# Patient Record
Sex: Male | Born: 1980 | Race: Black or African American | Hispanic: No | Marital: Single | Smoking: Never smoker
Health system: Southern US, Community
[De-identification: ages and names within clinical notes are randomized; demographics above are authoritative.]

## PROBLEM LIST (undated history)

## (undated) ENCOUNTER — Encounter

## (undated) ENCOUNTER — Telehealth: Attending: Dermatology | Primary: Dermatology

## (undated) ENCOUNTER — Telehealth

## (undated) ENCOUNTER — Ambulatory Visit: Payer: MEDICARE | Attending: Family Medicine | Primary: Family Medicine

## (undated) ENCOUNTER — Ambulatory Visit

## (undated) ENCOUNTER — Inpatient Hospital Stay

## (undated) ENCOUNTER — Telehealth: Attending: Family | Primary: Family

## (undated) ENCOUNTER — Ambulatory Visit: Payer: MEDICARE | Attending: Family | Primary: Family

## (undated) ENCOUNTER — Ambulatory Visit: Payer: MEDICARE | Attending: Dermatology | Primary: Dermatology

## (undated) ENCOUNTER — Ambulatory Visit: Payer: MEDICARE

## (undated) ENCOUNTER — Telehealth: Attending: Urology | Primary: Urology

## (undated) ENCOUNTER — Ambulatory Visit: Attending: Urology | Primary: Urology

## (undated) ENCOUNTER — Ambulatory Visit: Payer: MEDICARE | Attending: Urology | Primary: Urology

## (undated) ENCOUNTER — Ambulatory Visit: Attending: Internal Medicine | Primary: Internal Medicine

## (undated) ENCOUNTER — Encounter: Attending: Adult Health | Primary: Adult Health

## (undated) ENCOUNTER — Encounter: Attending: Family | Primary: Family

## (undated) ENCOUNTER — Ambulatory Visit: Attending: Surgery | Primary: Surgery

## (undated) ENCOUNTER — Ambulatory Visit: Payer: MEDICARE | Attending: Surgery | Primary: Surgery

## (undated) DIAGNOSIS — L0291 Cutaneous abscess, unspecified: Secondary | ICD-10-CM

## (undated) DIAGNOSIS — L732 Hidradenitis suppurativa: Secondary | ICD-10-CM

## (undated) HISTORY — PX: INCISION AND DRAINAGE: SHX5863

## (undated) HISTORY — PX: HERNIA REPAIR: SHX51

## (undated) MED ORDER — OXYCODONE 15 MG TABLET: Freq: Three times a day (TID) | ORAL | 0 days | Status: SS | PRN

## (undated) MED ORDER — OXYCODONE ER 20 MG TABLET,CRUSH RESISTANT,EXTENDED RELEASE 12 HR: Freq: Two times a day (BID) | ORAL | 0.00000 days | Status: SS

## (undated) MED ORDER — AMOXICILLIN 875 MG-POTASSIUM CLAVULANATE 125 MG TABLET
Freq: Two times a day (BID) | ORAL | 0.00000 days | Status: SS
Start: ? — End: 2020-08-23

---
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
## 2014-09-04 ENCOUNTER — Emergency Department (HOSPITAL_COMMUNITY)
Admission: EM | Admit: 2014-09-04 | Discharge: 2014-09-04 | Disposition: A | Discharge status: Home / Self Care | Payer: Self-pay | Attending: Emergency Medicine | Admitting: Emergency Medicine

## 2014-09-04 ENCOUNTER — Encounter (HOSPITAL_COMMUNITY): Payer: Self-pay | Admitting: Nurse Practitioner

## 2014-09-04 ENCOUNTER — Emergency Department (HOSPITAL_COMMUNITY): Payer: Self-pay

## 2014-09-04 DIAGNOSIS — L0231 Cutaneous abscess of buttock: Secondary | ICD-10-CM | POA: Insufficient documentation

## 2014-09-04 DIAGNOSIS — N492 Inflammatory disorders of scrotum: Secondary | ICD-10-CM | POA: Insufficient documentation

## 2014-09-04 DIAGNOSIS — L0291 Cutaneous abscess, unspecified: Secondary | ICD-10-CM

## 2014-09-04 DIAGNOSIS — L02214 Cutaneous abscess of groin: Secondary | ICD-10-CM | POA: Insufficient documentation

## 2014-09-04 LAB — I-STAT CHEM 8, ED
BUN: 12 mg/dL (ref 6–23)
CALCIUM ION: 1.21 mmol/L (ref 1.12–1.23)
Chloride: 102 mmol/L (ref 96–112)
Creatinine, Ser: 1.1 mg/dL (ref 0.50–1.35)
GLUCOSE: 92 mg/dL (ref 70–99)
HCT: 41 % (ref 39.0–52.0)
Hemoglobin: 13.9 g/dL (ref 13.0–17.0)
POTASSIUM: 3.5 mmol/L (ref 3.5–5.1)
Sodium: 142 mmol/L (ref 135–145)
TCO2: 23 mmol/L (ref 0–100)

## 2014-09-04 LAB — CBC WITH DIFFERENTIAL/PLATELET
Basophils Absolute: 0 10*3/uL (ref 0.0–0.1)
Basophils Relative: 0 % (ref 0–1)
EOS ABS: 0.1 10*3/uL (ref 0.0–0.7)
Eosinophils Relative: 2 % (ref 0–5)
HCT: 36.1 % — ABNORMAL LOW (ref 39.0–52.0)
HEMOGLOBIN: 11.2 g/dL — AB (ref 13.0–17.0)
LYMPHS ABS: 1.3 10*3/uL (ref 0.7–4.0)
LYMPHS PCT: 14 % (ref 12–46)
MCH: 21.8 pg — AB (ref 26.0–34.0)
MCHC: 31 g/dL (ref 30.0–36.0)
MCV: 70.2 fL — ABNORMAL LOW (ref 78.0–100.0)
MONOS PCT: 9 % (ref 3–12)
Monocytes Absolute: 0.8 10*3/uL (ref 0.1–1.0)
NEUTROS ABS: 7.1 10*3/uL (ref 1.7–7.7)
Neutrophils Relative %: 76 % (ref 43–77)
PLATELETS: 342 10*3/uL (ref 150–400)
RBC: 5.14 MIL/uL (ref 4.22–5.81)
RDW: 14.6 % (ref 11.5–15.5)
WBC: 9.4 10*3/uL (ref 4.0–10.5)

## 2014-09-04 MED ORDER — LORAZEPAM 2 MG/ML IJ SOLN
1.0000 mg | Freq: Once | INTRAMUSCULAR | Status: AC
  Administered 2014-09-04: 1 mg via INTRAVENOUS
  Filled 2014-09-04: qty 1

## 2014-09-04 MED ORDER — KETOROLAC TROMETHAMINE 30 MG/ML IJ SOLN
30.0000 mg | Freq: Once | INTRAMUSCULAR | Status: AC
  Administered 2014-09-04: 30 mg via INTRAVENOUS
  Filled 2014-09-04: qty 1

## 2014-09-04 MED ORDER — HYDROMORPHONE HCL 1 MG/ML IJ SOLN
1.0000 mg | Freq: Once | INTRAMUSCULAR | Status: AC
  Administered 2014-09-04: 1 mg via INTRAVENOUS
  Filled 2014-09-04: qty 1

## 2014-09-04 MED ORDER — LIDOCAINE-EPINEPHRINE (PF) 2 %-1:200000 IJ SOLN
10.0000 mL | Freq: Once | INTRAMUSCULAR | Status: AC
  Administered 2014-09-04: 10 mL
  Filled 2014-09-04: qty 20

## 2014-09-04 MED ORDER — IOHEXOL 300 MG/ML  SOLN
100.0000 mL | Freq: Once | INTRAMUSCULAR | Status: AC | PRN
  Administered 2014-09-04: 100 mL via INTRAVENOUS

## 2014-09-04 MED ORDER — OXYCODONE-ACETAMINOPHEN 5-325 MG PO TABS: 1.0000 | ORAL_TABLET | ORAL | Status: DC | PRN

## 2014-09-04 MED ORDER — HYDROMORPHONE HCL 1 MG/ML IJ SOLN
0.5000 mg | Freq: Once | INTRAMUSCULAR | Status: AC
  Administered 2014-09-04: 0.5 mg via INTRAVENOUS
  Filled 2014-09-04: qty 1

## 2014-09-04 MED ORDER — ONDANSETRON HCL 4 MG/2ML IJ SOLN
4.0000 mg | Freq: Once | INTRAMUSCULAR | Status: AC
  Administered 2014-09-04: 4 mg via INTRAVENOUS
  Filled 2014-09-04: qty 2

## 2014-09-04 MED ORDER — DOXYCYCLINE HYCLATE 100 MG PO CAPS: 100.0000 mg | ORAL_CAPSULE | Freq: Two times a day (BID) | ORAL | Status: DC

## 2014-09-04 NOTE — ED Provider Notes (Signed)
CSN: [id number]     Arrival date & time 09/04/14  1408 History   First MD Initiated Contact with Patient 09/04/14 1442     Chief Complaint  Patient presents with  . Wound Infection     (Consider location/radiation/quality/duration/timing/severity/associated sxs/prior Treatment) HPI Comments: Patient here with multiple abscesses in his left groin and left buttock which is been draining for the past 2 weeks and worse over the last day. Denies any fever or chills. No vomiting. Has self treated himself with soaks in his bathtub without relief. Has had sharp pain is worse with movement and better with rest at these areas. Is not on antibiotics currently at this time.  The history is provided by the patient.    History reviewed. No pertinent past medical history. History reviewed. No pertinent past surgical history. History reviewed. No pertinent family history. History  Substance Use Topics  . Smoking status: Never Smoker   . Smokeless tobacco: Not on file  . Alcohol Use: No    Review of Systems  All other systems reviewed and are negative.     Allergies  Review of patient's allergies indicates no known allergies.  Home Medications   Prior to Admission medications   Not on File   BP 136/88 mmHg  Pulse 74  Temp(Src) 97.6 F (36.4 C) (Oral)  Resp 20  Ht  (1.778 m)  Wt 210 lb (95.255 kg)  BMI 30.13 kg/m2  SpO2 100% Physical Exam  Constitutional: He is oriented to person, place, and time. He appears well-developed and well-nourished.  Non-toxic appearance. No distress.  HENT:  Head: Normocephalic and atraumatic.  Eyes: Conjunctivae, EOM and lids are normal. Pupils are equal, round, and reactive to light.  Neck: Normal range of motion. Neck supple. No tracheal deviation present. No thyroid mass present.  Cardiovascular: Normal rate, regular rhythm and normal heart sounds.  Exam reveals no gallop.   No murmur heard. Pulmonary/Chest: Effort normal and breath sounds  normal. No stridor. No respiratory distress. He has no decreased breath sounds. He has no wheezes. He has no rhonchi. He has no rales.  Abdominal: Soft. Normal appearance and bowel sounds are normal. He exhibits no distension. There is no tenderness. There is no rebound and no CVA tenderness.  Genitourinary:        Musculoskeletal: Normal range of motion. He exhibits no edema or tenderness.  Neurological: He is alert and oriented to person, place, and time. He has normal strength. No cranial nerve deficit or sensory deficit. GCS eye subscore is 4. GCS verbal subscore is 5. GCS motor subscore is 6.  Skin: Skin is warm and dry. No abrasion and no rash noted.  Psychiatric: He has a normal mood and affect. His speech is normal and behavior is normal.  Nursing note and vitals reviewed.   ED Course  Procedures (including critical care time) Labs Review Labs Reviewed  CBC WITH DIFFERENTIAL/PLATELET - Abnormal; Notable for the following:    Hemoglobin 11.2 (*)    HCT 36.1 (*)    MCV 70.2 (*)    MCH 21.8 (*)    All other components within normal limits  I-STAT CHEM 8, ED    Imaging Review No results found.   EKG Interpretation None      MDM   Final diagnoses:  None   Wounds drained by resident please see his note. Will place on antibiotics and give prescription for pain medication due to cellulitis that is present as well 2.  Toy BakerAnthony T Mekhia Brogan, MD 09/04/14 2227

## 2014-09-04 NOTE — Discharge Instructions (Signed)

## 2014-09-04 NOTE — ED Notes (Signed)
He states he has an abscess on his buttock and groin that are painful and draining fluid this week

## 2014-09-04 NOTE — ED Provider Notes (Signed)
Medical screening exam in [facility].    Dan Koch is a 34 y.o. male with a h/o abscess who presents to the Emergency Department complaining of "boils". He states the first boil to his buttocks went away and  returned 2 weeks ago. He reports 10/10 sharp pain and states it is draining pus and blood from the site. He notes the draining has provided mild relief. The second boil to his groin appeared last night and has gotten bigger since. He states it feels the same as the first. He denies fever, chills, dysuria, penile discharge and penile pain, changes in BM and blood in his stool.   Brief Physical Exam: Patient with multiple large abscess that appear to be continuous starting in the left buttock and track through to the left groin and left scrotum.  Multiple areas draining pus.    Pt will definitely need higher level of care that we are able to provide in fast track.  Moved to main ED for CT pelvis with contrast, further evaluation and treatment.  Labs, IV pain and nausea medications ordered.     Trixie Dredgemily Avrianna Smart, PA-C 09/04/14 1547  Tilden FossaElizabeth Rees, MD 09/04/14 1740

## 2014-09-04 NOTE — ED Notes (Signed)
Physician at the bedside performing I&D.

## 2014-09-07 ENCOUNTER — Telehealth: Payer: Self-pay | Admitting: *Deleted

## 2014-09-07 NOTE — Telephone Encounter (Signed)
Pt called stating prescription for doxycycline (VIBRAMYCIN) 100 MG capsule was too expensive.  NCM searched www.goodrx.com to get coupon for $18.85 and text to pt phone.  Pt appreciative.

## 2014-09-14 ENCOUNTER — Emergency Department (HOSPITAL_COMMUNITY)
Admission: EM | Admit: 2014-09-14 | Discharge: 2014-09-14 | Disposition: A | Discharge status: Home / Self Care | Payer: No Typology Code available for payment source | Attending: Emergency Medicine | Admitting: Emergency Medicine

## 2014-09-14 ENCOUNTER — Emergency Department (HOSPITAL_COMMUNITY): Payer: No Typology Code available for payment source

## 2014-09-14 ENCOUNTER — Encounter (HOSPITAL_COMMUNITY): Payer: Self-pay

## 2014-09-14 DIAGNOSIS — S4991XA Unspecified injury of right shoulder and upper arm, initial encounter: Secondary | ICD-10-CM | POA: Insufficient documentation

## 2014-09-14 DIAGNOSIS — Y998 Other external cause status: Secondary | ICD-10-CM | POA: Diagnosis not present

## 2014-09-14 DIAGNOSIS — S0990XA Unspecified injury of head, initial encounter: Secondary | ICD-10-CM | POA: Insufficient documentation

## 2014-09-14 DIAGNOSIS — Y92410 Unspecified street and highway as the place of occurrence of the external cause: Secondary | ICD-10-CM | POA: Diagnosis not present

## 2014-09-14 DIAGNOSIS — Z793 Long term (current) use of hormonal contraceptives: Secondary | ICD-10-CM | POA: Diagnosis not present

## 2014-09-14 DIAGNOSIS — S3992XA Unspecified injury of lower back, initial encounter: Secondary | ICD-10-CM | POA: Insufficient documentation

## 2014-09-14 DIAGNOSIS — S6991XA Unspecified injury of right wrist, hand and finger(s), initial encounter: Secondary | ICD-10-CM | POA: Insufficient documentation

## 2014-09-14 DIAGNOSIS — Y9389 Activity, other specified: Secondary | ICD-10-CM | POA: Diagnosis not present

## 2014-09-14 DIAGNOSIS — S8991XA Unspecified injury of right lower leg, initial encounter: Secondary | ICD-10-CM | POA: Insufficient documentation

## 2014-09-14 MED ORDER — ONDANSETRON 4 MG PO TBDP
8.0000 mg | ORAL_TABLET | Freq: Once | ORAL | Status: AC
  Administered 2014-09-14: 8 mg via ORAL
  Filled 2014-09-14: qty 2

## 2014-09-14 MED ORDER — OXYCODONE-ACETAMINOPHEN 5-325 MG PO TABS
2.0000 | ORAL_TABLET | Freq: Once | ORAL | Status: AC
  Administered 2014-09-14: 2 via ORAL
  Filled 2014-09-14: qty 2

## 2014-09-14 MED ORDER — OXYCODONE-ACETAMINOPHEN 5-325 MG PO TABS: 2.0000 | ORAL_TABLET | Freq: Four times a day (QID) | ORAL | Status: DC | PRN

## 2014-09-14 NOTE — Discharge Instructions (Signed)
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
## 2014-09-15 ENCOUNTER — Emergency Department (HOSPITAL_COMMUNITY)
Admission: EM | Admit: 2014-09-15 | Discharge: 2014-09-15 | Disposition: A | Discharge status: Home / Self Care | Payer: No Typology Code available for payment source | Attending: Emergency Medicine | Admitting: Emergency Medicine

## 2014-09-15 ENCOUNTER — Encounter (HOSPITAL_COMMUNITY): Payer: Self-pay | Admitting: *Deleted

## 2014-09-15 DIAGNOSIS — G44209 Tension-type headache, unspecified, not intractable: Secondary | ICD-10-CM | POA: Diagnosis not present

## 2014-09-15 DIAGNOSIS — Z792 Long term (current) use of antibiotics: Secondary | ICD-10-CM | POA: Insufficient documentation

## 2014-09-15 DIAGNOSIS — M549 Dorsalgia, unspecified: Secondary | ICD-10-CM | POA: Diagnosis not present

## 2014-09-15 DIAGNOSIS — M542 Cervicalgia: Secondary | ICD-10-CM

## 2014-09-15 MED ORDER — TRAMADOL HCL 50 MG PO TABS: 50.0000 mg | ORAL_TABLET | Freq: Four times a day (QID) | ORAL | Status: DC | PRN

## 2014-09-15 MED ORDER — METHOCARBAMOL 500 MG PO TABS
1000.0000 mg | ORAL_TABLET | Freq: Once | ORAL | Status: AC
  Administered 2014-09-15: 1000 mg via ORAL
  Filled 2014-09-15: qty 2

## 2014-09-15 MED ORDER — OXYCODONE-ACETAMINOPHEN 5-325 MG PO TABS
1.0000 | ORAL_TABLET | Freq: Once | ORAL | Status: AC
  Administered 2014-09-15: 1 via ORAL
  Filled 2014-09-15: qty 1

## 2014-09-15 MED ORDER — NAPROXEN 500 MG PO TABS: 500.0000 mg | ORAL_TABLET | Freq: Two times a day (BID) | ORAL | Status: DC

## 2014-09-15 MED ORDER — NAPROXEN 250 MG PO TABS
500.0000 mg | ORAL_TABLET | Freq: Two times a day (BID) | ORAL | Status: DC
  Administered 2014-09-15: 500 mg via ORAL
  Filled 2014-09-15: qty 2

## 2014-09-15 MED ORDER — METHOCARBAMOL 750 MG PO TABS: 750.0000 mg | ORAL_TABLET | Freq: Four times a day (QID) | ORAL | Status: DC | PRN
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
## 2014-09-15 NOTE — ED Notes (Signed)
Pt a/o x 4 on d/c with steady gait. Pt refused wheelchair. 
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
## 2014-09-17 ENCOUNTER — Encounter (HOSPITAL_COMMUNITY): Payer: Self-pay | Admitting: Emergency Medicine

## 2014-09-17 ENCOUNTER — Emergency Department (HOSPITAL_COMMUNITY)
Admission: EM | Admit: 2014-09-17 | Discharge: 2014-09-17 | Disposition: A | Discharge status: Home / Self Care | Payer: No Typology Code available for payment source | Attending: Emergency Medicine | Admitting: Emergency Medicine

## 2014-09-17 DIAGNOSIS — M549 Dorsalgia, unspecified: Secondary | ICD-10-CM | POA: Insufficient documentation

## 2014-09-17 DIAGNOSIS — R51 Headache: Secondary | ICD-10-CM | POA: Insufficient documentation

## 2014-09-17 DIAGNOSIS — G8911 Acute pain due to trauma: Secondary | ICD-10-CM | POA: Insufficient documentation

## 2014-09-17 DIAGNOSIS — M25511 Pain in right shoulder: Secondary | ICD-10-CM | POA: Insufficient documentation

## 2014-09-17 DIAGNOSIS — M542 Cervicalgia: Secondary | ICD-10-CM | POA: Diagnosis present

## 2014-09-17 DIAGNOSIS — S161XXD Strain of muscle, fascia and tendon at neck level, subsequent encounter: Secondary | ICD-10-CM | POA: Diagnosis not present

## 2014-09-17 DIAGNOSIS — Z792 Long term (current) use of antibiotics: Secondary | ICD-10-CM | POA: Insufficient documentation

## 2014-09-17 MED ORDER — IBUPROFEN 800 MG PO TABS: 800.0000 mg | ORAL_TABLET | Freq: Three times a day (TID) | ORAL | Status: DC

## 2014-09-17 MED ORDER — BUPIVACAINE HCL (PF) 0.5 % IJ SOLN
5.0000 mL | Freq: Once | INTRAMUSCULAR | Status: DC
Start: 2014-09-17 — End: 2014-09-17
  Filled 2014-09-17: qty 10

## 2014-09-17 MED ORDER — LIDOCAINE HCL 2 % IJ SOLN
5.0000 mL | Freq: Once | INTRAMUSCULAR | Status: DC
  Filled 2014-09-17: qty 20

## 2014-09-17 MED ORDER — IBUPROFEN 400 MG PO TABS
800.0000 mg | ORAL_TABLET | Freq: Once | ORAL | Status: AC
  Administered 2014-09-17: 800 mg via ORAL
  Filled 2014-09-17: qty 2

## 2014-09-17 NOTE — Discharge Instructions (Signed)
Cervical Strain and Sprain (Whiplash) with Rehab Cervical strain and sprain are injuries that commonly occur with "whiplash" injuries. Whiplash occurs when the neck is forcefully whipped backward or forward, such as during a motor vehicle accident or during contact sports. The muscles, ligaments, tendons, discs, and nerves of the neck are susceptible to injury when this occurs. RISK FACTORS Risk of having a whiplash injury increases if:  Osteoarthritis of the spine.  Situations that make head or neck accidents or trauma more likely.  High-risk sports (football, rugby, wrestling, hockey, auto racing, gymnastics, diving, contact karate, or boxing).  Poor strength and flexibility of the neck.  Previous neck injury.  Poor tackling technique.  Improperly fitted or padded equipment. SYMPTOMS   Pain or stiffness in the front or back of neck or both.  Symptoms may present immediately or up to 24 hours after injury.  Dizziness, headache, nausea, and vomiting.  Muscle spasm with soreness and stiffness in the neck.  Tenderness and swelling at the injury site. PREVENTION  Learn and use proper technique (avoid tackling with the head, spearing, and head-butting; use proper falling techniques to avoid landing on the head).  Warm up and stretch properly before activity.  Maintain physical fitness:  Strength, flexibility, and endurance.  Cardiovascular fitness.  Wear properly fitted and padded protective equipment, such as padded soft collars, for participation in contact sports. PROGNOSIS  Recovery from cervical strain and sprain injuries is dependent on the extent of the injury. These injuries are usually curable in 1 week to 3 months with appropriate treatment.  RELATED COMPLICATIONS   Temporary numbness and weakness may occur if the nerve roots are damaged, and this may persist until the nerve has completely healed.  Chronic pain due to frequent recurrence of  symptoms.  Prolonged healing, especially if activity is resumed too soon (before complete recovery). TREATMENT  Treatment initially involves the use of ice and medication to help reduce pain and inflammation. It is also important to perform strengthening and stretching exercises and modify activities that worsen symptoms so the injury does not get worse. These exercises may be performed at home or with a therapist. For patients who experience severe symptoms, a soft, padded collar may be recommended to be worn around the neck.  Improving your posture may help reduce symptoms. Posture improvement includes pulling your chin and abdomen in while sitting or standing. If you are sitting, sit in a firm chair with your buttocks against the back of the chair. While sleeping, try replacing your pillow with a small towel rolled to 2 inches in diameter, or use a cervical pillow or soft cervical collar. Poor sleeping positions delay healing.  For patients with nerve root damage, which causes numbness or weakness, the use of a cervical traction apparatus may be recommended. Surgery is rarely necessary for these injuries. However, cervical strain and sprains that are present at birth (congenital) may require surgery. MEDICATION   If pain medication is necessary, nonsteroidal anti-inflammatory medications, such as aspirin and ibuprofen, or other minor pain relievers, such as acetaminophen, are often recommended.  Do not take pain medication for 7 days before surgery.  Prescription pain relievers may be given if deemed necessary by your caregiver. Use only as directed and only as much as you need. HEAT AND COLD:   Cold treatment (icing) relieves pain and reduces inflammation. Cold treatment should be applied for 10 to 15 minutes every 2 to 3 hours for inflammation and pain and immediately after any activity that aggravates   your symptoms. Use ice packs or an ice massage.  Heat treatment may be used prior to  performing the stretching and strengthening activities prescribed by your caregiver, physical therapist, or athletic trainer. Use a heat pack or a warm soak. SEEK MEDICAL CARE IF:   Symptoms get worse or do not improve in 2 weeks despite treatment.  New, unexplained symptoms develop (drugs used in treatment may produce side effects). EXERCISES RANGE OF MOTION (ROM) AND STRETCHING EXERCISES - Cervical Strain and Sprain These exercises may help you when beginning to rehabilitate your injury. In order to successfully resolve your symptoms, you must improve your posture. These exercises are designed to help reduce the forward-head and rounded-shoulder posture which contributes to this condition. Your symptoms may resolve with or without further involvement from your physician, physical therapist or athletic trainer. While completing these exercises, remember:   Restoring tissue flexibility helps normal motion to return to the joints. This allows healthier, less painful movement and activity.  An effective stretch should be held for at least 20 seconds, although you may need to begin with shorter hold times for comfort.  A stretch should never be painful. You should only feel a gentle lengthening or release in the stretched tissue. STRETCH- Axial Extensors  Lie on your back on the floor. You may bend your knees for comfort. Place a rolled-up hand towel or dish towel, about 2 inches in diameter, under the part of your head that makes contact with the floor.  Gently tuck your chin, as if trying to make a "double chin," until you feel a gentle stretch at the base of your head.  Hold __________ seconds. Repeat __________ times. Complete this exercise __________ times per day.  STRETCH - Axial Extension   Stand or sit on a firm surface. Assume a good posture: chest up, shoulders drawn back, abdominal muscles slightly tense, knees unlocked (if standing) and feet hip width apart.  Slowly retract your  chin so your head slides back and your chin slightly lowers. Continue to look straight ahead.  You should feel a gentle stretch in the back of your head. Be certain not to feel an aggressive stretch since this can cause headaches later.  Hold for __________ seconds. Repeat __________ times. Complete this exercise __________ times per day. STRETCH - Cervical Side Bend   Stand or sit on a firm surface. Assume a good posture: chest up, shoulders drawn back, abdominal muscles slightly tense, knees unlocked (if standing) and feet hip width apart.  Without letting your nose or shoulders move, slowly tip your right / left ear to your shoulder until your feel a gentle stretch in the muscles on the opposite side of your neck.  Hold __________ seconds. Repeat __________ times. Complete this exercise __________ times per day. STRETCH - Cervical Rotators   Stand or sit on a firm surface. Assume a good posture: chest up, shoulders drawn back, abdominal muscles slightly tense, knees unlocked (if standing) and feet hip width apart.  Keeping your eyes level with the ground, slowly turn your head until you feel a gentle stretch along the back and opposite side of your neck.  Hold __________ seconds. Repeat __________ times. Complete this exercise __________ times per day. RANGE OF MOTION - Neck Circles   Stand or sit on a firm surface. Assume a good posture: chest up, shoulders drawn back, abdominal muscles slightly tense, knees unlocked (if standing) and feet hip width apart.  Gently roll your head down and around from the   back of one shoulder to the back of the other. The motion should never be forced or painful.  Repeat the motion 10-20 times, or until you feel the neck muscles relax and loosen. Repeat __________ times. Complete the exercise __________ times per day. STRENGTHENING EXERCISES - Cervical Strain and Sprain These exercises may help you when beginning to rehabilitate your injury. They may  resolve your symptoms with or without further involvement from your physician, physical therapist, or athletic trainer. While completing these exercises, remember:   Muscles can gain both the endurance and the strength needed for everyday activities through controlled exercises.  Complete these exercises as instructed by your physician, physical therapist, or athletic trainer. Progress the resistance and repetitions only as guided.  You may experience muscle soreness or fatigue, but the pain or discomfort you are trying to eliminate should never worsen during these exercises. If this pain does worsen, stop and make certain you are following the directions exactly. If the pain is still present after adjustments, discontinue the exercise until you can discuss the trouble with your clinician. STRENGTH - Cervical Flexors, Isometric  Face a wall, standing about 6 inches away. Place a small pillow, a ball about 6-8 inches in diameter, or a folded towel between your forehead and the wall.  Slightly tuck your chin and gently push your forehead into the soft object. Push only with mild to moderate intensity, building up tension gradually. Keep your jaw and forehead relaxed.  Hold 10 to 20 seconds. Keep your breathing relaxed.  Release the tension slowly. Relax your neck muscles completely before you start the next repetition. Repeat __________ times. Complete this exercise __________ times per day. STRENGTH- Cervical Lateral Flexors, Isometric   Stand about 6 inches away from a wall. Place a small pillow, a ball about 6-8 inches in diameter, or a folded towel between the side of your head and the wall.  Slightly tuck your chin and gently tilt your head into the soft object. Push only with mild to moderate intensity, building up tension gradually. Keep your jaw and forehead relaxed.  Hold 10 to 20 seconds. Keep your breathing relaxed.  Release the tension slowly. Relax your neck muscles completely  before you start the next repetition. Repeat __________ times. Complete this exercise __________ times per day. STRENGTH - Cervical Extensors, Isometric   Stand about 6 inches away from a wall. Place a small pillow, a ball about 6-8 inches in diameter, or a folded towel between the back of your head and the wall.  Slightly tuck your chin and gently tilt your head back into the soft object. Push only with mild to moderate intensity, building up tension gradually. Keep your jaw and forehead relaxed.  Hold 10 to 20 seconds. Keep your breathing relaxed.  Release the tension slowly. Relax your neck muscles completely before you start the next repetition. Repeat __________ times. Complete this exercise __________ times per day. POSTURE AND BODY MECHANICS CONSIDERATIONS - Cervical Strain and Sprain Keeping correct posture when sitting, standing or completing your activities will reduce the stress put on different body tissues, allowing injured tissues a chance to heal and limiting painful experiences. The following are general guidelines for improved posture. Your physician or physical therapist will provide you with any instructions specific to your needs. While reading these guidelines, remember:  The exercises prescribed by your provider will help you have the flexibility and strength to maintain correct postures.  The correct posture provides the optimal environment for your joints to   work. All of your joints have less wear and tear when properly supported by a spine with good posture. This means you will experience a healthier, less painful body.  Correct posture must be practiced with all of your activities, especially prolonged sitting and standing. Correct posture is as important when doing repetitive low-stress activities (typing) as it is when doing a single heavy-load activity (lifting). PROLONGED STANDING WHILE SLIGHTLY LEANING FORWARD When completing a task that requires you to lean  forward while standing in one place for a long time, place either foot up on a stationary 2- to 4-inch high object to help maintain the best posture. When both feet are on the ground, the low back tends to lose its slight inward curve. If this curve flattens (or becomes too large), then the back and your other joints will experience too much stress, fatigue more quickly, and can cause pain.  RESTING POSITIONS Consider which positions are most painful for you when choosing a resting position. If you have pain with flexion-based activities (sitting, bending, stooping, squatting), choose a position that allows you to rest in a less flexed posture. You would want to avoid curling into a fetal position on your side. If your pain worsens with extension-based activities (prolonged standing, working overhead), avoid resting in an extended position such as sleeping on your stomach. Most people will find more comfort when they rest with their spine in a more neutral position, neither too rounded nor too arched. Lying on a non-sagging bed on your side with a pillow between your knees, or on your back with a pillow under your knees will often provide some relief. Keep in mind, being in any one position for a prolonged period of time, no matter how correct your posture, can still lead to stiffness. WALKING Walk with an upright posture. Your ears, shoulders, and hips should all line up. OFFICE WORK When working at a desk, create an environment that supports good, upright posture. Without extra support, muscles fatigue and lead to excessive strain on joints and other tissues. CHAIR:  A chair should be able to slide under your desk when your back makes contact with the back of the chair. This allows you to work closely.  The chair's height should allow your eyes to be level with the upper part of your monitor and your hands to be slightly lower than your elbows.  Body position:  Your feet should make contact with the  floor. If this is not possible, use a foot rest.  Keep your ears over your shoulders. This will reduce stress on your neck and low back. Document Released: 07/09/2005 Document Revised: 11/23/2013 Document Reviewed: 10/21/2008 [employer] Patient Information 2015 [employer]. This information is not intended to replace advice given to you by your health care provider. Make sure you discuss any questions you have with your health care provider.  
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
## 2014-10-01 ENCOUNTER — Encounter (HOSPITAL_COMMUNITY): Payer: Self-pay | Admitting: Cardiology

## 2014-10-01 ENCOUNTER — Emergency Department (HOSPITAL_COMMUNITY): Payer: No Typology Code available for payment source

## 2014-10-01 ENCOUNTER — Emergency Department (HOSPITAL_COMMUNITY)
Admission: EM | Admit: 2014-10-01 | Discharge: 2014-10-01 | Disposition: A | Discharge status: Home / Self Care | Payer: No Typology Code available for payment source | Attending: Emergency Medicine | Admitting: Emergency Medicine

## 2014-10-01 DIAGNOSIS — Z79899 Other long term (current) drug therapy: Secondary | ICD-10-CM | POA: Diagnosis not present

## 2014-10-01 DIAGNOSIS — Z791 Long term (current) use of non-steroidal anti-inflammatories (NSAID): Secondary | ICD-10-CM | POA: Diagnosis not present

## 2014-10-01 DIAGNOSIS — M549 Dorsalgia, unspecified: Secondary | ICD-10-CM

## 2014-10-01 DIAGNOSIS — S3992XA Unspecified injury of lower back, initial encounter: Secondary | ICD-10-CM | POA: Diagnosis present

## 2014-10-01 DIAGNOSIS — S39012A Strain of muscle, fascia and tendon of lower back, initial encounter: Secondary | ICD-10-CM | POA: Insufficient documentation

## 2014-10-01 DIAGNOSIS — Y9389 Activity, other specified: Secondary | ICD-10-CM | POA: Insufficient documentation

## 2014-10-01 DIAGNOSIS — Y998 Other external cause status: Secondary | ICD-10-CM | POA: Diagnosis not present

## 2014-10-01 DIAGNOSIS — Y92410 Unspecified street and highway as the place of occurrence of the external cause: Secondary | ICD-10-CM | POA: Insufficient documentation

## 2014-10-01 DIAGNOSIS — Z792 Long term (current) use of antibiotics: Secondary | ICD-10-CM | POA: Insufficient documentation

## 2014-10-01 MED ORDER — ACETAMINOPHEN 325 MG PO TABS: 325.0000 mg | ORAL_TABLET | Freq: Four times a day (QID) | ORAL | Status: DC | PRN
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
## 2014-10-01 NOTE — Discharge Instructions (Signed)
Please call your doctor for a followup appointment within 24-48 hours. When you talk to your doctor please let them know that you were seen in the emergency department and have them acquire all of your records so that they can discuss the findings with you and formulate a treatment plan to fully care for your new and ongoing problems. Please follow-up with health and wellness Center Please follow-up with orthopedics Please rest and stay hydrated Please apply warm compressions and massage with icy hot ointment Please avoid any physical strenuous activity, please no heavy lifting Please continue to monitor symptoms closely and if symptoms are to worsen or change (fever greater than 101, chills, sweating, nausea, vomiting, chest pain, shortness of breathe, difficulty breathing, weakness, numbness, tingling, worsening or changes to pain pattern, fall, injury, loss of sensation, inability to control urine or bowel movements-urinating or having bowel movements on oneself) please report back to the Emergency Department immediately.   Lumbosacral Strain Lumbosacral strain is a strain of any of the parts that make up your lumbosacral vertebrae. Your lumbosacral vertebrae are the bones that make up the lower third of your backbone. Your lumbosacral vertebrae are held together by muscles and tough, fibrous tissue (ligaments).  CAUSES  A sudden blow to your back can cause lumbosacral strain. Also, anything that causes an excessive stretch of the muscles in the low back can cause this strain. This is typically seen when people exert themselves strenuously, fall, lift heavy objects, bend, or crouch repeatedly. RISK FACTORS  Physically demanding work.  Participation in pushing or pulling sports or sports that require a sudden twist of the back (tennis, golf, baseball).  Weight lifting.  Excessive lower back curvature.  Forward-tilted pelvis.  Weak back or abdominal muscles or both.  Tight  hamstrings. SIGNS AND SYMPTOMS  Lumbosacral strain may cause pain in the area of your injury or pain that moves (radiates) down your leg.  DIAGNOSIS Your health care provider can often diagnose lumbosacral strain through a physical exam. In some cases, you may need tests such as X-ray exams.  TREATMENT  Treatment for your lower back injury depends on many factors that your clinician will have to evaluate. However, most treatment will include the use of anti-inflammatory medicines. HOME CARE INSTRUCTIONS   Avoid hard physical activities (tennis, racquetball, waterskiing) if you are not in proper physical condition for it. This may aggravate or create problems.  If you have a back problem, avoid sports requiring sudden body movements. Swimming and walking are generally safer activities.  Maintain good posture.  Maintain a healthy weight.  For acute conditions, you may put ice on the injured area.  Put ice in a plastic bag.  Place a towel between your skin and the bag.  Leave the ice on for 20 minutes, 2-3 times a day.  When the low back starts healing, stretching and strengthening exercises may be recommended. SEEK MEDICAL CARE IF:  Your back pain is getting worse.  You experience severe back pain not relieved with medicines. SEEK IMMEDIATE MEDICAL CARE IF:   You have numbness, tingling, weakness, or problems with the use of your arms or legs.  There is a change in bowel or bladder control.  You have increasing pain in any area of the body, including your belly (abdomen).  You notice shortness of breath, dizziness, or feel faint.  You feel sick to your stomach (nauseous), are throwing up (vomiting), or become sweaty.  You notice discoloration of your toes or legs, or your  feet get very cold. MAKE SURE YOU:   Understand these instructions.  Will watch your condition.  Will get help right away if you are not doing well or get worse. Document Released: 04/18/2005  Document Revised: 07/14/2013 Document Reviewed: 02/25/2013 [facility] Patient Information 2015 [place], [place]. This information is not intended to replace advice given to you by your health care provider. Make sure you discuss any questions you have with your health care provider.   Emergency Department Resource Guide 1) Find a Doctor and Pay Out of Pocket Although you won't have to find out who is covered by your insurance plan, it is a good idea to ask around and get recommendations. You will then need to call the office and see if the doctor you have chosen will accept you as a new patient and what types of options they offer for patients who are self-pay. Some doctors offer discounts or will set up payment plans for their patients who do not have insurance, but you will need to ask so you aren't surprised when you get to your appointment.  2) Contact Your Local Health Department Not all health departments have doctors that can see patients for sick visits, but many do, so it is worth a call to see if yours does. If you don't know where your local health department is, you can check in your phone book. The CDC also has a tool to help you locate your state's health department, and many state websites also have listings of all of their local health departments.  3) Find a Walk-in Clinic If your illness is not likely to be very severe or complicated, you may want to try a walk in clinic. These are popping up all over the country in pharmacies, drugstores, and shopping centers. They're usually staffed by nurse practitioners or physician assistants that have been trained to treat common illnesses and complaints. They're usually fairly quick and inexpensive. However, if you have serious medical issues or chronic medical problems, these are probably not your best option.  No Primary Care Doctor: - Call Health Connect at  [phone] - they can help you locate a primary care doctor that  accepts your  insurance, provides certain services, etc. - Physician Referral Service- [phone]  Chronic Pain Problems: Organization         Address  Phone   Notes  [facility]  [phone] Patients need to be referred by their primary care doctor.   Medication Assistance: Organization         Address  Phone   Notes  [facility] [address] [place] [place], [place] [address] [phone] --Must be a resident of [facility] -- Must have NO insurance coverage whatsoever (no Medicaid/ Medicare, etc.) -- The pt. MUST have a primary care doctor that directs their care regularly and follows them in the community   [facility]ist  [phone]   [employer]  [phone]    Agencies that provide inexpensive medical care: Organization         Address  Phone   Notes  [facility]  [phone]   [facility]    [phone]   [facility] [address] [place], [place] [address] [phone]   [facility] [address] [place], [place] [phone]   Planned Parenthood    [phone]   [facility]    [phone])  [phone]   [facility]  [address], [place] Phone:  [phone](336) [phone], Fax:  [phone] Hours of Operation:  9 am - 6 pm, M-F.  Also accepts Medicaid/Medicare and self-pay.  [facility]  [address], [place] Phone: [phone](336) [phone], Fax: [phone]. Hours of Operation:  8:30 am - 5:30 pm, M-F.  Also accepts Medicaid and self-pay.  [facility] [place] [address], [place] Phone: [phone](336) [phone]   [facility] [address], [place], [place] [phone], Ext. [phone] Mondays & Thursdays: 7-9 AM.  First 15 patients are seen on a first come, first serve basis.    Medicaid-accepting [facility] Providers:  Organization          Address  Phone   Notes  [facility] [address], [place] [phone] Also accepts self-pay patients.  [facility] [address], [place]  [phone](336) [phone]   [facility] [address], [place] [phone]) [phone]   [facility] [address] [place] [address], [place] [phone]) [phone]   Renaye RakersVeita Bland [address], [place]   [phone](336) [phone] Only accepts [place] Access [place] patients after they have their name applied to their card.   Self-Pay (no insurance) in [facility]:  Organization         Address  Phone   Notes  Sickle Cell Patients, [facility] [address], [place] [phone](336) [phone]   [facility] [address], [place] [phone]) [phone]   [facility] [facility]  [address], [place] [phone]) [phone]   [facility]/Dr. Osei-Bonsu  [address] [place] [address], [place] or [phone], [place] [phone] Phone number for both [place] and [place] locations is the same.  [facility] [address], [place] [phone]) [phone]   [facility] [place] [address] [place] [address], [place] or [address] [phone](336) [phone]   [facility] [address], [place] [phone]) [phone], phone; [phone](336) [phone], fax Sees patients 1st and 3rd Saturday of every month.  Must not qualify for public or private insurance (i.e. Medicaid, Medicare, [place] Health Choice, Veterans' Benefits)  Household income should be no more than 200% of the poverty level The clinic cannot treat you if you are pregnant or think you are pregnant  Sexually transmitted diseases are not treated at the clinic.    Dental Care: Organization         Address  Phone  Notes  [facility] [address],  [place] [phone]) [phone] Accepts children up to age 33 who are enrolled in [place] or [place] Health Choice; pregnant women with a Medicaid card; and children who have applied for Medicaid or [place] Health Choice, but were declined, whose parents can pay a reduced fee at time of service.  [facility] of [facility] [place]  [address], [place] [phone]) [phone] Accepts children up to age 33 who are enrolled in [place] or [place] Health Choice; pregnant women with a Medicaid card; and children who have applied for Medicaid or  Health Choice, but were declined, whose parents can pay a reduced fee at time of service.  [facility]  [address] [place], [place] [phone] Patients are seen by appointment only. Walk-ins are not accepted. [facility] will see patients 44 years of age and older. Monday - Tuesday (8am-5pm) Most Wednesdays (8:30-5pm) $30 per visit, cash only  [facility] Adult Dental Access PROGRAM  [address], [facility] [phone] Patients are seen by appointment only. Walk-ins are not accepted. [facility] will see patients 71 years of age and older. One Wednesday Evening (Monthly: Volunteer Based).  $30 per visit, cash only  [employer]  [phone] for adults; Children under age 19, call Graduate Pediatric Dentistry at [phone]. Children aged 54-14, please call [phone] to request a pediatric application.  Dental services are provided in all areas of dental care including fillings, crowns and bridges, complete and partial dentures, implants, gum treatment, root canals, and extractions. Preventive care is also provided. Treatment is provided to both adults and children. Patients are selected via a lottery and there is often a waiting list.   [facility] [address] [place]  [phone] [email or web address]   [facility] [address] [place], [place]  [phone], Ext. [phone] Second and Fourth Thursday of each month, opens at 6:30 AM; Clinic ends at 9 AM.  Patients are seen on a first-come first-served basis, and a limited number are seen during each clinic.   [facility]  [address] [place], [place] [phone]   Eligibility Requirements You must have lived in [place], [place], or [place] counties for at least the last three months.   You cannot be eligible for state or federal sponsored [place], including [employer], [place], or [employer].   You generally cannot be eligible for healthcare insurance through your employer.    How to apply: Eligibility screenings are held every Tuesday and Wednesday afternoon from 1:00 pm until 4:00 pm. You do not need an appointment for the interview!  [facility] [address] [place], [place] [phone]   [facility] Health Department  [phone]   [facility]  [phone]   [facility] Health Department  [phone]    Behavioral Health Resources in the Community: Intensive Outpatient Programs Organization         Address  Phone  Notes  [facility] [address], [place], [place] [phone]   [facility] [address], [place], [place] [phone]   [facility] [address] [place], [place]  [phone]   [facility] Mental Health [address]  [place], [place] [phone] or [phone]   Substance Abuse Resources Organization         Address  Phone  Notes  [facility] Drug Services  [phone]   [facility]  [phone]   [facility] [place]  [phone]   Floydene Flock  [phone]   Residential & Outpatient Substance Abuse Program  [phone]   Psychological Services Organization         Address  Phone  Notes  [facility]  [phone]   [facility]  [phone]    [facility] Mental Health [address], [place] [phone] or [phone]    Mobile Crisis Teams Organization         Address  Phone  Notes  [facility], Mobile Crisis Care Unit  [phone]   [facility]  [address] [place], [place]  [phone]   [facility] [address] [place] [phone]    Self-Help/Support Groups Organization         Address  Phone             Notes  [facility] [place] - variety of support groups  [phone]- I7437963 Call for more information  Narcotics Anonymous (NA), [facility] [address], [employer] [place]  2 meetings at this location   Statistician         Address  Phone  Notes  [facility] [address],    [place]  [phone]   [facility]  [address] [place] [address], [place], [place] [phone]   [facility] [address] [place], [place] [phone] Admissions: 8am-3pm M-F  [facility] [address],    [place], [place] [phone]   [facility] [address] [place], [place], [place] [phone]   [facility] [address],  [place], [place] [phone]   [facility] - Intensive Outpatient [address], [place], [place] [phone]   [facility] ([facility]iction [facility] [address] [place]  [place], [place] [phone] or [phone]   [facility] ([facility]) [address], [place], [place] [phone] Accepts Medicaid  [facility] [place] [address]  [place] [phone] Substance Abuse/Addiction Treatment   [facility] Organization         Address  Phone  Notes  [facility]  [phone]   Angie Fava, PhD [address] [place], [place]   [phone] or [phone]   [facility]   [address] [place], [place] [phone]   [facility] [address] [place], [place] [phone] Insurance/Medicaid/sponsorship through [facility] and Families [address]                                    [place], [place] [phone] Therapy/tele-psych/case  [facility] [address], [place] [phone]    Dr. Lolly Mustache  [phone]   [facility] [place]  [employer] [facility] Dept. 1) [address] [place] 2) [address] [place] 3)  [address], [place] [phone]  [phone]   [facility] Child Abuse Hotline [phone] or [phone] (After Hours)

## 2014-10-06 ENCOUNTER — Ambulatory Visit (INDEPENDENT_AMBULATORY_CARE_PROVIDER_SITE_OTHER): Payer: Self-pay | Admitting: Emergency Medicine

## 2014-10-06 ENCOUNTER — Ambulatory Visit (INDEPENDENT_AMBULATORY_CARE_PROVIDER_SITE_OTHER): Payer: Self-pay

## 2014-10-06 VITALS — BP 116/68 | HR 85 | Temp 98.2°F | Resp 18 | Ht 71.0 in | Wt 231.0 lb

## 2014-10-06 DIAGNOSIS — M542 Cervicalgia: Secondary | ICD-10-CM

## 2014-10-06 DIAGNOSIS — L0231 Cutaneous abscess of buttock: Secondary | ICD-10-CM

## 2014-10-06 DIAGNOSIS — M549 Dorsalgia, unspecified: Secondary | ICD-10-CM

## 2014-10-06 DIAGNOSIS — M62830 Muscle spasm of back: Secondary | ICD-10-CM

## 2014-10-06 LAB — GLUCOSE, POCT (MANUAL RESULT ENTRY): POC GLUCOSE: 68 mg/dL — AB (ref 70–99)

## 2014-10-06 LAB — POCT GLYCOSYLATED HEMOGLOBIN (HGB A1C): HEMOGLOBIN A1C: 5.6

## 2014-10-06 MED ORDER — MELOXICAM 15 MG PO TABS: 15.0000 mg | ORAL_TABLET | Freq: Every day | ORAL | Status: DC

## 2014-10-06 MED ORDER — CLINDAMYCIN HCL 300 MG PO CAPS: 300.0000 mg | ORAL_CAPSULE | Freq: Three times a day (TID) | ORAL | Status: AC

## 2014-10-06 MED ORDER — CYCLOBENZAPRINE HCL 10 MG PO TABS: 10.0000 mg | ORAL_TABLET | Freq: Three times a day (TID) | ORAL | Status: DC | PRN

## 2014-10-06 MED ORDER — SULFAMETHOXAZOLE-TRIMETHOPRIM 800-160 MG PO TABS: 1.0000 | ORAL_TABLET | Freq: Two times a day (BID) | ORAL | Status: AC

## 2014-10-06 MED ORDER — OXYCODONE-ACETAMINOPHEN 5-325 MG PO TABS: 1.0000 | ORAL_TABLET | Freq: Three times a day (TID) | ORAL | Status: DC | PRN
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
## 2014-10-29 ENCOUNTER — Encounter (HOSPITAL_COMMUNITY): Payer: Self-pay | Admitting: Emergency Medicine

## 2014-10-29 ENCOUNTER — Emergency Department (HOSPITAL_COMMUNITY)
Admission: EM | Admit: 2014-10-29 | Discharge: 2014-10-29 | Disposition: A | Discharge status: Home / Self Care | Payer: Self-pay | Attending: Emergency Medicine | Admitting: Emergency Medicine

## 2014-10-29 DIAGNOSIS — L0231 Cutaneous abscess of buttock: Secondary | ICD-10-CM | POA: Insufficient documentation

## 2014-10-29 DIAGNOSIS — N492 Inflammatory disorders of scrotum: Secondary | ICD-10-CM | POA: Insufficient documentation

## 2014-10-29 DIAGNOSIS — Z79899 Other long term (current) drug therapy: Secondary | ICD-10-CM | POA: Insufficient documentation

## 2014-10-29 HISTORY — DX: Cutaneous abscess, unspecified: L02.91

## 2014-10-29 MED ORDER — CEPHALEXIN 500 MG PO CAPS
500.0000 mg | ORAL_CAPSULE | Freq: Four times a day (QID) | ORAL | Status: DC
Start: 2014-10-29 — End: 2015-01-18

## 2014-10-29 MED ORDER — MELOXICAM 7.5 MG PO TABS: 15.0000 mg | ORAL_TABLET | Freq: Every day | ORAL | Status: DC

## 2014-10-29 MED ORDER — LIDOCAINE HCL 2 % IJ SOLN
0.0000 mL | Freq: Once | INTRAMUSCULAR | Status: AC | PRN
  Administered 2014-10-29: 400 mg via INTRADERMAL
  Filled 2014-10-29: qty 20

## 2014-10-29 MED ORDER — SULFAMETHOXAZOLE-TRIMETHOPRIM 800-160 MG PO TABS: 1.0000 | ORAL_TABLET | Freq: Two times a day (BID) | ORAL | Status: AC

## 2014-10-29 NOTE — ED Provider Notes (Signed)
CSN: [id number]     Arrival date & time 10/29/14  1422 History   First MD Initiated Contact with Patient 10/29/14 1427     Chief Complaint  Patient presents with  . Abscess     (Consider location/radiation/quality/duration/timing/severity/associated sxs/prior Treatment) HPI  Dan Koch is a 34 y.o. male with PMH of recurrent abscesses presenting with left buttocks abscess the past couple of days. Pain worse with palpation. He has not taken anything for his symptoms. He denies alleviating factors. Pt with purulent bloody discharge. No fevers or chills. No nausea or vomiting. He denies history of diabetes. He states he has had recurrent abscesses. No history of diabetes. A shunt with history of recurrent abscesses with CT performed 09-04-2004 showed no evidence of tracts with multiple superficial infectious pockets. Patient has had multiple IND's has not followed up with general surgery.   Past Medical History  Diagnosis Date  . Abscess    Past Surgical History  Procedure Laterality Date  . Hernia repair     History reviewed. No pertinent family history. History  Substance Use Topics  . Smoking status: Never Smoker   . Smokeless tobacco: Never Used  . Alcohol Use: Yes    Review of Systems  Constitutional: Negative for fever and chills.  Gastrointestinal: Negative for nausea and vomiting.  Skin: Negative for color change and pallor.      Allergies  Strawberry and Tomato  Home Medications   Prior to Admission medications   Medication Sig Start Date End Date Taking? Authorizing Provider  acetaminophen (TYLENOL) 325 MG tablet Take 1 tablet (325 mg total) by mouth every 6 (six) hours as needed for moderate pain. Patient not taking: Reported on 10/29/2014 10/01/14   Marissa Sciacca, PA-C  cephALEXin (KEFLEX) 500 MG capsule Take 1 capsule (500 mg total) by mouth 4 (four) times daily. 10/29/14   Oswaldo ConroyVictoria Philis Doke, PA-C  cyclobenzaprine (FLEXERIL) 10 MG tablet Take 1 tablet (10  mg total) by mouth 3 (three) times daily as needed for muscle spasms. Patient not taking: Reported on 10/29/2014 10/06/14   Wallis BambergMario Mani, PA-C  ibuprofen (ADVIL,MOTRIN) 800 MG tablet Take 1 tablet (800 mg total) by mouth 3 (three) times daily. Patient not taking: Reported on 10/29/2014 09/17/14   Fayrene HelperBowie Tran, PA-C  meloxicam (MOBIC) 7.5 MG tablet Take 2 tablets (15 mg total) by mouth daily. 10/29/14   Oswaldo ConroyVictoria Shan Padgett, PA-C  oxyCODONE-acetaminophen (ROXICET) 5-325 MG per tablet Take 1 tablet by mouth every 8 (eight) hours as needed for severe pain. Patient not taking: Reported on 10/29/2014 10/06/14   Wallis BambergMario Mani, PA-C  sulfamethoxazole-trimethoprim (BACTRIM DS,SEPTRA DS) 800-160 MG per tablet Take 1 tablet by mouth 2 (two) times daily. 10/29/14 11/05/14  Oswaldo ConroyVictoria Coltrane Tugwell, PA-C   BP 152/82 mmHg  Pulse 105  Temp(Src) 97.9 F (36.6 C) (Oral)  Resp 16  SpO2 100% Physical Exam  Constitutional: He appears well-developed and well-nourished. No distress.  HENT:  Head: Normocephalic and atraumatic.  Eyes: Conjunctivae and EOM are normal. Right eye exhibits no discharge. Left eye exhibits no discharge.  Cardiovascular: Normal rate and regular rhythm.   Pulmonary/Chest: Effort normal and breath sounds normal. No respiratory distress. He has no wheezes.  Abdominal: Soft. Bowel sounds are normal. He exhibits no distension. There is no tenderness.  Genitourinary:  Normal appearing circumcised penis without erythema, lesions, swelling. No penile discharge. Scrotum with multiple abscesses, some healed, some actively draining. Peritoneum without crepitus, blisters, color change. No evidence of Fournier gangrene. 1-2cm fluctuant mass to the  left outer buttocks without active drainage that is tender to palpation. Mild erythema and some induration. 7 x 2 cm area of induration that is superficial to right scrotum with active drainage.  Neurological: He is alert. He exhibits normal muscle tone. Coordination normal.  Skin: Skin  is warm and dry. He is not diaphoretic.  Nursing note and vitals reviewed.   ED Course  Procedures (including critical care time) Labs Review Labs Reviewed  CULTURE, ROUTINE-ABSCESS    Imaging Review No results found.   EKG Interpretation None      INCISION AND DRAINAGE Performed by: Oswaldo Conroy Consent: Verbal consent obtained. Risks and benefits: risks, benefits and alternatives were discussed Type: abscess  Body area: left outer buttocks  Anesthesia: local infiltration  Incision was made with a scalpel.  Local anesthetic: lidocaine 2% without epinephrine  Anesthetic total: 3 ml  Complexity: complex Blunt dissection to break up loculations  Drainage: purulent  Drainage amount: moderate  Packing material: 1/4 in iodoform gauze  Patient tolerance: Patient tolerated the procedure well with no immediate complications.     MDM   Final diagnoses:  Scrotal abscess  Abscess of buttock, left   Patient with skin abscess amenable to incision and drainage.  Abscess packed with iodoform gauze. Encouraged home warm soaks and flushing.  Mild signs of cellulitis is surrounding skin.  Patient likely with hidradenitis. No history of diabetes. Patient to follow-up with surgery and is to call to make an appointment as soon as possible. We'll discharge with Bactrim as well as Keflex. Abscess culture ordered and pending. Patient also given referral to the wellness center to establish care.   Discussed return precautions with patient. Discussed all results and patient verbalizes understanding and agrees with plan.  This is a shared patient. This patient was discussed with the physician who saw and evaluated the patient and agrees with the plan.   Oswaldo Conroy, PA-C 10/29/14 1826  Arby Barrette, MD 10/29/14 2114

## 2014-10-29 NOTE — Discharge Instructions (Signed)
Return to the emergency room with worsening of symptoms, new symptoms or with symptoms that are concerning, especially fevers, redness, swelling, red streaks from the area, nausea, vomiting. Please take all of your antibiotics until finished!   You may develop abdominal discomfort or diarrhea from the antibiotic.  You may help offset this with probiotics which you can buy or get in yogurt. Do not eat  or take the probiotics until 2 hours after your antibiotic.  Take meloxicam for discomfort. Continue with warm soaks. Make an appointment as soon as possible with general surgery for further workup and intervention.. Read below information and follow recommendations.  Abscess Care After An abscess (also called a boil or furuncle) is an infected area that contains a collection of pus. Signs and symptoms of an abscess include pain, tenderness, redness, or hardness, or you may feel a moveable soft area under your skin. An abscess can occur anywhere in the body. The infection may spread to surrounding tissues causing cellulitis. A cut (incision) by the surgeon was made over your abscess and the pus was drained out. Gauze may have been packed into the space to provide a drain that will allow the cavity to heal from the inside outwards. The boil may be painful for 5 to 7 days. Most people with a boil do not have high fevers. Your abscess, if seen early, may not have localized, and may not have been lanced. If not, another appointment may be required for this if it does not get better on its own or with medications. HOME CARE INSTRUCTIONS   Only take over-the-counter or prescription medicines for pain, discomfort, or fever as directed by your caregiver.  When you bathe, soak and then remove gauze or iodoform packs at least daily or as directed by your caregiver. You may then wash the wound gently with mild soapy water. Repack with gauze or do as your caregiver directs. SEEK IMMEDIATE MEDICAL CARE IF:   You  develop increased pain, swelling, redness, drainage, or bleeding in the wound site.  You develop signs of generalized infection including muscle aches, chills, fever, or a general ill feeling.  An oral temperature above 102 F (38.9 C) develops, not controlled by medication. See your caregiver for a recheck if you develop any of the symptoms described above. If medications (antibiotics) were prescribed, take them as directed. Document Released: 01/25/2005 Document Revised: 10/01/2011 Document Reviewed: 09/22/2007 [facility] Patient Information 2015 [employer], [place]. This information is not intended to replace advice given to you by your health care provider. Make sure you discuss any questions you have with your health care provider.

## 2014-11-02 LAB — CULTURE, ROUTINE-ABSCESS

## 2014-11-03 ENCOUNTER — Telehealth (HOSPITAL_BASED_OUTPATIENT_CLINIC_OR_DEPARTMENT_OTHER): Payer: Self-pay | Admitting: Emergency Medicine

## 2014-11-09 ENCOUNTER — Telehealth (HOSPITAL_COMMUNITY): Payer: Self-pay

## 2014-11-10 ENCOUNTER — Emergency Department (HOSPITAL_COMMUNITY): Payer: Self-pay

## 2014-11-10 ENCOUNTER — Encounter (HOSPITAL_COMMUNITY): Payer: Self-pay | Admitting: Emergency Medicine

## 2014-11-10 ENCOUNTER — Emergency Department (HOSPITAL_COMMUNITY)
Admission: EM | Admit: 2014-11-10 | Discharge: 2014-11-10 | Disposition: A | Discharge status: Home / Self Care | Payer: Self-pay | Attending: Emergency Medicine | Admitting: Emergency Medicine

## 2014-11-10 DIAGNOSIS — L0231 Cutaneous abscess of buttock: Secondary | ICD-10-CM | POA: Insufficient documentation

## 2014-11-10 DIAGNOSIS — Z791 Long term (current) use of non-steroidal anti-inflammatories (NSAID): Secondary | ICD-10-CM | POA: Insufficient documentation

## 2014-11-10 DIAGNOSIS — L02214 Cutaneous abscess of groin: Secondary | ICD-10-CM | POA: Insufficient documentation

## 2014-11-10 DIAGNOSIS — L732 Hidradenitis suppurativa: Secondary | ICD-10-CM

## 2014-11-10 DIAGNOSIS — Z792 Long term (current) use of antibiotics: Secondary | ICD-10-CM | POA: Insufficient documentation

## 2014-11-10 DIAGNOSIS — N498 Inflammatory disorders of other specified male genital organs: Secondary | ICD-10-CM | POA: Insufficient documentation

## 2014-11-10 DIAGNOSIS — L748 Other eccrine sweat disorders: Secondary | ICD-10-CM

## 2014-11-10 LAB — CBC
HCT: 33.6 % — ABNORMAL LOW (ref 39.0–52.0)
Hemoglobin: 10.7 g/dL — ABNORMAL LOW (ref 13.0–17.0)
MCH: 21.4 pg — ABNORMAL LOW (ref 26.0–34.0)
MCHC: 31.8 g/dL (ref 30.0–36.0)
MCV: 67.3 fL — ABNORMAL LOW (ref 78.0–100.0)
Platelets: 347 K/uL (ref 150–400)
RBC: 4.99 MIL/uL (ref 4.22–5.81)
RDW: 15.1 % (ref 11.5–15.5)
WBC: 11.8 K/uL — ABNORMAL HIGH (ref 4.0–10.5)

## 2014-11-10 LAB — BASIC METABOLIC PANEL WITH GFR
Anion gap: 7 (ref 5–15)
BUN: 9 mg/dL (ref 6–23)
CO2: 31 mmol/L (ref 19–32)
Calcium: 8.9 mg/dL (ref 8.4–10.5)
Chloride: 100 mmol/L (ref 96–112)
Creatinine, Ser: 1.22 mg/dL (ref 0.50–1.35)
GFR calc Af Amer: 89 mL/min — ABNORMAL LOW
GFR calc non Af Amer: 77 mL/min — ABNORMAL LOW
Glucose, Bld: 88 mg/dL (ref 70–99)
Potassium: 3.8 mmol/L (ref 3.5–5.1)
Sodium: 138 mmol/L (ref 135–145)

## 2014-11-10 MED ORDER — OXYCODONE-ACETAMINOPHEN 5-325 MG PO TABS
1.0000 | ORAL_TABLET | Freq: Once | ORAL | Status: AC
  Administered 2014-11-10: 1 via ORAL
  Filled 2014-11-10: qty 1

## 2014-11-10 MED ORDER — IOHEXOL 300 MG/ML  SOLN
100.0000 mL | Freq: Once | INTRAMUSCULAR | Status: AC | PRN
  Administered 2014-11-10: 100 mL via INTRAVENOUS

## 2014-11-10 MED ORDER — MORPHINE SULFATE 4 MG/ML IJ SOLN
2.0000 mg | Freq: Once | INTRAMUSCULAR | Status: AC
  Administered 2014-11-10: 2 mg via INTRAVENOUS
  Filled 2014-11-10: qty 1

## 2014-11-10 MED ORDER — IOHEXOL 300 MG/ML  SOLN
80.0000 mL | Freq: Once | INTRAMUSCULAR | Status: AC | PRN
  Administered 2014-11-10: 80 mL via INTRAVENOUS

## 2014-11-10 MED ORDER — CLINDAMYCIN HCL 150 MG PO CAPS: 450.0000 mg | ORAL_CAPSULE | Freq: Three times a day (TID) | ORAL | Status: DC

## 2014-11-10 MED ORDER — HYDROCODONE-ACETAMINOPHEN 5-325 MG PO TABS: 1.0000 | ORAL_TABLET | Freq: Four times a day (QID) | ORAL | Status: DC | PRN
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
## 2014-11-10 NOTE — ED Provider Notes (Signed)
CSN: [id number]     Arrival date & time 11/10/14  1203 History   First MD Initiated Contact with Patient 11/10/14 1306     Chief Complaint  Patient presents with  . Abscess     (Consider location/radiation/quality/duration/timing/severity/associated sxs/prior Treatment) The history is provided by the patient and medical records. No language interpreter was used.     Nicola Quesnell is a 34 y.o. male  with a hx of recurrent boils presents to the Emergency Department complaining of gradual, persistent, progressively worsening abscesses to the mons pubis, groin, scrotum and buttock in addition to several on the face onset several weeks ago without improvement. Patient reports he's been having episodes of abscesses since he was 15 with the last episode beginning several weeks ago. Record review shows the patient was seen in February 2016 with a CT scan of the pelvis that showed no foreign nasal gangrene. Patient was seen again on 10/29/2014 with a I&D of the left buttock abscess.  He was placed on Bactrim and Keflex without improvement. Wound culture sent which grew multiple organisms.  Patient diagnosed with hidradenitis at the time and general surgery consult was recommended however patient has not followed up.  Patient reports associated pain and copious amounts of drainage from most of the lesions. Nothing seems to make it better or worse. He is taking meloxicam for pain without relief. He denies systemic fevers, chills, nausea, vomiting, CP, SOB, abd pain, weakness, dizziness, dysuria.    Past Medical History  Diagnosis Date  . Abscess    Past Surgical History  Procedure Laterality Date  . Hernia repair     No family history on file. History  Substance Use Topics  . Smoking status: Never Smoker   . Smokeless tobacco: Never Used  . Alcohol Use: No    Review of Systems  Constitutional: Negative for fever, diaphoresis, appetite change, fatigue and unexpected weight change.  HENT:  Negative for mouth sores.   Eyes: Negative for visual disturbance.  Respiratory: Negative for cough, chest tightness, shortness of breath and wheezing.   Cardiovascular: Negative for chest pain.  Gastrointestinal: Negative for nausea, vomiting, abdominal pain, diarrhea and constipation.  Endocrine: Negative for polydipsia, polyphagia and polyuria.  Genitourinary: Negative for dysuria, urgency, frequency and hematuria.  Musculoskeletal: Negative for back pain and neck stiffness.  Skin: Positive for wound. Negative for rash.  Allergic/Immunologic: Negative for immunocompromised state.  Neurological: Negative for syncope, light-headedness and headaches.  Hematological: Does not bruise/bleed easily.  Psychiatric/Behavioral: Negative for sleep disturbance. The patient is not nervous/anxious.       Allergies  Strawberry and Tomato  Home Medications   Prior to Admission medications   Medication Sig Start Date End Date Taking? Authorizing Provider  acetaminophen (TYLENOL) 325 MG tablet Take 1 tablet (325 mg total) by mouth every 6 (six) hours as needed for moderate pain. Patient not taking: Reported on 10/29/2014 10/01/14   Marissa Sciacca, PA-C  cephALEXin (KEFLEX) 500 MG capsule Take 1 capsule (500 mg total) by mouth 4 (four) times daily. 10/29/14   Oswaldo Conroy, PA-C  clindamycin (CLEOCIN) 150 MG capsule Take 3 capsules (450 mg total) by mouth 3 (three) times daily. 11/10/14   Finian Helvey, PA-C  cyclobenzaprine (FLEXERIL) 10 MG tablet Take 1 tablet (10 mg total) by mouth 3 (three) times daily as needed for muscle spasms. Patient not taking: Reported on 10/29/2014 10/06/14   Wallis Bamberg, PA-C  HYDROcodone-acetaminophen (NORCO/VICODIN) 5-325 MG per tablet Take 1 tablet by mouth every 6 (  six) hours as needed for moderate pain or severe pain. 11/10/14   Anetra Czerwinski, PA-C  ibuprofen (ADVIL,MOTRIN) 800 MG tablet Take 1 tablet (800 mg total) by mouth 3 (three) times daily. Patient not  taking: Reported on 10/29/2014 09/17/14   Fayrene Helper, PA-C  meloxicam (MOBIC) 7.5 MG tablet Take 2 tablets (15 mg total) by mouth daily. 10/29/14   Oswaldo Conroy, PA-C  oxyCODONE-acetaminophen (ROXICET) 5-325 MG per tablet Take 1 tablet by mouth every 8 (eight) hours as needed for severe pain. Patient not taking: Reported on 10/29/2014 10/06/14   Wallis Bamberg, PA-C   BP 119/69 mmHg  Pulse 85  Temp(Src) 98.9 F (37.2 C) (Oral)  Resp 22  Ht 6' (1.829 m)  Wt 238 lb 9.6 oz (108.228 kg)  BMI 32.35 kg/m2  SpO2 99% Physical Exam  Constitutional: He is oriented to person, place, and time. He appears well-developed and well-nourished. No distress.  Awake, alert, nontoxic appearance  HENT:  Head: Normocephalic and atraumatic.  Mouth/Throat: Oropharynx is clear and moist. No oropharyngeal exudate.  2 small sites of induration on the face without areas of fluctuance   Eyes: Conjunctivae are normal. No scleral icterus.  Neck: Normal range of motion. Neck supple.  Cardiovascular: Normal rate, regular rhythm, normal heart sounds and intact distal pulses.   No murmur heard. Pulmonary/Chest: Effort normal and breath sounds normal. No respiratory distress. He has no wheezes.  Equal chest expansion  Abdominal: Soft. Bowel sounds are normal. He exhibits no distension and no mass. There is no tenderness. There is no rebound and no guarding.  Genitourinary: Testes normal and penis normal. Circumcised.  Mild, bilateral inguinal adenopathy Erythema and induration to multiple areas of the scrotum No tenderness to palpation the testicle itself  Musculoskeletal: Normal range of motion. He exhibits no edema.  Lymphadenopathy:    He has no cervical adenopathy.       Right: Inguinal adenopathy present.       Left: Inguinal adenopathy present.  Neurological: He is alert and oriented to person, place, and time.  Speech is clear and goal oriented Moves extremities without ataxia  Skin: Skin is warm and dry. He is not  diaphoretic. There is erythema.  Multiple pockets of abscess with induration and copious amounts of purulent drainage from the mons pubis, left groin, bilateral scrotum, left buttock and perineum with significant tenderness to palpation of these areas Right axilla with large area of induration but no palpable fluctuance, draining copious amounts of purulent fluid  Psychiatric: He has a normal mood and affect.  Nursing note and vitals reviewed.   ED Course  Procedures (including critical care time) Labs Review Labs Reviewed  CBC - Abnormal; Notable for the following:    WBC 11.8 (*)    Hemoglobin 10.7 (*)    HCT 33.6 (*)    MCV 67.3 (*)    MCH 21.4 (*)    All other components within normal limits  BASIC METABOLIC PANEL - Abnormal; Notable for the following:    GFR calc non Af Amer 77 (*)    GFR calc Af Amer 89 (*)    All other components within normal limits    Imaging Review Ct Pelvis W Contrast  11/10/2014   CLINICAL DATA:  34 year old male with multiple [place] and drainage. Evaluate for foreign use gangrene  EXAM: CT PELVIS WITH CONTRAST  TECHNIQUE: Multidetector CT imaging of the pelvis was performed using the standard protocol following the bolus administration of intravenous contrast.  CONTRAST:  80mL OMNIPAQUE IOHEXOL 300 MG/ML SOLN, 100mL OMNIPAQUE IOHEXOL 300 MG/ML SOLN  COMPARISON:  Prior CT scan of the pelvis 09/04/2014  FINDINGS: Unremarkable visualized intrapelvic contents.  Extensive thickening of the skin in the medial aspect of the left buttock posteriorly with extension along the gluteal cleft to the perineum. Additionally, there is some inflammatory thickening of the skin in the medial aspect of the right buttock within the gluteal cleft. No evidence of associated abscess, fluid collection or gas. No extension beyond the superficial subcutaneous fat.  No acute fracture or aggressive appearing lytic or blastic osseous lesion.  No focal vascular abnormality.   IMPRESSION: Regions of inflammation in the left greater than right superficial buttock tissues appear confined to the dermis and underlying superficial subcutaneous fat. No evidence of abscess or emphysema to suggest a necrotizing soft tissue infection.   Electronically Signed   By: Malachy MoanHeath  McCullough M.D.   On: 11/10/2014 18:25     EKG Interpretation None       EMERGENCY DEPARTMENT US SOFT TISSUE INTERPRETATION "Study: Limited Ultrasound of the noted body part in comments below"  INDICATIONS: Pain and Soft tissue infection Multiple views of the body part are obtained with a multi-frequency linear probe  PERFORMED BY:  Myself  IMAGES ARCHIVED?: Yes  SIDE:Midline  BODY PART:Other soft tisse (comment in note) mons pubis  FINDINGS: Cellulitis present  LIMITATIONS:  Body Habitus  INTERPRETATION:  Cellulitis present  COMMENT:  Fluid collection draining  EMERGENCY DEPARTMENT US SOFT TISSUE INTERPRETATION "Study: Limited Ultrasound of the noted body part in comments below"  INDICATIONS: Pain and Soft tissue infection Multiple views of the body part are obtained with a multi-frequency linear probe  PERFORMED BY:  Myself  IMAGES ARCHIVED?: Yes  SIDE:Left  BODY PART:Other soft tisse (comment in note) Left buttock  FINDINGS: Abcess present and Cellulitis present  LIMITATIONS:  Body Habitus  INTERPRETATION:  Abcess present and Cellulitis present  COMMENT:  Multiple areas of loculated abscess and cellulitis; also draining  EMERGENCY DEPARTMENT US SOFT TISSUE INTERPRETATION "Study: Limited Ultrasound of the noted body part in comments below"  INDICATIONS: Pain and Soft tissue infection Multiple views of the body part are obtained with a multi-frequency linear probe  PERFORMED BY:  Myself  IMAGES ARCHIVED?: Yes  SIDE:Left  BODY PART:Other soft tisse (comment in note)  Left scrotum and superior left groin  FINDINGS: Abcess present and Cellulitis  present  LIMITATIONS:  Body Habitus  INTERPRETATION:  Abcess present and Cellulitis present  COMMENT:  Large abscess noted to the left scrotum and left groin with copious amounts of drainage    MDM   Final diagnoses:  Hidradenitis suppurativa  Multiple sweat gland abscesses   Ranae PalmsSamuel Baker Curvin presents with recurrent abscesses. No history of recurrent symptoms of systemic abscess. On exam patient with significant tenderness to the perineum and multiple scrotal abscesses. Will obtain basic lab work and CT pelvis to rule out Fournier's gangrene.  All visible abscesses draining well; no clear place for I&D   6:25PM CT scan with regions of inflammation but no large abscess, no sinus tracts of pus and no evidence of Fournier's gangrene.  Patient without symptoms of systemic infection. He remained afebrile and non-tachycardic. Mild leukocytosis of 11.8.  His pain remains under control.  6:56 PM Discussed with Dr. Luisa Hartornett of Surgery who will see in follow-up.  The patient was discussed with and seen by Dr. Ethelda ChickJacubowitz who agrees with the treatment plan.  BP 119/69 mmHg  Pulse 85  Temp(Src) 98.9 F (37.2 C) (Oral)  Resp 22  Ht 6' (1.829 m)  Wt 238 lb 9.6 oz (108.228 kg)  BMI 32.35 kg/m2  SpO2 99%    Dierdre Forth, PA-C 11/10/14 1923  Doug Sou, MD 11/11/14 [phone]

## 2014-11-10 NOTE — ED Provider Notes (Signed)
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 He reports abscesses at suprapubic area and the past 3 months which are painful, spontaneously draining. Also a new abscess at his right axilla over approximate the past week. No fever no nausea or vomiting. No other associated symptoms. On exam patient is alert nontoxic Glasgow Coma Score 15. HEENT exam no facial asymmetry. Right axilla. There is a fluctuant area, which is tender. Not red or warm. At suprapubic area there are abscesses which are which are spontaneously draining.   Doug SouSam Bellah Alia, MD 11/10/14 [phone]

## 2014-11-10 NOTE — Progress Notes (Signed)
Patient IV extravasated with about 80 cc in right arm.  Patient stated "he did not have any pain in his arm"  IV was removed and the patient was given an ice pack and told to elevate his arm over his head.

## 2014-11-10 NOTE — ED Notes (Signed)
The pt went back to xray 

## 2014-11-10 NOTE — Discharge Instructions (Signed)
1. Medications: vicodin for pain, clindamycin, usual home medications 2. Treatment: rest, drink plenty of fluids, use warm compresses, flush abscess with warm water several times per day 3. Follow Up: Please followup with [facility] [place] [facility] as discussed in 2-3 days for discussion of your diagnoses and further evaluation after today's visit; if you do not have a primary care doctor use the resource guide provided to find one; Please return to the ER for fevers, chills, nausea, vomiting or other signs of infection   Hidradenitis Suppurativa, Sweat Gland Abscess Hidradenitis suppurativa is a long lasting (chronic), uncommon disease of the sweat glands. With this, boil-like lumps and scarring develop in the groin, some times under the arms (axillae), and under the breasts. It may also uncommonly occur behind the ears, in the crease of the buttocks, and around the genitals.  CAUSES  The cause is from a blocking of the sweat glands. They then become infected. It may cause drainage and odor. It is not contagious. So it cannot be given to someone else. It most often shows up in puberty (about 4410 to 33 years of age). But it may happen much later. It is similar to acne which is a disease of the sweat glands. This condition is slightly more common in African-Americans and women. SYMPTOMS   Hidradenitis usually starts as one or more red, tender, swellings in the groin or under the arms (axilla).  Over a period of hours to days the lesions get larger. They often open to the skin surface, draining clear to yellow-colored fluid.  The infected area heals with scarring. DIAGNOSIS  Your caregiver makes this diagnosis by looking at you. Sometimes cultures (growing germs on plates in the lab) may be taken. This is to see what germ (bacterium) is causing the infection.  TREATMENT   Topical germ killing medicine applied to the skin (antibiotics) are the treatment of choice. Antibiotics taken by mouth (systemic)  are sometimes needed when the condition is getting worse or is severe.  Avoid tight-fitting clothing which traps moisture in.  Dirt does not cause hidradenitis and it is not caused by poor hygiene.  Involved areas should be cleaned daily using an antibacterial soap. Some patients find that the liquid form of Lever 2000, applied to the involved areas as a lotion after bathing, can help reduce the odor related to this condition.  Sometimes surgery is needed to drain infected areas or remove scarred tissue. Removal of large amounts of tissue is used only in severe cases.  Birth control pills may be helpful.  Oral retinoids (vitamin A derivatives) for 6 to 12 months which are effective for acne may also help this condition.  Weight loss will improve but not cure hidradenitis. It is made worse by being overweight. But the condition is not caused by being overweight.  This condition is more common in people who have had acne.  It may become worse under stress. There is no medical cure for hidradenitis. It can be controlled, but not cured. The condition usually continues for years with periods of getting worse and getting better (remission). Document Released: 02/21/2004 Document Revised: 10/01/2011 Document Reviewed: 10/09/2013 [facility] Patient Information 2015 [place], [place]. This information is not intended to replace advice given to you by your health care provider. Make sure you discuss any questions you have with your health care provider.    Emergency Department Resource Guide 1) Find a Doctor and Pay Out of Pocket Although you won't have to find out who is  covered by your insurance plan, it is a good idea to ask around and get recommendations. You will then need to call the office and see if the doctor you have chosen will accept you as a new patient and what types of options they offer for patients who are self-pay. Some doctors offer discounts or will set up payment plans for their  patients who do not have insurance, but you will need to ask so you aren't surprised when you get to your appointment.  2) Contact Your Local Health Department Not all health departments have doctors that can see patients for sick visits, but many do, so it is worth a call to see if yours does. If you don't know where your local health department is, you can check in your phone book. The CDC also has a tool to help you locate your state's health department, and many state websites also have listings of all of their local health departments.  3) Find a Walk-in Clinic If your illness is not likely to be very severe or complicated, you may want to try a walk in clinic. These are popping up all over the country in pharmacies, drugstores, and shopping centers. They're usually staffed by nurse practitioners or physician assistants that have been trained to treat common illnesses and complaints. They're usually fairly quick and inexpensive. However, if you have serious medical issues or chronic medical problems, these are probably not your best option.  No Primary Care Doctor: - Call Health Connect at  [phone] - they can help you locate a primary care doctor that  accepts your insurance, provides certain services, etc. - Physician Referral Service- [phone]  Chronic Pain Problems: Organization         Address  Phone   Notes  [facility]  [phone] Patients need to be referred by their primary care doctor.   Medication Assistance: Organization         Address  Phone   Notes  [facility] Med[facility] [address] [place], [place] [address] [phone] --Must be a resident of [facility] -- Must have NO insurance coverage whatsoever (no Medicaid/ Medicare, etc.) -- The pt. MUST have a primary care doctor that directs their care regularly and follows them in the community   [facility]  [phone]   [employer]  [phone]     Agencies that provide inexpensive medical care: Organization         Address  Phone   Notes  [facility]  [phone]   [facility]    [phone]   [facility] [address] [place], [place] [address] [phone]   [facility] [address] [place] [phone]   [facility]anned Parenthood    [phone]   [facility]    [phone]   Community [facility] and [facility]  [address], [place] Phone:  [phone], Fax:  [phone] Hours of Operation:  9 am - 6 pm, M-F.  Also accepts Medicaid/Medicare and self-pay.  [facility]  [address],  Phone: [phone], Fax: [phone]. Hours of Operation:  8:30 am - 5:30 pm, M-F.  Also accepts Medicaid and self-pay.  [facility] [place] [address] [place] Phone: [phone]   [facility] [address] [place], [place] [phone], Ext.  123 Mondays & Thursdays: 7-9 AM.  First 15 patients are seen on a first come, first serve basis.    Medicaid-accepting [facility] Providers:  Organization         Address  Phone   Notes  [facility] [address], [place] [facility] [phone] Also accepts self-pay patients.  [facility] [address] Laurell Josephs [place], [place]  [phone]   [facility] [address], [place] [phone]   [facility] [address] [place] [phone]   Renaye Rakers [address], [place]   [phone] Only accepts [place] Access [place] patients after they have their name applied to their card.   Self-Pay (no insurance) in [facility]:  Organization         Address  Phone   Notes  Sickle Cell Patients, [facility] [address] [place], [place] [phone]    [facility] [address] [place], [place] [phone]   [facility] [place]  [address], [place] [phone]   [facility]/Dr. Osei-Bonsu  [address], [place] or [address], [place] [phone] Phone number for both [place] and [place] locations is the same.  Urgent Medical and [facility] [address] [place] [phone]   [facility] [address], [place] or [address] [phone]   [facility] [address] [place] [phone], phone; [phone], fax Sees patients 1st and 3rd Saturday of every month.  Must not qualify for public or private insurance (i.e. Medicaid, Medicare, [place] Health Choice, Veterans' Benefits)  Household income should be no more than 200% of the poverty level The clinic cannot treat you if you are pregnant or think you are pregnant  Sexually transmitted diseases are not treated at the clinic.    Dental Care: Organization         Address  Phone  Notes  [facility] Department of [facility] [address] [place], [place] [phone] Accepts children up to age 85 who are enrolled in [place] or [place] Health Choice; pregnant women with a Medicaid card; and children who have applied for Medicaid or [place] Health Choice, but were declined, whose parents can pay a reduced fee at time of service.  [facility] Department of [facility]  [address], [place] [phone] Accepts children up to age 63 who are enrolled in [place] or [place] Health Choice; pregnant women with a Medicaid card; and children who have applied for Medicaid or [place] Health Choice, but were declined, whose parents can pay a reduced fee at time of service.  [facility]  [address] [place], [place] [phone] Patients are seen by  appointment only. Walk-ins are not accepted. [facility] will see patients 62 years of age and older. Monday - Tuesday (8am-5pm) Most Wednesdays (8:30-5pm) $30 per visit, cash only  [facility] Adult [facility]  [address], [place] [phone] Patients are seen by appointment only. Walk-ins are not accepted. [facility] will see patients 69 years of age and older. One Wednesday Evening (Monthly: Volunteer Based).  $30 per visit, cash only  [employer]  [phone] for  adults; Children under age 894, call Graduate Pediatric Dentistry at [phone]. Children aged 784-14, please call [phone] to request a pediatric application.  Dental services are provided in all areas of dental care including fillings, crowns and bridges, complete and partial dentures, implants, gum treatment, root canals, and extractions. Preventive care is also provided. Treatment is provided to both adults and children. Patients are selected via a lottery and there is often a waiting list.   [facility] [address], [place]  [phone] [email or web address]   [facility] [address], [place], [place] [phone], Ext. [phone] Second and Fourth Thursday of each month, opens at 6:30 AM; Clinic ends at 9 AM.  Patients are seen on a first-come first-served basis, and a limited number are seen during each clinic.   [facility]  [address], [place], [place] [phone]   Eligibility Requirements You must have lived in [place], [place], or [place] counties for at least the last three months.   You cannot be eligible for state or federal sponsored [place] Cityhealthcare insurance, including CIG[employer]eterans Administration, [place]icaid, or [employer]icare.   You generally cannot be eligible for healthcare insurance through your employer.    How to apply: Eligibility screenings are held every Tuesday and Wednesday afternoon from 1:00 pm until 4:00 pm. You  do not need an appointment for the interview!  [facility] [address], [place], [place] [phone]   [facility]  [phone]   [facility]  [phone]   [facility]  [phone]    Behavioral Health Resources in the Community: Intensive Outpatient Programs Organization         Address  Phone  Notes  [facility] [address], [place], [place] [phone]   [facility] [address], [place], [place] [phone]   [facility] [address], [place], [place]  [phone]   [facility] [address],  [place], [place] [phone] or [phone]   Substance Abuse Resources Organization         Address  Phone  Notes  [facility]  [phone]   [facility]  [phone]   [facility]  [phone]   [facility]  [phone]   Residential & Outpatient Substance Abuse Program  [phone]   Psychological Services Organization         Address  Phone  Notes  [facility]  [phone]- [phone]   [facility]  [phone]- [phone]   [facility] [address], [place] [phone] or [phone]    Mobile Crisis Teams Organization         Address  Phone  Notes  [facility], Mobile Crisis Care Unit  [phone]   [facility]  [address] [place], [place] [phone]   Doristine LocksSharon DeEsch [address] [place] [phone]    Self-Help/Support Groups Organization         Address  Phone             Notes  [facility] [place] - variety of support groups  [phone] Call for more information  Narcotics Anonymous (NA), [facility] [address], [place]  2 meetings at this location   Educational psychologistesidential Treatment Programs Organization          Address  Phone  Notes  ASAP  [facility]idential [facility] [address],    °[place]  [phone]   °[facility] ° [address], [place], [place] [phone]   °[facility] [address], [place] [phone] Admissions: 8am-3pm M-F  °[facility] [address]    °[place], [place] [phone]   °[facility] [address], [place], [place] [phone]   °[facility] [address]  °[place], [place] [phone]   °[facility] - Intensive Outpatient [address], [place], [place] [phone]   °[facility] ([facility] [address]  °[place], [place] [phone] or [phone]   °[facility] ([facility]) [address] [place], [place] [phone] Accepts Medicaid  °[facility] [address]  ° [place] 1-800-659-3381 Substance Abuse/Addiction Treatment  ° °[place] [facility] Health Resources °Organization         Address  Phone  Notes  °[facility]  ([phone]) [phone]   °Julie Brannon, PhD [address] [place], [place]   ([phone]) [phone] or (336) [phone]   °[facility]   [address] °[place], [place] ([phone]) [phone]   °[facility] [address], [place], [place] ([phone]) [phone] Insurance/Medicaid/sponsorship through [facility]  °[facility] [address]                                    [place], [place] [phone]) [phone] Therapy/tele-psych/case  °[facility] [address]  ° [place], [place] ([phone]) [phone]    °Dr. Arfeen  ([phone]) [phone]   °[facility] [place]  [employer] [place] [facility] 1) [address], [place] °2) [address], [place] °3)  [address], [place] ([phone]) [phone] °(336) [phone] ° °(336) [phone]   °[place] Child Abuse Hotline (336) 342-1394 or (336) [phone] (After Hours)    ° ° ° °

## 2014-11-10 NOTE — ED Notes (Signed)
Pt c/o multiple "boils" -- has been seen for same-- on buttocks and face-- states one is draining.

## 2014-11-10 NOTE — ED Notes (Signed)
Pt asking how long before he can leave

## 2014-11-10 NOTE — ED Notes (Signed)
Pt ambulated to room without difficulty. PA in to see.

## 2014-11-10 NOTE — ED Notes (Signed)
Pt returned from ct

## 2014-11-10 NOTE — ED Notes (Signed)
To ct

## 2014-11-26 ENCOUNTER — Emergency Department (HOSPITAL_COMMUNITY)
Admission: EM | Admit: 2014-11-26 | Discharge: 2014-11-26 | Disposition: A | Discharge status: Home / Self Care | Payer: No Typology Code available for payment source | Attending: Emergency Medicine | Admitting: Emergency Medicine

## 2014-11-26 ENCOUNTER — Encounter (HOSPITAL_COMMUNITY): Payer: Self-pay | Admitting: Emergency Medicine

## 2014-11-26 DIAGNOSIS — Z792 Long term (current) use of antibiotics: Secondary | ICD-10-CM | POA: Insufficient documentation

## 2014-11-26 DIAGNOSIS — G8929 Other chronic pain: Secondary | ICD-10-CM | POA: Diagnosis not present

## 2014-11-26 DIAGNOSIS — M25512 Pain in left shoulder: Secondary | ICD-10-CM | POA: Insufficient documentation

## 2014-11-26 DIAGNOSIS — Z79899 Other long term (current) drug therapy: Secondary | ICD-10-CM | POA: Diagnosis not present

## 2014-11-26 DIAGNOSIS — Z872 Personal history of diseases of the skin and subcutaneous tissue: Secondary | ICD-10-CM | POA: Diagnosis not present

## 2014-11-26 DIAGNOSIS — Z791 Long term (current) use of non-steroidal anti-inflammatories (NSAID): Secondary | ICD-10-CM | POA: Insufficient documentation

## 2014-11-26 DIAGNOSIS — R200 Anesthesia of skin: Secondary | ICD-10-CM | POA: Diagnosis not present

## 2014-11-26 MED ORDER — NAPROXEN 500 MG PO TABS: 500.0000 mg | ORAL_TABLET | Freq: Two times a day (BID) | ORAL | Status: DC

## 2014-11-26 MED ORDER — OXYCODONE-ACETAMINOPHEN 5-325 MG PO TABS
1.0000 | ORAL_TABLET | Freq: Once | ORAL | Status: AC
  Administered 2014-11-26: 1 via ORAL
  Filled 2014-11-26: qty 1

## 2014-11-26 MED ORDER — OXYCODONE-ACETAMINOPHEN 5-325 MG PO TABS: 1.0000 | ORAL_TABLET | Freq: Three times a day (TID) | ORAL | Status: DC | PRN
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
## 2014-11-26 NOTE — Discharge Instructions (Signed)

## 2014-11-26 NOTE — ED Notes (Signed)
The patient said he has had two cortisone shots to his left arm.  Today, he said he received the second shot and it has been hurting him ever since.  He says he feels like his arm is going to fall off.  He rates his pain 10/10.  He took a Benadryl and three ibuprofens and he is still hurting.

## 2015-01-18 ENCOUNTER — Encounter (HOSPITAL_COMMUNITY): Payer: Self-pay | Admitting: Emergency Medicine

## 2015-01-18 ENCOUNTER — Emergency Department (HOSPITAL_COMMUNITY)
Admission: EM | Admit: 2015-01-18 | Discharge: 2015-01-18 | Disposition: A | Discharge status: Home / Self Care | Payer: Self-pay | Attending: Emergency Medicine | Admitting: Emergency Medicine

## 2015-01-18 DIAGNOSIS — R200 Anesthesia of skin: Secondary | ICD-10-CM | POA: Insufficient documentation

## 2015-01-18 DIAGNOSIS — Z872 Personal history of diseases of the skin and subcutaneous tissue: Secondary | ICD-10-CM | POA: Insufficient documentation

## 2015-01-18 DIAGNOSIS — Z791 Long term (current) use of non-steroidal anti-inflammatories (NSAID): Secondary | ICD-10-CM | POA: Insufficient documentation

## 2015-01-18 DIAGNOSIS — M542 Cervicalgia: Secondary | ICD-10-CM | POA: Insufficient documentation

## 2015-01-18 DIAGNOSIS — M546 Pain in thoracic spine: Secondary | ICD-10-CM | POA: Insufficient documentation

## 2015-01-18 MED ORDER — OXYCODONE-ACETAMINOPHEN 5-325 MG PO TABS: 1.0000 | ORAL_TABLET | Freq: Four times a day (QID) | ORAL | Status: DC | PRN

## 2015-01-18 MED ORDER — METHOCARBAMOL 500 MG PO TABS: 1000.0000 mg | ORAL_TABLET | Freq: Four times a day (QID) | ORAL | Status: DC

## 2015-01-18 MED ORDER — METHOCARBAMOL 500 MG PO TABS
500.0000 mg | ORAL_TABLET | Freq: Once | ORAL | Status: AC
  Administered 2015-01-18: 500 mg via ORAL
  Filled 2015-01-18: qty 1

## 2015-01-18 MED ORDER — IBUPROFEN 600 MG PO TABS: 600.0000 mg | ORAL_TABLET | Freq: Four times a day (QID) | ORAL | Status: DC | PRN

## 2015-01-18 MED ORDER — OXYCODONE-ACETAMINOPHEN 5-325 MG PO TABS
2.0000 | ORAL_TABLET | Freq: Once | ORAL | Status: AC
  Administered 2015-01-18: 2 via ORAL
  Filled 2015-01-18: qty 2
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
## 2015-01-18 NOTE — ED Notes (Signed)
Pt. reports mid and upper back pain onset yesterday , denies injury or fall , respirations unlabored .

## 2015-01-18 NOTE — Discharge Instructions (Signed)
Please read and follow all provided instructions.  Your diagnoses today include:  1. Left-sided thoracic back pain    Tests performed today include:  Vital signs - see below for your results today  Medications prescribed:   Percocet (oxycodone/acetaminophen) - narcotic pain medication  DO NOT drive or perform any activities that require you to be awake and alert because this medicine can make you drowsy. BE VERY CAREFUL not to take multiple medicines containing Tylenol (also called acetaminophen). Doing so can lead to an overdose which can damage your liver and cause liver failure and possibly death.   Robaxin (methocarbamol) - muscle relaxer medication  DO NOT drive or perform any activities that require you to be awake and alert because this medicine can make you drowsy.    Ibuprofen (Motrin, Advil) - anti-inflammatory pain medication  Do not exceed  ibuprofen every 6 hours, take with food  You have been prescribed an anti-inflammatory medication or NSAID. Take with food. Take smallest effective dose for the shortest duration needed for your pain. Stop taking if you experience stomach pain or vomiting.   Take any prescribed medications only as directed.  Home care instructions:   Follow any educational materials contained in this packet  Please rest, use ice or heat on your back for the next several days  Do not lift, push, pull anything more than 10 pounds for the next week  Follow-up instructions: Please follow-up with your primary care provider in the next 1 week for further evaluation of your symptoms.   Return instructions:  SEEK IMMEDIATE MEDICAL ATTENTION IF YOU HAVE:  New numbness, tingling, weakness, or problem with the use of your arms or legs  Severe back pain not relieved with medications  Loss control of your bowels or bladder  Increasing pain in any areas of the body (such as chest or abdominal pain)  Shortness of breath, dizziness, or fainting.    Worsening nausea (feeling sick to your stomach), vomiting, fever, or sweats  Any other emergent concerns regarding your health   Additional Information:  Your vital signs today were: BP 117/70 mmHg   Pulse 71   Temp(Src) 98.1 F (36.7 C) (Oral)   Resp 18   Wt 241 lb 4 oz (109.43 kg)   SpO2 94% If your blood pressure (BP) was elevated above 135/85 this visit, please have this repeated by your doctor within one month. --------------

## 2015-01-21 ENCOUNTER — Emergency Department (HOSPITAL_COMMUNITY)
Admission: EM | Admit: 2015-01-21 | Discharge: 2015-01-22 | Disposition: A | Discharge status: Home / Self Care | Payer: Self-pay | Attending: Emergency Medicine | Admitting: Emergency Medicine

## 2015-01-21 DIAGNOSIS — L732 Hidradenitis suppurativa: Secondary | ICD-10-CM

## 2015-01-21 DIAGNOSIS — L02411 Cutaneous abscess of right axilla: Secondary | ICD-10-CM

## 2015-01-21 MED ORDER — LIDOCAINE HCL (PF) 2 % IJ SOLN
0.0000 mL | Freq: Once | INTRAMUSCULAR | Status: AC | PRN
  Filled 2015-01-21: qty 20

## 2015-01-21 MED ORDER — OXYCODONE-ACETAMINOPHEN 5-325 MG PO TABS
2.0000 | ORAL_TABLET | Freq: Once | ORAL | Status: AC
  Administered 2015-01-22: 2 via ORAL
  Filled 2015-01-21: qty 2

## 2015-01-21 NOTE — ED Provider Notes (Signed)
CSN: [id number]     Arrival date & time 01/21/15  2326 History   This chart was scribed for Dan Conroy, PA-C working with No att. providers found by Elveria Rising, ED Scribe. This patient was seen in room TR02C/TR02C and the patient's care was started at 11:52 PM.   Chief Complaint  Patient presents with  . Abscess    The history is provided by the patient. No language interpreter was used.   HPI Comments: Dan Koch is a 34 y.o. male who presents to the Emergency Department complaining of recurrent abscess to right axilla for one month. Patient reports drainage from the site, which prompted his visit. Patient reports exacerbated pain with applied pressure and movement of his arm. Patient shares history of abscess to location; chart history reveals diagnosis of hidradenitis suppurativa. Patient denies fever, chills, nausea, or vomiting.   Past Medical History  Diagnosis Date  . Abscess    Past Surgical History  Procedure Laterality Date  . Hernia repair     History reviewed. No pertinent family history. History  Substance Use Topics  . Smoking status: Never Smoker   . Smokeless tobacco: Never Used  . Alcohol Use: No    Review of Systems  Constitutional: Negative for fever and chills.  Gastrointestinal: Negative for nausea and vomiting.  Skin: Positive for color change and wound.       Abscess     Allergies  Strawberry and Tomato  Home Medications   Prior to Admission medications   Medication Sig Start Date End Date Taking? Authorizing Provider  clindamycin (CLEOCIN) 300 MG capsule Take 1 capsule (300 mg total) by mouth 4 (four) times daily. X 10 days 01/22/15   Dan Conroy, PA-C  clindamycin [facility]) 1 % lotion Apply topically 2 (two) times daily. 01/22/15   Dan Conroy, PA-C  HYDROcodone-acetaminophen (NORCO/VICODIN) 5-325 MG per tablet Take 1 tablet by mouth every 6 (six) hours as needed. 01/22/15   Dan Conroy, PA-C  ibuprofen (ADVIL,MOTRIN) 600  MG tablet Take 1 tablet (600 mg total) by mouth every 6 (six) hours as needed. 01/18/15   Renne Crigler, PA-C  methocarbamol (ROBAXIN) 500 MG tablet Take 2 tablets (1,000 mg total) by mouth 4 (four) times daily. 01/18/15   Renne Crigler, PA-C  oxyCODONE-acetaminophen (PERCOCET/ROXICET) 5-325 MG per tablet Take 1-2 tablets by mouth every 6 (six) hours as needed for severe pain. 01/18/15   Renne Crigler, PA-C   Triage Vitals: BP 138/68 mmHg  Pulse 108  Temp(Src) 99.5 F (37.5 C) (Oral)  Resp 18  SpO2 99% Physical Exam  Constitutional: He appears well-developed and well-nourished. No distress.  HENT:  Head: Normocephalic and atraumatic.  Eyes: Conjunctivae are normal. Right eye exhibits no discharge. Left eye exhibits no discharge.  Pulmonary/Chest: Effort normal. No respiratory distress.  Neurological: He is alert. Coordination normal.  Skin: He is not diaphoretic.  5-6cm circular fluctuant  mass to right anterior axilla without spontaneous drainage. Mild evidence of cellulitis. 2cm abscess to right posterior axilla with spontaneous purulent drainage,   Psychiatric: He has a normal mood and affect. His behavior is normal.  Nursing note and vitals reviewed.   ED Course  Procedures (including critical care time)  COORDINATION OF CARE: 11:55 PM- Discussed treatment plan with patient at bedside and patient agreed to plan.   Labs Review Labs Reviewed - No data to display  Imaging Review No results found.   EKG Interpretation None      INCISION AND DRAINAGE Performed by:  Wylene Weissman Consent: Verbal consent obtained. Risks and benefits: risks, benefits and alternatives were discussed Type: abscess  Body area: right axilla  Anesthesia: local infiltration  Incision was made with a scalpel.  Local anesthetic: lidocaine 2% w/o epinephrine  Anesthetic total: 4 ml  Complexity: complex Blunt dissection to break up loculations  Drainage: purulent  Drainage amount:  copious  Packing material: 1/4 in iodoform gauze  Patient tolerance: Patient tolerated the procedure well with no immediate complications.     MDM   Final diagnoses:  Hidradenitis suppurativa  Abscess of right axilla   Patient presenting with abscess to right axilla with history of hidradenitis. VSS. No systemic symptoms. Abscess drained without immediate complications. Packing in place. Patient to present 2 days for wound recheck. Will prescribe clindamycin due to history of hidradenitis as well as clindamycin cream. Patient's been given a referral to surgery for definitive treatment.  Discussed return precautions with patient. Discussed all results and patient verbalizes understanding and agrees with plan.  I personally performed the services described in this documentation, which was scribed in my presence. The recorded information has been reviewed and is accurate.   Dan ConroyVictoria Aroura Vasudevan, PA-C 01/22/15 2135  April Palumbo, MD 01/22/15 2348

## 2015-01-22 ENCOUNTER — Encounter (HOSPITAL_COMMUNITY): Payer: Self-pay | Admitting: Emergency Medicine

## 2015-01-22 MED ORDER — CLINDAMYCIN PHOSPHATE 1 % EX LOTN: TOPICAL_LOTION | Freq: Two times a day (BID) | CUTANEOUS | Status: DC

## 2015-01-22 MED ORDER — HYDROCODONE-ACETAMINOPHEN 5-325 MG PO TABS: 1.0000 | ORAL_TABLET | Freq: Four times a day (QID) | ORAL | Status: DC | PRN

## 2015-01-22 MED ORDER — CLINDAMYCIN HCL 300 MG PO CAPS: 300.0000 mg | ORAL_CAPSULE | Freq: Four times a day (QID) | ORAL | Status: DC

## 2015-01-22 NOTE — ED Provider Notes (Signed)
Pharmacy called, pt can't afford script for clindamycin. Will change to keflex and bactrim. Verbal order given to pharmacy.   Camprubi-Soms, Donnita FallsMercedes Strupp, [place] JerseyPA-C  3:43 PM 01/22/2015   Allen DerryMercedes Camprubi-Soms, PA-C 01/22/15 1544  Jerelyn ScottMartha Linker, MD 01/22/15 [phone]

## 2015-01-22 NOTE — ED Notes (Signed)
Boil/abscess under right arm-- seen here 1 week ago for same--

## 2015-01-22 NOTE — Discharge Instructions (Signed)
Return to the emergency room with worsening of symptoms, new symptoms or with symptoms that are concerning, especially fevers, redness, swelling, pus, red streaks from the area, generalized unwell feeling. Follow-up with either urgent care, [facility], emergency room for recheck of abscess in 2 days. Please take all of your antibiotics until finished!   You may develop abdominal discomfort or diarrhea from the antibiotic.  You may help offset this with probiotics which you can buy or get in yogurt. Do not eat  or take the probiotics until 2 hours after your antibiotic.  Call to make follow up appointment with surgery. Read below information and follow recommendations.  Abscess Care After An abscess (also called a boil or furuncle) is an infected area that contains a collection of pus. Signs and symptoms of an abscess include pain, tenderness, redness, or hardness, or you may feel a moveable soft area under your skin. An abscess can occur anywhere in the body. The infection may spread to surrounding tissues causing cellulitis. A cut (incision) by the surgeon was made over your abscess and the pus was drained out. Gauze may have been packed into the space to provide a drain that will allow the cavity to heal from the inside outwards. The boil may be painful for 5 to 7 days. Most people with a boil do not have high fevers. Your abscess, if seen early, may not have localized, and may not have been lanced. If not, another appointment may be required for this if it does not get better on its own or with medications. HOME CARE INSTRUCTIONS   Only take over-the-counter or prescription medicines for pain, discomfort, or fever as directed by your caregiver.  When you bathe, soak and then remove gauze or iodoform packs at least daily or as directed by your caregiver. You may then wash the wound gently with mild soapy water. Repack with gauze or do as your caregiver directs. SEEK IMMEDIATE MEDICAL CARE  IF:   You develop increased pain, swelling, redness, drainage, or bleeding in the wound site.  You develop signs of generalized infection including muscle aches, chills, fever, or a general ill feeling.  An oral temperature above 102 F (38.9 C) develops, not controlled by medication. See your caregiver for a recheck if you develop any of the symptoms described above. If medications (antibiotics) were prescribed, take them as directed. Document Released: 01/25/2005 Document Revised: 10/01/2011 Document Reviewed: 09/22/2007 [employer] Patient Information 2015 [place], [place]. This information is not intended to replace advice given to you by your health care provider. Make sure you discuss any questions you have with your health care provider.

## 2015-01-27 ENCOUNTER — Emergency Department (HOSPITAL_COMMUNITY)
Admission: EM | Admit: 2015-01-27 | Discharge: 2015-01-27 | Disposition: A | Discharge status: Home / Self Care | Payer: Self-pay | Attending: Emergency Medicine | Admitting: Emergency Medicine

## 2015-01-27 ENCOUNTER — Encounter (HOSPITAL_COMMUNITY): Payer: Self-pay | Admitting: Emergency Medicine

## 2015-01-27 DIAGNOSIS — Z5189 Encounter for other specified aftercare: Secondary | ICD-10-CM

## 2015-01-27 DIAGNOSIS — L02411 Cutaneous abscess of right axilla: Secondary | ICD-10-CM | POA: Insufficient documentation

## 2015-01-27 MED ORDER — CEPHALEXIN 500 MG PO CAPS: 500.0000 mg | ORAL_CAPSULE | Freq: Four times a day (QID) | ORAL | Status: DC

## 2015-01-27 MED ORDER — OXYCODONE-ACETAMINOPHEN 5-325 MG PO TABS: 1.0000 | ORAL_TABLET | Freq: Four times a day (QID) | ORAL | Status: DC | PRN

## 2015-01-27 NOTE — ED Notes (Signed)
Pt. is here for a follow up of his multiple small abscesses at right axilla incised here last 01/21/2015 .

## 2015-01-27 NOTE — ED Provider Notes (Signed)
CSN: [id number]     Arrival date & time 01/27/15  0030 History   First MD Initiated Contact with Patient 01/27/15 0037     No chief complaint on file.    (Consider location/radiation/quality/duration/timing/severity/associated sxs/prior Treatment) HPI Comments: Patient presents to the emergency department with chief complaint of wound check. Patient had 2 abscesses drained approximately 6 days ago in his right axilla. Patient states that he has still had mild drainage. He states that the more anterior wound is still painful. He denies any associated fevers or chills. He has been taking Bactrim but has not filled the Keflex. His symptoms are aggravated with movement and palpation.  The history is provided by the patient. No language interpreter was used.    Past Medical History  Diagnosis Date  . Abscess    Past Surgical History  Procedure Laterality Date  . Hernia repair     No family history on file. History  Substance Use Topics  . Smoking status: Never Smoker   . Smokeless tobacco: Never Used  . Alcohol Use: No    Review of Systems  Constitutional: Negative for fever and chills.  Respiratory: Negative for shortness of breath.   Cardiovascular: Negative for chest pain.  Gastrointestinal: Negative for nausea, vomiting, diarrhea and constipation.  Genitourinary: Negative for dysuria.  Skin: Positive for wound.      Allergies  Strawberry and Tomato  Home Medications   Prior to Admission medications   Medication Sig Start Date End Date Taking? Authorizing Provider  cephALEXin (KEFLEX) 500 MG capsule Take 1 capsule (500 mg total) by mouth 4 (four) times daily. 01/27/15   Roxy Horsemanobert Nettye Flegal, PA-C  clindamycin (CLEOCIN) 300 MG capsule Take 1 capsule (300 mg total) by mouth 4 (four) times daily. X 10 days 01/22/15   Oswaldo ConroyVictoria Creech, PA-C  clindamycin [facility](CLINDAMAX) 1 % lotion Apply topically 2 (two) times daily. 01/22/15   Oswaldo ConroyVictoria Creech, PA-C  HYDROcodone-acetaminophen  (NORCO/VICODIN) 5-325 MG per tablet Take 1 tablet by mouth every 6 (six) hours as needed. 01/22/15   Oswaldo ConroyVictoria Creech, PA-C  ibuprofen (ADVIL,MOTRIN) 600 MG tablet Take 1 tablet (600 mg total) by mouth every 6 (six) hours as needed. 01/18/15   Renne CriglerJoshua Geiple, PA-C  methocarbamol (ROBAXIN) 500 MG tablet Take 2 tablets (1,000 mg total) by mouth 4 (four) times daily. 01/18/15   Renne CriglerJoshua Geiple, PA-C  oxyCODONE-acetaminophen (PERCOCET/ROXICET) 5-325 MG per tablet Take 1-2 tablets by mouth every 6 (six) hours as needed for severe pain. 01/27/15   Roxy Horsemanobert Graham Doukas, PA-C   BP 135/80 mmHg  Pulse 91  Temp(Src) 98.4 F (36.9 C) (Oral)  Resp 20  Ht 5\' 10"  (1.778 m)  Wt 238 lb (107.956 kg)  BMI 34.15 kg/m2  SpO2 95% Physical Exam  Constitutional: He is oriented to person, place, and time. He appears well-developed and well-nourished.  HENT:  Head: Normocephalic and atraumatic.  Eyes: Conjunctivae and EOM are normal.  Neck: Normal range of motion.  Cardiovascular: Normal rate.   Pulmonary/Chest: Effort normal.  Abdominal: He exhibits no distension.  Musculoskeletal: Normal range of motion.  Neurological: He is alert and oriented to person, place, and time.  Skin: Skin is dry.  Posterior right axillary abscess has drained, and is non-tender, anterior right axillary abscess is still tender and mildly fluctuant  Psychiatric: He has a normal mood and affect. His behavior is normal. Judgment and thought content normal.  Nursing note and vitals reviewed.   ED Course  Procedures (including critical care time) Labs Review Labs Reviewed -  No data to display  Imaging Review No results found.   EKG Interpretation None      MDM   Final diagnoses:  Wound check, abscess    Patient here for wound check. Right anterior axillary abscess is moderately fluctuant, there could be residual fluid retention/collection. I encouraged patient to have this explored in the emergency department. Patient adamantly  refuses further incision and drainage at this time. I have instructed the patient to use warm compresses and to fill his Keflex prescription. Patient states that he wants to try the warm compresses and antibiotics for a while longer. I told him that it is unlikely that this will fix the problem, and that the abscess needs to be drained. He states that he will return if needed. Discharge to home.    Roxy Horseman, PA-C 01/27/15 0107  Marisa Severin, MD 01/27/15 [phone]

## 2015-01-27 NOTE — Discharge Instructions (Signed)

## 2015-01-27 NOTE — ED Notes (Signed)
Pt verbalizes understanding of d/c instructions and denies any further needs at this time. 

## 2015-02-02 ENCOUNTER — Encounter (HOSPITAL_COMMUNITY): Payer: Self-pay | Admitting: Emergency Medicine

## 2015-02-02 DIAGNOSIS — Z792 Long term (current) use of antibiotics: Secondary | ICD-10-CM | POA: Insufficient documentation

## 2015-02-02 DIAGNOSIS — M62838 Other muscle spasm: Secondary | ICD-10-CM | POA: Insufficient documentation

## 2015-02-02 DIAGNOSIS — Z79899 Other long term (current) drug therapy: Secondary | ICD-10-CM | POA: Insufficient documentation

## 2015-02-02 DIAGNOSIS — L02411 Cutaneous abscess of right axilla: Secondary | ICD-10-CM | POA: Insufficient documentation
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
## 2015-02-03 ENCOUNTER — Emergency Department (HOSPITAL_COMMUNITY)
Admission: EM | Admit: 2015-02-03 | Discharge: 2015-02-03 | Disposition: A | Discharge status: Home / Self Care | Payer: Self-pay | Attending: Emergency Medicine | Admitting: Emergency Medicine

## 2015-02-03 DIAGNOSIS — M62838 Other muscle spasm: Secondary | ICD-10-CM

## 2015-02-03 DIAGNOSIS — L0291 Cutaneous abscess, unspecified: Secondary | ICD-10-CM

## 2015-02-03 MED ORDER — ETODOLAC 400 MG PO TABS: 400.0000 mg | ORAL_TABLET | Freq: Two times a day (BID) | ORAL | Status: DC | PRN

## 2015-02-03 MED ORDER — SULFAMETHOXAZOLE-TRIMETHOPRIM 800-160 MG PO TABS: 1.0000 | ORAL_TABLET | Freq: Two times a day (BID) | ORAL | Status: AC

## 2015-02-03 MED ORDER — DEXAMETHASONE SODIUM PHOSPHATE 10 MG/ML IJ SOLN
10.0000 mg | Freq: Once | INTRAMUSCULAR | Status: DC
  Filled 2015-02-03: qty 1
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
## 2015-02-03 NOTE — ED Notes (Signed)
Pt verbalizes understanding of d/c instructions and denies any further needs at this time. 

## 2015-02-03 NOTE — Discharge Instructions (Signed)
Abscess An abscess is an infected area that contains a collection of pus and debris.It can occur in almost any part of the body. An abscess is also known as a furuncle or boil. CAUSES  An abscess occurs when tissue gets infected. This can occur from blockage of oil or sweat glands, infection of hair follicles, or a minor injury to the skin. As the body tries to fight the infection, pus collects in the area and creates pressure under the skin. This pressure causes pain. People with weakened immune systems have difficulty fighting infections and get certain abscesses more often.  SYMPTOMS Usually an abscess develops on the skin and becomes a painful mass that is red, warm, and tender. If the abscess forms under the skin, you may feel a moveable soft area under the skin. Some abscesses break open (rupture) on their own, but most will continue to get worse without care. The infection can spread deeper into the body and eventually into the bloodstream, causing you to feel ill.  DIAGNOSIS  Your caregiver will take your medical history and perform a physical exam. A sample of fluid may also be taken from the abscess to determine what is causing your infection. TREATMENT  Your caregiver may prescribe antibiotic medicines to fight the infection. However, taking antibiotics alone usually does not cure an abscess. Your caregiver may need to make a small cut (incision) in the abscess to drain the pus. In some cases, gauze is packed into the abscess to reduce pain and to continue draining the area. HOME CARE INSTRUCTIONS   Only take over-the-counter or prescription medicines for pain, discomfort, or fever as directed by your caregiver.  If you were prescribed antibiotics, take them as directed. Finish them even if you start to feel better.  If gauze is used, follow your caregiver's directions for changing the gauze.  To avoid spreading the infection:  Keep your draining abscess covered with a  bandage.  Wash your hands well.  Do not share personal care items, towels, or whirlpools with others.  Avoid skin contact with others.  Keep your skin and clothes clean around the abscess.  Keep all follow-up appointments as directed by your caregiver. SEEK MEDICAL CARE IF:   You have increased pain, swelling, redness, fluid drainage, or bleeding.  You have muscle aches, chills, or a general ill feeling.  You have a fever. MAKE SURE YOU:   Understand these instructions.  Will watch your condition.  Will get help right away if you are not doing well or get worse. Document Released: 04/18/2005 Document Revised: 01/08/2012 Document Reviewed: 09/21/2011 [facility] Patient Information 2015 [place]. This information is not intended to replace advice given to you by your health care provider. Make sure you discuss any questions you have with your health care provider.  Muscle Cramps and Spasms Muscle cramps and spasms occur when a muscle or muscles tighten and you have no control over this tightening (involuntary muscle contraction). They are a common problem and can develop in any muscle. The most common place is in the calf muscles of the leg. Both muscle cramps and muscle spasms are involuntary muscle contractions, but they also have differences:   Muscle cramps are sporadic and painful. They may last a few seconds to a quarter of an hour. Muscle cramps are often more forceful and last longer than muscle spasms.  Muscle spasms may or may not be painful. They may also last just a few seconds or much longer. CAUSES  It  is uncommon for cramps or spasms to be due to a serious underlying problem. In many cases, the cause of cramps or spasms is unknown. Some common causes are:   Overexertion.   Overuse from repetitive motions (doing the same thing over and over).   Remaining in a certain position for a long period of time.   Improper preparation, form, or technique while  performing a sport or activity.   Dehydration.   Injury.   Side effects of some medicines.   Abnormally low levels of the salts and ions in your blood (electrolytes), especially potassium and calcium. This could happen if you are taking water pills (diuretics) or you are pregnant.  Some underlying medical problems can make it more likely to develop cramps or spasms. These include, but are not limited to:   Diabetes.   Parkinson disease.   Hormone disorders, such as thyroid problems.   Alcohol abuse.   Diseases specific to muscles, joints, and bones.   Blood vessel disease where not enough blood is getting to the muscles.  HOME CARE INSTRUCTIONS   Stay well hydrated. Drink enough water and fluids to keep your urine clear or pale yellow.  It may be helpful to massage, stretch, and relax the affected muscle.  For tight or tense muscles, use a warm towel, heating pad, or hot shower water directed to the affected area.  If you are sore or have pain after a cramp or spasm, applying ice to the affected area may relieve discomfort.  Put ice in a plastic bag.  Place a towel between your skin and the bag.  Leave the ice on for 15-20 minutes, 03-04 times a day.  Medicines used to treat a known cause of cramps or spasms may help reduce their frequency or severity. Only take over-the-counter or prescription medicines as directed by your caregiver. SEEK MEDICAL CARE IF:  Your cramps or spasms get more severe, more frequent, or do not improve over time.  MAKE SURE YOU:   Understand these instructions.  Will watch your condition.  Will get help right away if you are not doing well or get worse. Document Released: 12/29/2001 Document Revised: 11/03/2012 Document Reviewed: 06/25/2012 [facility] Patient Information 2015 [place], [place]. This information is not intended to replace advice given to you by your health care provider. Make sure you discuss any questions you have with  your health care provider.

## 2015-02-28 ENCOUNTER — Encounter (HOSPITAL_COMMUNITY): Payer: Self-pay | Admitting: Emergency Medicine

## 2015-02-28 ENCOUNTER — Emergency Department (HOSPITAL_COMMUNITY)
Admission: EM | Admit: 2015-02-28 | Discharge: 2015-02-28 | Disposition: A | Discharge status: Home / Self Care | Payer: Self-pay | Attending: Emergency Medicine | Admitting: Emergency Medicine

## 2015-02-28 DIAGNOSIS — N4889 Other specified disorders of penis: Secondary | ICD-10-CM | POA: Insufficient documentation

## 2015-02-28 DIAGNOSIS — Z79899 Other long term (current) drug therapy: Secondary | ICD-10-CM | POA: Insufficient documentation

## 2015-02-28 DIAGNOSIS — Y9289 Other specified places as the place of occurrence of the external cause: Secondary | ICD-10-CM | POA: Insufficient documentation

## 2015-02-28 DIAGNOSIS — S46811A Strain of other muscles, fascia and tendons at shoulder and upper arm level, right arm, initial encounter: Secondary | ICD-10-CM

## 2015-02-28 DIAGNOSIS — Y9389 Activity, other specified: Secondary | ICD-10-CM | POA: Insufficient documentation

## 2015-02-28 DIAGNOSIS — L02411 Cutaneous abscess of right axilla: Secondary | ICD-10-CM | POA: Insufficient documentation

## 2015-02-28 DIAGNOSIS — X58XXXA Exposure to other specified factors, initial encounter: Secondary | ICD-10-CM | POA: Insufficient documentation

## 2015-02-28 DIAGNOSIS — S46911A Strain of unspecified muscle, fascia and tendon at shoulder and upper arm level, right arm, initial encounter: Secondary | ICD-10-CM | POA: Insufficient documentation

## 2015-02-28 DIAGNOSIS — Z792 Long term (current) use of antibiotics: Secondary | ICD-10-CM | POA: Insufficient documentation

## 2015-02-28 DIAGNOSIS — Y998 Other external cause status: Secondary | ICD-10-CM | POA: Insufficient documentation

## 2015-02-28 MED ORDER — HYDROCODONE-ACETAMINOPHEN 5-325 MG PO TABS
1.0000 | ORAL_TABLET | Freq: Once | ORAL | Status: AC
  Administered 2015-02-28: 1 via ORAL
  Filled 2015-02-28: qty 1

## 2015-02-28 MED ORDER — PREDNISONE 50 MG PO TABS: 50.0000 mg | ORAL_TABLET | Freq: Every day | ORAL | Status: DC

## 2015-02-28 MED ORDER — TRAMADOL HCL 50 MG PO TABS: 50.0000 mg | ORAL_TABLET | Freq: Four times a day (QID) | ORAL | Status: DC | PRN

## 2015-02-28 MED ORDER — HYDROCODONE-ACETAMINOPHEN 5-325 MG PO TABS: 1.0000 | ORAL_TABLET | Freq: Four times a day (QID) | ORAL | Status: DC | PRN

## 2015-02-28 MED ORDER — CYCLOBENZAPRINE HCL 10 MG PO TABS: 10.0000 mg | ORAL_TABLET | Freq: Three times a day (TID) | ORAL | Status: DC | PRN

## 2015-02-28 MED ORDER — CLOTRIMAZOLE-BETAMETHASONE 1-0.05 % EX CREA: TOPICAL_CREAM | CUTANEOUS | Status: DC

## 2015-02-28 MED ORDER — SULFAMETHOXAZOLE-TRIMETHOPRIM 800-160 MG PO TABS: 1.0000 | ORAL_TABLET | Freq: Two times a day (BID) | ORAL | Status: AC
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
## 2015-02-28 NOTE — ED Provider Notes (Signed)
CSN: [id number]     Arrival date & time 02/28/15  1859 History  This chart was scribed for [name], PA-C, working with Blake Divine, MD by Chestine Spore, ED Scribe. The patient was seen in room WTR9/WTR9 at 8:27 PM.    Chief Complaint  Patient presents with  . Neck Pain      The history is provided by the patient. No language interpreter was used.    HPI Comments: Dan Koch is a 34 y.o. male who presents to the Emergency Department complaining of right sided neck pain. Pt reports that he woke up this morning and began to have right sided neck pain. Pt describes his neck pain as a throbbing sensation. Pt secondarily complains of an abscess to his right axilla. Pt notes that he has had the area I&D 2 weeks ago at [facility] with drainage still.He states that he is having associated symptoms of nausea. He denies fever, color change, wound, and any other symptoms.   Past Medical History  Diagnosis Date  . Abscess    Past Surgical History  Procedure Laterality Date  . Hernia repair     History reviewed. No pertinent family history. History  Substance Use Topics  . Smoking status: Never Smoker   . Smokeless tobacco: Never Used  . Alcohol Use: No    Review of Systems  Constitutional: Negative for fever.  Gastrointestinal: Positive for nausea.  Musculoskeletal: Positive for neck pain.  Skin: Negative for color change, rash and wound.       Abscess to right axilla      Allergies  Strawberry and Tomato  Home Medications   Prior to Admission medications   Medication Sig Start Date End Date Taking? Authorizing Provider  cephALEXin (KEFLEX) 500 MG capsule Take 1 capsule (500 mg total) by mouth 4 (four) times daily. 01/27/15   Roxy Horseman, PA-C  clindamycin (CLEOCIN) 300 MG capsule Take 1 capsule (300 mg total) by mouth 4 (four) times daily. X 10 days 01/22/15   Oswaldo Conroy, PA-C  clindamycin [facility]) 1 % lotion Apply topically 2 (two) times daily. 01/22/15    Oswaldo Conroy, PA-C  etodolac (LODINE) 400 MG tablet Take 1 tablet (400 mg total) by mouth 2 (two) times daily as needed for moderate pain. 02/03/15   Gilda Crease, MD  HYDROcodone-acetaminophen (NORCO/VICODIN) 5-325 MG per tablet Take 1 tablet by mouth every 6 (six) hours as needed. 01/22/15   Oswaldo Conroy, PA-C  ibuprofen (ADVIL,MOTRIN) 600 MG tablet Take 1 tablet (600 mg total) by mouth every 6 (six) hours as needed. 01/18/15   Renne Crigler, PA-C  methocarbamol (ROBAXIN) 500 MG tablet Take 2 tablets (1,000 mg total) by mouth 4 (four) times daily. 01/18/15   Renne Crigler, PA-C  oxyCODONE-acetaminophen (PERCOCET/ROXICET) 5-325 MG per tablet Take 1-2 tablets by mouth every 6 (six) hours as needed for severe pain. 01/27/15   Roxy Horseman, PA-C   BP 132/76 mmHg  Pulse 93  Temp(Src) 99.9 F (37.7 C) (Oral)  Resp 17  Ht  (1.803 m)  Wt 240 lb (108.863 kg)  BMI 33.49 kg/m2  SpO2 100% Physical Exam  Constitutional: He is oriented to person, place, and time. He appears well-developed and well-nourished. No distress.  HENT:  Head: Normocephalic and atraumatic.  Eyes: Pupils are equal, round, and reactive to light.  Neck: Normal range of motion. Neck supple.  Pulmonary/Chest: Effort normal. No respiratory distress.  Genitourinary: No discharge found.  Chaffing of skin around the head of  the penis.  Musculoskeletal: Normal range of motion.  Palpable pain over the right trapezius muscle. Abscess that was draining to right axilla.  Neurological: He is alert and oriented to person, place, and time. He exhibits normal muscle tone. Coordination normal.  Skin: Skin is warm and dry.  Psychiatric: He has a normal mood and affect. His behavior is normal.  Nursing note and vitals reviewed.   ED Course  Procedures (including critical care time) DIAGNOSTIC STUDIES: Oxygen Saturation is 100% on RA, nl by my interpretation.    COORDINATION OF CARE: 8:28 PM- Patient declined I&D of  right axilla at this time.  8:32 PM Discussed treatment plan with pt at bedside and pt agreed to plan.  8:42 PM- Patient called Ebbie Ridge, PA-C back into the room, due to there being too many females in the room for a CC of irritation to the head of his penis without any otherassociated symptoms.  Patient will be treated for cervical strain. The patient is advised to return here as needed. Told to follow up with his orthopedist about his neck. The patient will be advised to use ice and heat on his neck.  I personally performed the services described in this documentation, which was scribed in my presence. The recorded information has been reviewed and is accurate.    [name], PA-C 03/01/15 1610  Blake Divine, MD 03/03/15 [phone]

## 2015-02-28 NOTE — Discharge Instructions (Signed)
Return here as needed. Follow up with your doctor and the surgeon provided. Use warm compresses on the abscess

## 2015-03-16 ENCOUNTER — Encounter (HOSPITAL_COMMUNITY): Payer: Self-pay | Admitting: *Deleted

## 2015-03-16 ENCOUNTER — Emergency Department (HOSPITAL_COMMUNITY)
Admission: EM | Admit: 2015-03-16 | Discharge: 2015-03-16 | Disposition: A | Discharge status: Home / Self Care | Payer: Self-pay | Attending: Emergency Medicine | Admitting: Emergency Medicine

## 2015-03-16 DIAGNOSIS — Z872 Personal history of diseases of the skin and subcutaneous tissue: Secondary | ICD-10-CM | POA: Insufficient documentation

## 2015-03-16 DIAGNOSIS — L0231 Cutaneous abscess of buttock: Secondary | ICD-10-CM | POA: Insufficient documentation

## 2015-03-16 DIAGNOSIS — Z7952 Long term (current) use of systemic steroids: Secondary | ICD-10-CM | POA: Insufficient documentation

## 2015-03-16 DIAGNOSIS — Z79899 Other long term (current) drug therapy: Secondary | ICD-10-CM | POA: Insufficient documentation

## 2015-03-16 DIAGNOSIS — Z792 Long term (current) use of antibiotics: Secondary | ICD-10-CM | POA: Insufficient documentation

## 2015-03-16 MED ORDER — LIDOCAINE HCL (PF) 1 % IJ SOLN
30.0000 mL | Freq: Once | INTRAMUSCULAR | Status: AC
  Administered 2015-03-16: 30 mL via INTRADERMAL
  Filled 2015-03-16: qty 30

## 2015-03-16 MED ORDER — CLINDAMYCIN HCL 150 MG PO CAPS: 300.0000 mg | ORAL_CAPSULE | Freq: Three times a day (TID) | ORAL | Status: DC

## 2015-03-16 MED ORDER — CLINDAMYCIN HCL 300 MG PO CAPS
300.0000 mg | ORAL_CAPSULE | Freq: Once | ORAL | Status: AC
  Administered 2015-03-16: 300 mg via ORAL
  Filled 2015-03-16: qty 1

## 2015-03-16 MED ORDER — HYDROCODONE-ACETAMINOPHEN 5-325 MG PO TABS
2.0000 | ORAL_TABLET | Freq: Once | ORAL | Status: AC
  Administered 2015-03-16: 2 via ORAL
  Filled 2015-03-16: qty 2

## 2015-03-16 NOTE — Discharge Instructions (Signed)
Take the prescribed medication as directed.  Make sure to change dressings daily, clean area with soap and warm water. [facility] call to make appt. Return to the ED for new or worsening symptoms.

## 2015-03-16 NOTE — ED Provider Notes (Signed)
CSN: [id number]     Arrival date & time 03/16/15  1756 History   First MD Initiated Contact with Patient 03/16/15 1908     Chief Complaint  Patient presents with  . Abscess     (Consider location/radiation/quality/duration/timing/severity/associated sxs/prior Treatment) The history is provided by the patient and medical records.    This is a 34 year old male with past medical history significant for hidradenitis suppurativa, presenting to the ED for right buttock abscess. He states this initially began developing 10 days ago and has gotten progressively larger since this time. Has a history of recurrent abscesses.  No history of MRSA. He states he has had multiple I&D's. He denies any recent fever or chills. Patient has never been evaluated by surgery for this as he is uninsured and cannot afford to pay out of pocket.  He was recently on bactrim, however states clindamycin notoriously works better for him.  Patient does not currently have a PCP.  VSS.  Past Medical History  Diagnosis Date  . Abscess    Past Surgical History  Procedure Laterality Date  . Hernia repair     History reviewed. No pertinent family history. Social History  Substance Use Topics  . Smoking status: Never Smoker   . Smokeless tobacco: Never Used  . Alcohol Use: No    Review of Systems  Skin:       Abscess  All other systems reviewed and are negative.     Allergies  Strawberry and Tomato  Home Medications   Prior to Admission medications   Medication Sig Start Date End Date Taking? Authorizing Provider  cephALEXin (KEFLEX) 500 MG capsule Take 1 capsule (500 mg total) by mouth 4 (four) times daily. 01/27/15   Montine Circle, PA-C  clindamycin (CLEOCIN) 300 MG capsule Take 1 capsule (300 mg total) by mouth 4 (four) times daily. X 10 days 01/22/15   Al Corpus, PA-C  clindamycin [facility]) 1 % lotion Apply topically 2 (two) times daily. 01/22/15   Al Corpus, PA-C  clotrimazole-betamethasone  (LOTRISONE) cream Apply to affected area 2 times daily prn 02/28/15   Dalia Heading, PA-C  cyclobenzaprine (FLEXERIL) 10 MG tablet Take 1 tablet (10 mg total) by mouth 3 (three) times daily as needed for muscle spasms. 02/28/15   Dalia Heading, PA-C  etodolac (LODINE) 400 MG tablet Take 1 tablet (400 mg total) by mouth 2 (two) times daily as needed for moderate pain. 02/03/15   Orpah Greek, MD  HYDROcodone-acetaminophen (NORCO/VICODIN) 5-325 MG per tablet Take 1 tablet by mouth every 6 (six) hours as needed for moderate pain. 02/28/15   Dalia Heading, PA-C  ibuprofen (ADVIL,MOTRIN) 600 MG tablet Take 1 tablet (600 mg total) by mouth every 6 (six) hours as needed. 01/18/15   Carlisle Cater, PA-C  methocarbamol (ROBAXIN) 500 MG tablet Take 2 tablets (1,000 mg total) by mouth 4 (four) times daily. 01/18/15   Carlisle Cater, PA-C  oxyCODONE-acetaminophen (PERCOCET/ROXICET) 5-325 MG per tablet Take 1-2 tablets by mouth every 6 (six) hours as needed for severe pain. 01/27/15   Montine Circle, PA-C  predniSONE (DELTASONE) 50 MG tablet Take 1 tablet (50 mg total) by mouth daily. 02/28/15   Christopher Lawyer, PA-C   BP 147/92 mmHg  Pulse 107  Temp(Src) 98.7 F (37.1 C) (Oral)  Resp 18  SpO2 99%   Physical Exam  Constitutional: He is oriented to person, place, and time. He appears well-developed and well-nourished. No distress.  HENT:  Head: Normocephalic and atraumatic.  Mouth/Throat: Oropharynx  is clear and moist.  Eyes: Conjunctivae and EOM are normal. Pupils are equal, round, and reactive to light.  Neck: Normal range of motion. Neck supple.  Cardiovascular: Normal rate, regular rhythm and normal heart sounds.   Pulmonary/Chest: Effort normal and breath sounds normal. No respiratory distress. He has no wheezes.  Genitourinary:  Moderate sized abscess of right medial buttock that is not directly peri-rectal; fluctuance noted without active drainage; no surrounding cellulitis or  induration Left buttock has multiple old I&D incisions; scar tissue noted  Musculoskeletal: Normal range of motion. He exhibits no edema.  Neurological: He is alert and oriented to person, place, and time.  Skin: Skin is warm and dry. He is not diaphoretic.  Psychiatric: He has a normal mood and affect.  Nursing note and vitals reviewed.   ED Course  Procedures (including critical care time)  INCISION AND DRAINAGE Performed by: Larene Pickett Consent: Verbal consent obtained. Risks and benefits: risks, benefits and alternatives were discussed Type: abscess  Body area: right buttock  Anesthesia: local infiltration  Incision was made with a scalpel.  Local anesthetic: lidocaine 1% without epinephrine  Anesthetic total: 5 ml  Complexity: complex Blunt dissection to break up loculations  Drainage: purulent  Drainage amount: large  Packing material: none  Patient tolerance: Patient tolerated the procedure well with no immediate complications.   Labs Review Labs Reviewed - No data to display  Imaging Review No results found.    EKG Interpretation None      MDM   Final diagnoses:  Abscess of buttock, right   34 year old male here with right buttock abscess. He has a history of recurrent abscesses. Patient is afebrile, nontoxic. He does have a moderate size abscess of right medial buttock, this is not directly perirectal.  There is central fluctuance without active drainage. His left buttock has multiple prior I&D incisions still visible with scar tissue noted. There does not appear to be any significant cellulitis.  I&D performed as above, patient tolerated well. He has had issues getting medications in the past, case management has met with him and brought him in match program for discounted abx.  Will start on clindamycin as this has worked well for him in the past.  Patient has been [facility].  I have instructed him on home wound care and  given him supplies to change dressings.  Discussed plan with patient, he/she acknowledged understanding and agreed with plan of care.  Return precautions given for new or worsening symptoms.  Larene Pickett, PA-C 03/16/15 [place], DO 03/18/15 1357

## 2015-03-16 NOTE — ED Notes (Signed)
Patient here due to having several abscess.  States he has had several and they keep coming back.  States the Clindamycin works but the other antibiotics do not work.

## 2015-03-16 NOTE — ED Notes (Signed)
All items at bedside

## 2015-03-16 NOTE — ED Notes (Signed)
Pt reports having an abscess on buttocks x 2 weeks.

## 2015-03-16 NOTE — Care Management (Signed)
ED CM consulted by L. Baird Cancer PA-C concerning medication assistance. Patient presented to [facility] ED for recurrent generalized abscesses., patient will need po antibiotic upon discharge.   Reviewed record, no health insurance listed.Met with patient at bedside,   Discussed MATCH program and the guidelines including the  $3 co-pay per prescription. Patient is eligible for [facility] medication assistance program.  Pt enrolled and [place] letter printed and given to patient with list of participating pharmacies and instructions.  Pt verbalized understanding and is agreeable with plan.  Patient  prescriptions with [facility] letter and instruction on how to redeem medications. Pt voiced understanding and appreciation. Reminded patient of the importance of following up with PCP. Patient given information about the [facility].  Pt verbalized understanding teach back done. Updated EDP and Pamala Hurry RN on Pod A they are  agreeable with plan. No further ED CM needs identified.

## 2015-03-31 ENCOUNTER — Inpatient Hospital Stay: Payer: Self-pay | Admitting: Family Medicine

## 2015-04-12 ENCOUNTER — Encounter: Payer: Self-pay | Admitting: Family Medicine

## 2015-04-12 ENCOUNTER — Ambulatory Visit: Payer: Self-pay | Attending: Family Medicine | Admitting: Family Medicine

## 2015-04-12 VITALS — BP 135/82 | HR 89 | Temp 98.2°F | Ht 71.0 in | Wt 225.0 lb

## 2015-04-12 DIAGNOSIS — L732 Hidradenitis suppurativa: Secondary | ICD-10-CM | POA: Insufficient documentation

## 2015-04-12 DIAGNOSIS — L0231 Cutaneous abscess of buttock: Secondary | ICD-10-CM | POA: Insufficient documentation

## 2015-04-12 DIAGNOSIS — Z7952 Long term (current) use of systemic steroids: Secondary | ICD-10-CM | POA: Insufficient documentation

## 2015-04-12 DIAGNOSIS — Z79899 Other long term (current) drug therapy: Secondary | ICD-10-CM | POA: Insufficient documentation

## 2015-04-12 LAB — CBC WITH DIFFERENTIAL/PLATELET
BASOS PCT: 0 % (ref 0–1)
Basophils Absolute: 0 10*3/uL (ref 0.0–0.1)
EOS ABS: 0.1 10*3/uL (ref 0.0–0.7)
EOS PCT: 1 % (ref 0–5)
HCT: 33.9 % — ABNORMAL LOW (ref 39.0–52.0)
Hemoglobin: 10.3 g/dL — ABNORMAL LOW (ref 13.0–17.0)
LYMPHS ABS: 1.6 10*3/uL (ref 0.7–4.0)
Lymphocytes Relative: 13 % (ref 12–46)
MCH: 20.1 pg — AB (ref 26.0–34.0)
MCHC: 30.4 g/dL (ref 30.0–36.0)
MCV: 66.1 fL — AB (ref 78.0–100.0)
MONO ABS: 1.1 10*3/uL — AB (ref 0.1–1.0)
MONOS PCT: 9 % (ref 3–12)
MPV: 9.2 fL (ref 8.6–12.4)
Neutro Abs: 9.4 10*3/uL — ABNORMAL HIGH (ref 1.7–7.7)
Neutrophils Relative %: 77 % (ref 43–77)
PLATELETS: 424 10*3/uL — AB (ref 150–400)
RBC: 5.13 MIL/uL (ref 4.22–5.81)
RDW: 17.9 % — AB (ref 11.5–15.5)
WBC: 12.2 10*3/uL — ABNORMAL HIGH (ref 4.0–10.5)

## 2015-04-12 LAB — BASIC METABOLIC PANEL
BUN: 6 mg/dL — ABNORMAL LOW (ref 7–25)
CALCIUM: 8.9 mg/dL (ref 8.6–10.3)
CO2: 27 mmol/L (ref 20–31)
CREATININE: 0.84 mg/dL (ref 0.60–1.35)
Chloride: 102 mmol/L (ref 98–110)
Glucose, Bld: 92 mg/dL (ref 65–99)
Potassium: 4.2 mmol/L (ref 3.5–5.3)
SODIUM: 139 mmol/L (ref 135–146)

## 2015-04-12 MED ORDER — ACETAMINOPHEN-CODEINE #3 300-30 MG PO TABS: 1.0000 | ORAL_TABLET | Freq: Four times a day (QID) | ORAL | Status: DC | PRN

## 2015-04-12 MED ORDER — CEPHALEXIN 500 MG PO CAPS: 500.0000 mg | ORAL_CAPSULE | Freq: Four times a day (QID) | ORAL | Status: DC

## 2015-04-12 NOTE — Progress Notes (Signed)
Patient has multiple abscess' on his face,right arm,scrotum, and buttocks He reports 10/10 pain and has no pain medication He reports going to the ED for an abscess on his buttock that was lanced and he was given clindamycin-it never cleared up

## 2015-04-12 NOTE — Patient Instructions (Signed)
Hidradenitis Suppurativa, Sweat Gland Abscess Hidradenitis suppurativa is a long lasting (chronic), uncommon disease of the sweat glands. With this, boil-like lumps and scarring develop in the groin, some times under the arms (axillae), and under the breasts. It may also uncommonly occur behind the ears, in the crease of the buttocks, and around the genitals.  CAUSES  The cause is from a blocking of the sweat glands. They then become infected. It may cause drainage and odor. It is not contagious. So it cannot be given to someone else. It most often shows up in puberty (about 10 to 33 years of age). But it may happen much later. It is similar to acne which is a disease of the sweat glands. This condition is slightly more common in African-Americans and women. SYMPTOMS   Hidradenitis usually starts as one or more red, tender, swellings in the groin or under the arms (axilla).  Over a period of hours to days the lesions get larger. They often open to the skin surface, draining clear to yellow-colored fluid.  The infected area heals with scarring. DIAGNOSIS  Your caregiver makes this diagnosis by looking at you. Sometimes cultures (growing germs on plates in the lab) may be taken. This is to see what germ (bacterium) is causing the infection.  TREATMENT   Topical germ killing medicine applied to the skin (antibiotics) are the treatment of choice. Antibiotics taken by mouth (systemic) are sometimes needed when the condition is getting worse or is severe.  Avoid tight-fitting clothing which traps moisture in.  Dirt does not cause hidradenitis and it is not caused by poor hygiene.  Involved areas should be cleaned daily using an antibacterial soap. Some patients find that the liquid form of Lever 2000, applied to the involved areas as a lotion after bathing, can help reduce the odor related to this condition.  Sometimes surgery is needed to drain infected areas or remove scarred tissue. Removal of  large amounts of tissue is used only in severe cases.  Birth control pills may be helpful.  Oral retinoids (vitamin A derivatives) for 6 to 12 months which are effective for acne may also help this condition.  Weight loss will improve but not cure hidradenitis. It is made worse by being overweight. But the condition is not caused by being overweight.  This condition is more common in people who have had acne.  It may become worse under stress. There is no medical cure for hidradenitis. It can be controlled, but not cured. The condition usually continues for years with periods of getting worse and getting better (remission). Document Released: 02/21/2004 Document Revised: 10/01/2011 Document Reviewed: 10/09/2013 ExitCare Patient Information 2015 [employer]. This information is not intended to replace advice given to you by your health care provider. Make sure you discuss any questions you have with your health care provider.  

## 2015-04-13 ENCOUNTER — Telehealth: Payer: Self-pay | Admitting: *Deleted

## 2015-04-13 ENCOUNTER — Telehealth: Payer: Self-pay | Admitting: General Practice

## 2015-04-13 NOTE — Telephone Encounter (Signed)
Patient called to say he had done what he was instructed to do at his appointment yesterday with the warm bath with Epsom salts and that it really helped him feel better.  He was calling to thank our staff for being so nice and helpful.

## 2015-04-13 NOTE — Progress Notes (Signed)
Subjective:  Patient ID: Dan Koch, male    DOB: [birth year]  Age: 34 y.o. MRN: [id number]  CC: Recurrent Skin Infections   HPI Dan Koch presents for a follow-up on hydradenitis suppurativa for which he was recently seen in the emergency room on 03/16/15 where he had some abscesses on his buttock were drained and he was discharged home on clindamycin.  Today he complains of pain at the site of all the abscesses as well as drainage. He has had hidradenitis suppurativa for the last 10 years and has never been to see a dermatologist or surgeon because he has no insurance.  Outpatient Prescriptions Prior to Visit  Medication Sig Dispense Refill  . ibuprofen (ADVIL,MOTRIN) 200 MG tablet Take 800 mg by mouth every 6 (six) hours as needed for moderate pain.    Marland Kitchen ibuprofen (ADVIL,MOTRIN) 600 MG tablet Take 1 tablet (600 mg total) by mouth every 6 (six) hours as needed. (Patient not taking: Reported on 04/12/2015) 20 tablet 0  . methocarbamol (ROBAXIN) 500 MG tablet Take 2 tablets (1,000 mg total) by mouth 4 (four) times daily. (Patient not taking: Reported on 04/12/2015) 30 tablet 0  . oxyCODONE-acetaminophen (PERCOCET/ROXICET) 5-325 MG per tablet Take 1-2 tablets by mouth every 6 (six) hours as needed for severe pain. (Patient not taking: Reported on 04/12/2015) 10 tablet 0  . predniSONE (DELTASONE) 50 MG tablet Take 1 tablet (50 mg total) by mouth daily. (Patient not taking: Reported on 04/12/2015) 5 tablet 0  . cephALEXin (KEFLEX) 500 MG capsule Take 1 capsule (500 mg total) by mouth 4 (four) times daily. (Patient not taking: Reported on 04/12/2015) 40 capsule 0  . clindamycin (CLEOCIN) 150 MG capsule Take 2 capsules (300 mg total) by mouth 3 (three) times daily. May dispense as  capsules (Patient not taking: Reported on 04/12/2015) 60 capsule 0  . clindamycin (CLINDAMAX) 1 % lotion Apply topically 2 (two) times daily. (Patient not taking: Reported on 04/12/2015) 60 mL 0  .  clotrimazole-betamethasone (LOTRISONE) cream Apply to affected area 2 times daily prn (Patient not taking: Reported on 04/12/2015) 15 g 0  . cyclobenzaprine (FLEXERIL) 10 MG tablet Take 1 tablet (10 mg total) by mouth 3 (three) times daily as needed for muscle spasms. (Patient not taking: Reported on 04/12/2015) 15 tablet 0  . etodolac (LODINE) 400 MG tablet Take 1 tablet (400 mg total) by mouth 2 (two) times daily as needed for moderate pain. (Patient not taking: Reported on 04/12/2015) 14 tablet 0  . HYDROcodone-acetaminophen (NORCO/VICODIN) 5-325 MG per tablet Take 1 tablet by mouth every 6 (six) hours as needed for moderate pain. (Patient not taking: Reported on 04/12/2015) 15 tablet 0   No facility-administered medications prior to visit.    ROS Review of Systems  Constitutional: Negative for activity change and appetite change.  HENT: Negative for sinus pressure and sore throat.   Eyes: Negative for visual disturbance.  Respiratory: Negative for cough, chest tightness and shortness of breath.   Cardiovascular: Negative for chest pain and leg swelling.  Gastrointestinal: Negative for abdominal pain, diarrhea, constipation and abdominal distention.  Endocrine: Negative.   Genitourinary: Negative for dysuria.  Musculoskeletal: Negative for myalgias and joint swelling.  Skin:       See hpi  Allergic/Immunologic: Negative.   Neurological: Negative for weakness, light-headedness and numbness.  Psychiatric/Behavioral: Negative for suicidal ideas and dysphoric mood.    Objective:  BP 135/82 mmHg  Pulse 89  Temp(Src) 98.2 F (36.8 C)  Ht 5'  11" (1.803 m)  Wt 225 lb (102.059 kg)  BMI 31.39 kg/m2  SpO2 99%  BP/Weight 04/12/2015 03/16/2015 02/28/2015  Systolic BP 135 134 145  Diastolic BP 82 83 89  Wt. (Lbs) 225 - 240  BMI 31.39 - 33.49      Physical Exam  Constitutional: He is oriented to person, place, and time. He appears well-developed and well-nourished.  Cardiovascular: Normal  rate, normal heart sounds and intact distal pulses.   No murmur heard. Pulmonary/Chest: Effort normal and breath sounds normal. He has no wheezes. He has no rales. He exhibits no tenderness.  Abdominal: Soft. Bowel sounds are normal. He exhibits no distension and no mass. There is no tenderness.  Musculoskeletal: Normal range of motion.  Neurological: He is alert and oriented to person, place, and time.  Skin:  Multiple draining abscesses on right cheek right axilla bilateral buttocks, scrotum.     Assessment & Plan:   1. Hydradenitis Dressing performed in the clinic. He has been advised on sitz bath for the buttock abscesses and apply warm compresses to all the other abscesses. Also encouraged to obtain to [facility] card and [facility] discount to enable [place] refer him to a Development worker, international aid. - cephALEXin (KEFLEX) 500 MG capsule; Take 1 capsule (500 mg total) by mouth 4 (four) times daily.  Dispense: 40 capsule; Refill: 0 - acetaminophen-codeine (TYLENOL #3) 300-30 MG per tablet; Take 1 tablet by mouth every 6 (six) hours as needed for moderate pain.  Dispense: 40 tablet; Refill: 0 - Ambulatory referral to General Surgery - CBC with Differential - Basic Metabolic Panel - Culture, routine-abscess   Meds ordered this encounter  Medications  . cephALEXin (KEFLEX) 500 MG capsule    Sig: Take 1 capsule (500 mg total) by mouth 4 (four) times daily.    Dispense:  40 capsule    Refill:  0  . acetaminophen-codeine (TYLENOL #3) 300-30 MG per tablet    Sig: Take 1 tablet by mouth every 6 (six) hours as needed for moderate pain.    Dispense:  40 tablet    Refill:  0    Follow-up: Return in about 3 weeks (around 05/03/2015) for PCP-to establish and follow up on hydradenitis.Jaclyn Shaggy MD

## 2015-04-13 NOTE — Telephone Encounter (Signed)
Patient called requesting his culture results. Please f/u with pt.

## 2015-04-15 ENCOUNTER — Ambulatory Visit: Payer: Self-pay | Attending: Internal Medicine

## 2015-04-17 LAB — CULTURE, ROUTINE-ABSCESS: Gram Stain: NONE SEEN

## 2015-04-18 ENCOUNTER — Other Ambulatory Visit: Payer: Self-pay | Admitting: Family Medicine

## 2015-04-18 DIAGNOSIS — Z22322 Carrier or suspected carrier of Methicillin resistant Staphylococcus aureus: Secondary | ICD-10-CM | POA: Insufficient documentation

## 2015-04-18 MED ORDER — SULFAMETHOXAZOLE-TRIMETHOPRIM 800-160 MG PO TABS: 1.0000 | ORAL_TABLET | Freq: Two times a day (BID) | ORAL | Status: DC

## 2015-04-19 ENCOUNTER — Telehealth: Payer: Self-pay | Admitting: *Deleted

## 2015-04-19 NOTE — Telephone Encounter (Signed)
Verified name and date and gave results.  Patient verified understanding

## 2015-04-19 NOTE — Telephone Encounter (Signed)
Spoke with patient and gave results that his culture was positive for MRSA and that he needed to stop taking the Keflex and start taking the Bactrim.  Patient will pick up the Bactrim tomorrow and patient transferred to [facility] to make follow up appointment with Dr. Venetia Night in 2 weeks.  Patient in agreement and verbalized understanding.

## 2015-04-19 NOTE — Telephone Encounter (Signed)
-----   Message from Jaclyn Shaggy, MD sent at 04/18/2015  8:41 AM EDT ----- Abscess culture is positive for MRSA; d/c Keflex and commence Bactrim which i have sent to his pharmacy.

## 2015-04-19 NOTE — Telephone Encounter (Signed)
Called phone number listed in chart and voice mail said "you have reached Dan Koch."  Left HIPAA compliant message that RN was looking for patient and please call [phone]

## 2015-04-19 NOTE — Telephone Encounter (Signed)
-----   Message from Jaclyn Shaggy, MD sent at 04/13/2015 10:47 PM EDT ----- Labs reveal anemia which is stable compared to 5 months ago which could be secondary to his chronic condition; advised to increase intake of iron rich foods.

## 2015-05-04 ENCOUNTER — Ambulatory Visit: Payer: Self-pay | Admitting: Family Medicine

## 2015-05-06 ENCOUNTER — Encounter (HOSPITAL_COMMUNITY): Payer: Self-pay | Admitting: Family Medicine

## 2015-05-06 ENCOUNTER — Emergency Department (HOSPITAL_COMMUNITY)
Admission: EM | Admit: 2015-05-06 | Discharge: 2015-05-06 | Disposition: A | Discharge status: Home / Self Care | Payer: Self-pay | Attending: Emergency Medicine | Admitting: Emergency Medicine

## 2015-05-06 ENCOUNTER — Emergency Department (HOSPITAL_COMMUNITY): Payer: Self-pay

## 2015-05-06 DIAGNOSIS — N492 Inflammatory disorders of scrotum: Secondary | ICD-10-CM | POA: Insufficient documentation

## 2015-05-06 DIAGNOSIS — L0201 Cutaneous abscess of face: Secondary | ICD-10-CM | POA: Insufficient documentation

## 2015-05-06 DIAGNOSIS — Z79899 Other long term (current) drug therapy: Secondary | ICD-10-CM | POA: Insufficient documentation

## 2015-05-06 DIAGNOSIS — M65059 Abscess of tendon sheath, unspecified thigh: Secondary | ICD-10-CM | POA: Insufficient documentation

## 2015-05-06 DIAGNOSIS — L0231 Cutaneous abscess of buttock: Secondary | ICD-10-CM | POA: Insufficient documentation

## 2015-05-06 DIAGNOSIS — L748 Other eccrine sweat disorders: Secondary | ICD-10-CM | POA: Insufficient documentation

## 2015-05-06 DIAGNOSIS — Z792 Long term (current) use of antibiotics: Secondary | ICD-10-CM | POA: Insufficient documentation

## 2015-05-06 DIAGNOSIS — L732 Hidradenitis suppurativa: Secondary | ICD-10-CM | POA: Insufficient documentation

## 2015-05-06 LAB — CBC WITH DIFFERENTIAL/PLATELET
BASOS PCT: 0 %
Basophils Absolute: 0 10*3/uL (ref 0.0–0.1)
EOS ABS: 0.2 10*3/uL (ref 0.0–0.7)
Eosinophils Relative: 2 %
HCT: 31.2 % — ABNORMAL LOW (ref 39.0–52.0)
Hemoglobin: 9.5 g/dL — ABNORMAL LOW (ref 13.0–17.0)
Lymphocytes Relative: 16 %
Lymphs Abs: 1.6 10*3/uL (ref 0.7–4.0)
MCH: 19.5 pg — AB (ref 26.0–34.0)
MCHC: 30.4 g/dL (ref 30.0–36.0)
MCV: 64.2 fL — ABNORMAL LOW (ref 78.0–100.0)
MONO ABS: 1 10*3/uL (ref 0.1–1.0)
Monocytes Relative: 10 %
NEUTROS PCT: 72 %
Neutro Abs: 7.5 10*3/uL (ref 1.7–7.7)
PLATELETS: 384 10*3/uL (ref 150–400)
RBC: 4.86 MIL/uL (ref 4.22–5.81)
RDW: 16.8 % — ABNORMAL HIGH (ref 11.5–15.5)
WBC: 10.3 10*3/uL (ref 4.0–10.5)

## 2015-05-06 LAB — COMPREHENSIVE METABOLIC PANEL
ALT: 38 U/L (ref 17–63)
AST: 27 U/L (ref 15–41)
Albumin: 2.8 g/dL — ABNORMAL LOW (ref 3.5–5.0)
Alkaline Phosphatase: 79 U/L (ref 38–126)
Anion gap: 7 (ref 5–15)
BUN: 11 mg/dL (ref 6–20)
CHLORIDE: 101 mmol/L (ref 101–111)
CO2: 30 mmol/L (ref 22–32)
CREATININE: 1.08 mg/dL (ref 0.61–1.24)
Calcium: 8.9 mg/dL (ref 8.9–10.3)
GFR calc non Af Amer: >60 mL/min (ref >60)
GLUCOSE: 106 mg/dL — AB (ref 65–99)
POTASSIUM: 3.4 mmol/L — AB (ref 3.5–5.1)
SODIUM: 138 mmol/L (ref 135–145)
TOTAL PROTEIN: 8 g/dL (ref 6.5–8.1)
Total Bilirubin: 0.5 mg/dL (ref 0.3–1.2)

## 2015-05-06 LAB — I-STAT CHEM 8, ED
BUN: 13 mg/dL (ref 6–20)
CALCIUM ION: 1.15 mmol/L (ref 1.12–1.23)
CHLORIDE: 101 mmol/L (ref 101–111)
Creatinine, Ser: 1 mg/dL (ref 0.61–1.24)
GLUCOSE: 102 mg/dL — AB (ref 65–99)
HCT: 34 % — ABNORMAL LOW (ref 39.0–52.0)
Hemoglobin: 11.6 g/dL — ABNORMAL LOW (ref 13.0–17.0)
Potassium: 3.5 mmol/L (ref 3.5–5.1)
SODIUM: 140 mmol/L (ref 135–145)
TCO2: 26 mmol/L (ref 0–100)

## 2015-05-06 MED ORDER — ONDANSETRON HCL 4 MG/2ML IJ SOLN
4.0000 mg | Freq: Once | INTRAMUSCULAR | Status: AC
  Administered 2015-05-06: 4 mg via INTRAVENOUS
  Filled 2015-05-06: qty 2

## 2015-05-06 MED ORDER — MUPIROCIN CALCIUM 2 % NA OINT: TOPICAL_OINTMENT | NASAL | Status: DC

## 2015-05-06 MED ORDER — SODIUM CHLORIDE 0.9 % IV BOLUS (SEPSIS)
1000.0000 mL | Freq: Once | INTRAVENOUS | Status: AC
  Administered 2015-05-06: 1000 mL via INTRAVENOUS

## 2015-05-06 MED ORDER — IOHEXOL 300 MG/ML  SOLN
100.0000 mL | Freq: Once | INTRAMUSCULAR | Status: AC | PRN
  Administered 2015-05-06: 100 mL via INTRAVENOUS

## 2015-05-06 MED ORDER — OXYCODONE-ACETAMINOPHEN 5-325 MG PO TABS: 1.0000 | ORAL_TABLET | ORAL | Status: DC | PRN

## 2015-05-06 MED ORDER — FLUCONAZOLE 150 MG PO TABS: 150.0000 mg | ORAL_TABLET | ORAL | Status: AC

## 2015-05-06 MED ORDER — IOHEXOL 300 MG/ML  SOLN
25.0000 mL | Freq: Once | INTRAMUSCULAR | Status: AC | PRN
  Administered 2015-05-06: 25 mL via ORAL

## 2015-05-06 MED ORDER — FENTANYL CITRATE (PF) 100 MCG/2ML IJ SOLN
100.0000 ug | Freq: Once | INTRAMUSCULAR | Status: AC
  Administered 2015-05-06: 100 ug via INTRAVENOUS
  Filled 2015-05-06: qty 2

## 2015-05-06 MED ORDER — TRAMADOL HCL 50 MG PO TABS: 50.0000 mg | ORAL_TABLET | Freq: Four times a day (QID) | ORAL | Status: DC | PRN

## 2015-05-06 MED ORDER — DOXYCYCLINE HYCLATE 100 MG PO CAPS: 100.0000 mg | ORAL_CAPSULE | Freq: Two times a day (BID) | ORAL | Status: DC

## 2015-05-06 MED ORDER — LIDOCAINE HCL (PF) 1 % IJ SOLN
10.0000 mL | Freq: Once | INTRAMUSCULAR | Status: DC
  Filled 2015-05-06: qty 10

## 2015-05-06 NOTE — ED Notes (Signed)
Pt done with contrast, CT notified 

## 2015-05-06 NOTE — ED Provider Notes (Signed)
CSN: [id number]     Arrival date & time 05/06/15  1551 History  By signing my name below, I, Emmanuella Mensah, attest that this documentation has been prepared under the direction and in the presence of Arthor Captain, PA-C. Electronically Signed: Angelene Giovanni, ED Scribe. 05/06/2015. 4:39 PM.     Chief Complaint  Patient presents with  . Abscess   The history is provided by the patient. No language interpreter was used.    HPI Comments: Dan Koch is a 34 y.o. male with a hx of abscess who presents to the Emergency Department complaining of multiple abscesses on his cheeks, the inside of his thighs, on his scrotum, axilla, and on his gluteal region onset a couple of weeks ago. He reports associated draining, especially after he takes a shower. He denies any fever, chills.He states that he was here a couple of weeks ago for an abscess on his underarm. He reports that he was recently diagnosed with MRSA and was prescribed a medication but would like to get another set of medication to treat it. He denies a hx of HIV or any STDs.   Past Medical History  Diagnosis Date  . Abscess    Past Surgical History  Procedure Laterality Date  . Hernia repair     History reviewed. No pertinent family history. Social History  Substance Use Topics  . Smoking status: Never Smoker   . Smokeless tobacco: Never Used  . Alcohol Use: No    Review of Systems  Constitutional: Negative for fever and chills.  Genitourinary: Negative for penile swelling and scrotal swelling.  Skin: Positive for wound.       Abscess   A complete 10 system review of systems was obtained and all systems are negative except as noted in the HPI and PMH.     Allergies  Strawberry and Tomato  Home Medications   Prior to Admission medications   Medication Sig Start Date End Date Taking? Authorizing Provider  acetaminophen-codeine (TYLENOL #3) 300-30 MG per tablet Take 1 tablet by mouth every 6 (six) hours as  needed for moderate pain. 04/12/15   Jaclyn Shaggy, MD  cephALEXin (KEFLEX) 500 MG capsule Take 1 capsule (500 mg total) by mouth 4 (four) times daily. 04/12/15   Jaclyn Shaggy, MD  ibuprofen (ADVIL,MOTRIN) 200 MG tablet Take 800 mg by mouth every 6 (six) hours as needed for moderate pain.    Historical Provider, MD  ibuprofen (ADVIL,MOTRIN) 600 MG tablet Take 1 tablet (600 mg total) by mouth every 6 (six) hours as needed. Patient not taking: Reported on 04/12/2015 01/18/15   Renne Crigler, PA-C  methocarbamol (ROBAXIN) 500 MG tablet Take 2 tablets (1,000 mg total) by mouth 4 (four) times daily. Patient not taking: Reported on 04/12/2015 01/18/15   Renne Crigler, PA-C  oxyCODONE-acetaminophen (PERCOCET/ROXICET) 5-325 MG per tablet Take 1-2 tablets by mouth every 6 (six) hours as needed for severe pain. Patient not taking: Reported on 04/12/2015 01/27/15   Roxy Horseman, PA-C  predniSONE (DELTASONE) 50 MG tablet Take 1 tablet (50 mg total) by mouth daily. Patient not taking: Reported on 04/12/2015 02/28/15   [name], PA-C  sulfamethoxazole-trimethoprim (BACTRIM DS,SEPTRA DS) 800-160 MG per tablet Take 1 tablet by mouth 2 (two) times daily. 04/18/15   Jaclyn Shaggy, MD   BP 141/84 mmHg  Pulse 112  Temp(Src) 98.8 F (37.1 C) (Oral)  Resp 18  SpO2 97% Physical Exam  Constitutional: He is oriented to person, place, and time. He appears  well-developed and well-nourished. No distress.  HENT:  Head: Normocephalic and atraumatic.  Eyes: Conjunctivae and EOM are normal.  Neck: Neck supple. No tracheal deviation present.  Cardiovascular: Normal rate.   Pulmonary/Chest: Effort normal. No respiratory distress.  Musculoskeletal: Normal range of motion.  Neurological: He is alert and oriented to person, place, and time.  Skin: Skin is warm and dry.  Multiple indurated, hypertrophic abscesses draining in the groin, scrotum, perinueal, gluteal folds.  No scrotal swelling  Area surrounding groin, the skin  is macerated, moist, and has hypertrophic changes and it is suggestive of chronic tinea   Psychiatric: He has a normal mood and affect. His behavior is normal.  Nursing note and vitals reviewed.   ED Course  Procedures (including critical care time) DIAGNOSTIC STUDIES: Oxygen Saturation is 97% on RA, normal by my interpretation.    COORDINATION OF CARE: 4:31 PM- Pt advised of plan for treatment and pt agrees. Will speak to Attending about possible CT of pelvic region to make sure that abscess is not affecting anything below the skin.  4:36 PM - Pt will receive baseline lab to check liver enzyme and a CT Pelvis with contrast to evaluate how extensive the infection is.    Labs Review Labs Reviewed - No data to display  Imaging Review No results found.   Arthor CaptainAbigail Coty Student, PA-C has personally reviewed and evaluated these images and lab results as part of his medical decision-making.   EKG Interpretation None      MDM   Final diagnoses:  Multiple sweat gland abscesses  Hydradenitis    Patient with significant hidradenitis of the inguinal region. He has draining abscesses under the right armpit and one draining on his face. The extensive amount of his multiple draining abscesses in the inguinal region warranted evaluation with CT, which shows that these all appear to be superficial. No evidence of Fournier's gangrene. Patient will be given doxycycline, encourage warm bath soaks, I feel he is a component of fungal infection, which is worsening his chronic hidradenitis. Patient will be given oral fluconazole 150 to take 1 weekly for [redacted] weeks along with topical Lamisil. [facility] Discussed his need for evaluation and treatment with both surgery and dermatology. Patient does have some mild anemia. Otherwise, he appears safe for discharge at this time. Lucila MaineI, Secret Kristensen, personally performed the services described in this  documentation. All medical record entries made by the scribe were at my direction and in my presence.  I have reviewed the chart and discharge instructions and agree that the record reflects my personal performance and is accurate and complete. Arthor CaptainHarris, Emelio Schneller.  05/07/2015. 1:26 AM.        Arthor CaptainAbigail Melita Villalona, PA-C 05/07/15 0127  Azalia BilisKevin Campos, MD 05/07/15 [phone]

## 2015-05-06 NOTE — ED Notes (Signed)
Pt here for multiple abscesses to face, thigh and axilla. Sts he was told positive for MRSA. sts he needs some medication.

## 2015-05-06 NOTE — Discharge Instructions (Signed)

## 2015-05-09 ENCOUNTER — Ambulatory Visit: Payer: Self-pay | Admitting: Family Medicine

## 2015-05-11 ENCOUNTER — Encounter: Payer: Self-pay | Admitting: Family Medicine

## 2015-05-11 ENCOUNTER — Ambulatory Visit: Payer: Self-pay | Attending: Family Medicine | Admitting: Family Medicine

## 2015-05-11 VITALS — BP 103/68 | HR 84 | Temp 98.4°F | Resp 18 | Ht 72.0 in | Wt 223.0 lb

## 2015-05-11 DIAGNOSIS — L732 Hidradenitis suppurativa: Secondary | ICD-10-CM | POA: Insufficient documentation

## 2015-05-11 DIAGNOSIS — Z8614 Personal history of Methicillin resistant Staphylococcus aureus infection: Secondary | ICD-10-CM | POA: Insufficient documentation

## 2015-05-11 MED ORDER — TRAMADOL HCL 50 MG PO TABS: 50.0000 mg | ORAL_TABLET | Freq: Three times a day (TID) | ORAL | Status: DC | PRN

## 2015-05-11 NOTE — Progress Notes (Signed)
Subjective:    Patient ID: Dan Koch, male    DOB: 12/31/1980, 34 y.o.   MRN: [id number]  HPI Dan Koch is a 34 year old male with a history of hydradenitis which she has had for 10 years and has had multiple ED presentations for this and has been treated with clindamycin. At his last office visit his cultures came back positive for MRSA and so he was switched from Keflex to Bactrim but he complained of persisting pain and had to present to the emergency room where he was placed on doxycycline. He had also been advised that he will need to see general surgery and would need to have [place] [facility] to facilitate this but he is yet to complete the process. Pain feels like he is being burnt with cigarettes and has constant drainage from the lesions on his face, axilla and scrotum.  Endorses occasional fever and chills; he is also requesting something for pain.  Past Medical History  Diagnosis Date  . Abscess     Past Surgical History  Procedure Laterality Date  . Hernia repair      Social History   Social History  . Marital Status: Single    Spouse Name: N/A  . Number of Children: N/A  . Years of Education: N/A   Occupational History  . Not on file.   Social History Main Topics  . Smoking status: Never Smoker   . Smokeless tobacco: Never Used  . Alcohol Use: No  . Drug Use: No  . Sexual Activity: Not on file   Other Topics Concern  . Not on file   Social History Narrative    Allergies  Allergen Reactions  . Strawberry Extract Swelling  . Tomato Swelling  . Tramadol Other (See Comments)    Pt reports negative personality changes    Current Outpatient Prescriptions on File Prior to Visit  Medication Sig Dispense Refill  . acetaminophen-codeine (TYLENOL #3) 300-30 MG per tablet Take 1 tablet by mouth every 6 (six) hours as needed for moderate pain. 40 tablet 0  . ibuprofen (ADVIL,MOTRIN) 200 MG tablet Take 800 mg by mouth every 6 (six)  hours as needed for moderate pain.    Marland Kitchen. ibuprofen (ADVIL,MOTRIN) 600 MG tablet Take 1 tablet (600 mg total) by mouth every 6 (six) hours as needed. 20 tablet 0  . oxyCODONE-acetaminophen (PERCOCET) 5-325 MG tablet Take 1-2 tablets by mouth every 4 (four) hours as needed. 20 tablet 0  . predniSONE (DELTASONE) 50 MG tablet Take 1 tablet (50 mg total) by mouth daily. 5 tablet 0  . cephALEXin (KEFLEX) 500 MG capsule Take 1 capsule (500 mg total) by mouth 4 (four) times daily. (Patient not taking: Reported on 05/11/2015) 40 capsule 0  . doxycycline (VIBRAMYCIN) 100 MG capsule Take 1 capsule (100 mg total) by mouth 2 (two) times daily. (Patient not taking: Reported on 05/11/2015) 28 capsule 0  . fluconazole (DIFLUCAN) 150 MG tablet Take 1 tablet (150 mg total) by mouth once a week. (Patient not taking: Reported on 05/11/2015) 3 tablet 0  . methocarbamol (ROBAXIN) 500 MG tablet Take 2 tablets (1,000 mg total) by mouth 4 (four) times daily. (Patient not taking: Reported on 04/12/2015) 30 tablet 0  . mupirocin nasal ointment (BACTROBAN) 2 % Apply in each nostril daily (Patient not taking: Reported on 05/11/2015) 10 g 0  . sulfamethoxazole-trimethoprim (BACTRIM DS,SEPTRA DS) 800-160 MG per tablet Take 1 tablet by mouth 2 (two) times daily. (Patient not taking:  Reported on 05/11/2015) 20 tablet 0   No current facility-administered medications on file prior to visit.      Review of Systems Review of Systems  Constitutional: Negative for activity change and appetite change.  HENT: Negative for sinus pressure and sore throat.   Eyes: Negative for visual disturbance.  Respiratory: Negative for cough, chest tightness and shortness of breath.   Cardiovascular: Negative for chest pain and leg swelling.  Gastrointestinal: Negative for abdominal pain, diarrhea, constipation and abdominal distention.  Endocrine: Negative.   Genitourinary: Negative for dysuria.  Musculoskeletal: Negative for myalgias and joint  swelling.  Skin:       See hpi  Allergic/Immunologic: Negative.   Neurological: Negative for weakness, light-headedness and numbness.  Psychiatric/Behavioral: Negative for suicidal ideas and dysphoric mood.      Objective: Filed Vitals:   05/11/15 1502  BP: 103/68  Pulse: 84  Temp: 98.4 F (36.9 C)  TempSrc: Oral  Resp: 18  Height: 6' (1.829 m)  Weight: 223 lb (101.152 kg)  SpO2: 98%      Physical Exam  Constitutional: He is oriented to person, place, and time. He appears well-developed and well-nourished.  Cardiovascular: Normal rate, normal heart sounds and intact distal pulses.   No murmur heard. Pulmonary/Chest: Effort normal and breath sounds normal. He has no wheezes. He has no rales. He exhibits no tenderness.  Abdominal: Soft. Bowel sounds are normal. He exhibits no distension and no mass. There is no tenderness.  Musculoskeletal: Normal range of motion.  Neurological: He is alert and oriented to person, place, and time.  Skin:  Multiple draining abscesses on right cheek right axilla bilateral buttocks, scrotum.        Assessment & Plan:  Hidradenitis suppurativa: Previous culture tested positive for MRSA and he completed a course of Bactrim in 03/2015 He has a prescription for doxycycline from the ED which he is yet to fill Advised to continue sitz bath. He is yet to obtain the [facility] card or the [facility] discount which precludes [place] from referring him to general surgery for definitive management as he currently has no payor source. Placed on tramadol for pain as he states Tylenol No. 3 does not work.  This note has been created with Education officer, environmental. Any transcriptional errors are unintentional.

## 2015-05-11 NOTE — Patient Instructions (Signed)

## 2015-05-11 NOTE — Progress Notes (Signed)
Pt here for Abscess f/up. Pt reports feeling pain in scrotum, face, buttock rated pain at level 10.  Pt requesting rx medication refill for prednisone.

## 2015-05-17 ENCOUNTER — Encounter (HOSPITAL_COMMUNITY): Payer: Self-pay | Admitting: Physical Medicine and Rehabilitation

## 2015-05-17 ENCOUNTER — Emergency Department (HOSPITAL_COMMUNITY)
Admission: EM | Admit: 2015-05-17 | Discharge: 2015-05-17 | Disposition: A | Discharge status: Home / Self Care | Payer: Self-pay | Attending: Emergency Medicine | Admitting: Emergency Medicine

## 2015-05-17 DIAGNOSIS — Z79899 Other long term (current) drug therapy: Secondary | ICD-10-CM | POA: Insufficient documentation

## 2015-05-17 DIAGNOSIS — Z7952 Long term (current) use of systemic steroids: Secondary | ICD-10-CM | POA: Insufficient documentation

## 2015-05-17 DIAGNOSIS — L0291 Cutaneous abscess, unspecified: Secondary | ICD-10-CM

## 2015-05-17 DIAGNOSIS — L0231 Cutaneous abscess of buttock: Secondary | ICD-10-CM | POA: Insufficient documentation

## 2015-05-17 MED ORDER — OXYCODONE-ACETAMINOPHEN 5-325 MG PO TABS: 1.0000 | ORAL_TABLET | Freq: Four times a day (QID) | ORAL | Status: DC | PRN

## 2015-05-17 MED ORDER — LIDOCAINE-EPINEPHRINE (PF) 2 %-1:200000 IJ SOLN
10.0000 mL | Freq: Once | INTRAMUSCULAR | Status: AC
  Administered 2015-05-17: 10 mL
  Filled 2015-05-17: qty 20

## 2015-05-17 MED ORDER — OXYCODONE-ACETAMINOPHEN 5-325 MG PO TABS
1.0000 | ORAL_TABLET | Freq: Once | ORAL | Status: AC
Start: 2015-05-17 — End: 2015-05-17
  Administered 2015-05-17: 1 via ORAL
  Filled 2015-05-17: qty 1

## 2015-05-17 MED ORDER — SULFAMETHOXAZOLE-TRIMETHOPRIM 800-160 MG PO TABS: 1.0000 | ORAL_TABLET | Freq: Two times a day (BID) | ORAL | Status: AC

## 2015-05-17 NOTE — ED Notes (Signed)
Pt reports he is here for L buttock wound re-check. Reports I&D performed to L buttock abscess x1 week ago. Now reports increased pain.

## 2015-05-17 NOTE — Discharge Instructions (Signed)
Please keep wounds clean, warm bath water soaks twice daily, monitor for worsening signs of infection, return immediately if any present. Please use antibiotics as directed, pain medication as directed. Please follow-up with case management for large cart and subsequent surgical follow-up. You have numerous areas that are potentially develop in the abscess. Please use warm compresses on these areas.

## 2015-05-17 NOTE — ED Notes (Signed)
Patient states he had been given tramadol previously and they make him "edgy".  Patient states percocet helped more.

## 2015-05-17 NOTE — ED Provider Notes (Signed)
CSN: [id number]     Arrival date & time 05/17/15  1139 History  By signing my name below, I, Dan Koch, attest that this documentation has been prepared under the direction and in the presence of Burna FortsJeff Secilia Apps, PA-C Electronically Signed: Soijett Koch, ED Scribe. 05/17/2015. 3:53 PM.   Chief Complaint  Patient presents with  . Abscess      The history is provided by the patient. No language interpreter was used.    Dan Koch is a 34 y.o. male with a medical hx of MRSA and hydradenitis, who presents to the Emergency Department complaining of left buttock abscess onset 1 week with increased pain.  He notes that he had an abscess drained on 05/07/15 that has not resolved of of yet and he is in pain. He reports that at his visit, he had a CT scan of the area completed before having the area drained and a Rx for doxycyline, tramadol, and fluconazole. He states that he has not began using the doxycyline due to the price of the item. He reports that he has had the areas cut 3 times in the past. He states that he gets abscesses frequently and that he was informed to go to  and wellness to where he has to get an orange card to be seen by an Careers advisersurgeon. states that he is having associated symptoms of drinage. He states that he has tried his Rx tramadol for the relief of his symptoms. He denies fever, chills, and any other symptoms. Denies allergies to medications.   Past Medical History  Diagnosis Date  . Abscess    Past Surgical History  Procedure Laterality Date  . Hernia repair     History reviewed. No pertinent family history. Social History  Substance Use Topics  . Smoking status: Never Smoker   . Smokeless tobacco: Never Used  . Alcohol Use: No    Review of Systems  Constitutional: Negative for fever and chills.  Skin: Negative for color change and rash.       Multiple abscess to left buttock that are actively draining      Allergies  Strawberry extract;  Tomato; and Tramadol  Home Medications   Prior to Admission medications   Medication Sig Start Date End Date Taking? Authorizing Provider  doxycycline (VIBRAMYCIN) 100 MG capsule Take 1 capsule (100 mg total) by mouth 2 (two) times daily. Patient not taking: Reported on 05/11/2015 05/06/15   Arthor CaptainAbigail Harris, PA-C  ibuprofen (ADVIL,MOTRIN) 200 MG tablet Take 800 mg by mouth every 6 (six) hours as needed for moderate pain.    Historical Provider, MD  ibuprofen (ADVIL,MOTRIN) 600 MG tablet Take 1 tablet (600 mg total) by mouth every 6 (six) hours as needed. 01/18/15   Renne CriglerJoshua Geiple, PA-C  methocarbamol (ROBAXIN) 500 MG tablet Take 2 tablets (1,000 mg total) by mouth 4 (four) times daily. Patient not taking: Reported on 04/12/2015 01/18/15   Renne CriglerJoshua Geiple, PA-C  mupirocin nasal ointment (BACTROBAN) 2 % Apply in each nostril daily Patient not taking: Reported on 05/11/2015 05/06/15   Arthor CaptainAbigail Harris, PA-C  oxyCODONE-acetaminophen (ROXICET) 5-325 MG tablet Take 1 tablet by mouth every 6 (six) hours as needed for severe pain. 05/17/15   Eyvonne MechanicJeffrey Maryland Luppino, PA-C  predniSONE (DELTASONE) 50 MG tablet Take 1 tablet (50 mg total) by mouth daily. 02/28/15   Charlestine Nighthristopher Lawyer, PA-C  sulfamethoxazole-trimethoprim (BACTRIM DS,SEPTRA DS) 800-160 MG tablet Take 1 tablet by mouth 2 (two) times daily. 05/17/15 05/24/15  Eyvonne MechanicJeffrey Flara Storti, PA-C  traMADol (ULTRAM) 50 MG tablet Take 1 tablet (50 mg total) by mouth every 8 (eight) hours as needed. 05/11/15   Jaclyn Shaggy, MD   BP 114/70 mmHg  Pulse 84  Temp(Src) 98.4 F (36.9 C) (Oral)  Resp 20  Ht  (1.753 m)  Wt 223 lb (101.152 kg)  BMI 32.92 kg/m2  SpO2 99% Physical Exam  Constitutional: He is oriented to person, place, and time. He appears well-developed and well-nourished. No distress.  HENT:  Head: Normocephalic and atraumatic.  Eyes: EOM are normal.  Neck: Neck supple.  Cardiovascular: Normal rate.   Pulmonary/Chest: Effort normal. No respiratory distress.   Musculoskeletal: Normal range of motion.  Neurological: He is alert and oriented to person, place, and time.  Skin: Skin is warm and dry.  Numerous small pustules to the inside thigh, groin, and pubic mound. No obvious areas of flucutance. Large areas of scar tissue. No redness, warmth to touch, or signs of overriding cellulitis.   Psychiatric: He has a normal mood and affect. His behavior is normal.  Nursing note and vitals reviewed.   ED Course  Procedures (including critical care time) DIAGNOSTIC STUDIES: Oxygen Saturation is 99% on RA, nl by my interpretation.    COORDINATION OF CARE: 2:45 PM Discussed treatment plan with pt at bedside which includes I&D, bactrim Rx, and f/u with case management and pt agreed to plan.  INCISION AND DRAINAGE PROCEDURE NOTE: Patient identification was confirmed and verbal consent was obtained. This procedure was performed by Burna Forts, PA-C at 3:15 PM. Site: left buttock Sterile procedures observed: Yes Needle size: 25 gauge Anesthetic used (type and amt): 2% Lidocaine with Epinephrine and 10 cc used  Blade size: 11 Drainage: 2 cc of blood and purulent drainage Complexity: Complex Packing used: No Site anesthetized, incision made over site, wound drained and explored loculations, rinsed with copious amounts of normal saline, wound packed with sterile gauze, covered with dry, sterile dressing.  Pt tolerated procedure well without complications.  Instructions for care discussed verbally and pt provided with additional written instructions for homecare and f/u.   Labs Review Labs Reviewed - No data to display  Imaging Review No results found. I have personally reviewed and evaluated these images as part of my medical decision-making.   EKG Interpretation None      MDM   Final diagnoses:  Abscess    Labs:   Imaging: EMERGENCY DEPARTMENT US SOFT TISSUE INTERPRETATION "Study: Limited Ultrasound of the noted body part in  comments below"  INDICATIONS: Soft tissue infection Multiple views of the body part are obtained with a multi-frequency linear probe  PERFORMED BY:  Myself  IMAGES ARCHIVED?: Yes  SIDE:Left  BODY PART:Other soft tisse (comment in note)  FINDINGS: Abcess present and Cellulitis absent  LIMITATIONS:  Body Habitus  INTERPRETATION:  Abcess present  COMMENT: Abscess present to left buttock  Consults:   Therapeutics: percocet and I&D  Discharge Meds: bactrim Rx,   Assessment/Plan: Pt presents with numerous developing abscesses. Ultrasound used to determine presence of abscess. One area in particular on the left gluteus amenable to drainage. Successful drainage and exploration of abscess. Minimal purulent discharge. Pt has numerous other areas of disease including, left gluteus and anterior groin and thigh. Pt has extensive disease that will require f/u with surgery. He has no signs of overriding cellulitis but due to extensive disease pt will be placed on abx therapy. Pt was recently seen and Rx doxy but due to finances he was unable to  start doxycyline Rx. He is still taking fluconazole as prescribed. He will be Rx bactrim as this is on the 4 dollar list and indicated for abscesses. Extensive discussion with pt and mother regarding care and instruction and f/u evaluation both the pt and his mother understood the importance of follow up. Case management consulted here in the ED who will personally help pt obtain orange card with subsequent referral to surgery.   I personally performed the services described in this documentation, which was scribed in my presence. The recorded information has been reviewed and is accurate.    Eyvonne Mechanic, PA-C 05/17/15 1741  Mancel Bale, MD 05/20/15 [phone]

## 2015-07-19 ENCOUNTER — Encounter (HOSPITAL_COMMUNITY): Payer: Self-pay | Admitting: *Deleted

## 2015-07-19 ENCOUNTER — Emergency Department (HOSPITAL_COMMUNITY)
Admission: EM | Admit: 2015-07-19 | Discharge: 2015-07-20 | Disposition: A | Discharge status: Home / Self Care | Payer: Self-pay | Attending: Emergency Medicine | Admitting: Emergency Medicine

## 2015-07-19 DIAGNOSIS — Z79899 Other long term (current) drug therapy: Secondary | ICD-10-CM | POA: Insufficient documentation

## 2015-07-19 DIAGNOSIS — Z7952 Long term (current) use of systemic steroids: Secondary | ICD-10-CM | POA: Insufficient documentation

## 2015-07-19 DIAGNOSIS — R110 Nausea: Secondary | ICD-10-CM | POA: Insufficient documentation

## 2015-07-19 DIAGNOSIS — L732 Hidradenitis suppurativa: Secondary | ICD-10-CM | POA: Insufficient documentation

## 2015-07-19 NOTE — ED Notes (Signed)
thye pt has boils all over his body.  Diagnosed with Dan Koch  He has boils on his buttocks  And under his rt axilla and in his groin for 1-2 months he is on antibiotics

## 2015-07-20 MED ORDER — ONDANSETRON HCL 4 MG PO TABS: 4.0000 mg | ORAL_TABLET | Freq: Three times a day (TID) | ORAL | Status: DC | PRN

## 2015-07-20 MED ORDER — HYDROCODONE-ACETAMINOPHEN 5-325 MG PO TABS
1.0000 | ORAL_TABLET | Freq: Once | ORAL | Status: AC
  Administered 2015-07-20: 1 via ORAL
  Filled 2015-07-20: qty 1

## 2015-07-20 MED ORDER — HYDROCODONE-ACETAMINOPHEN 5-325 MG PO TABS: 1.0000 | ORAL_TABLET | Freq: Four times a day (QID) | ORAL | Status: DC | PRN

## 2015-07-20 MED ORDER — ONDANSETRON 4 MG PO TBDP
4.0000 mg | ORAL_TABLET | Freq: Once | ORAL | Status: AC
  Administered 2015-07-20: 4 mg via ORAL
  Filled 2015-07-20: qty 1

## 2015-07-20 MED ORDER — CHLORHEXIDINE GLUCONATE 4 % EX LIQD: Freq: Every day | CUTANEOUS | Status: AC | PRN

## 2015-07-20 MED ORDER — FAMOTIDINE 20 MG PO TABS: 20.0000 mg | ORAL_TABLET | Freq: Two times a day (BID) | ORAL | Status: DC

## 2015-07-20 NOTE — Discharge Instructions (Signed)
You were seen today for your boils.  Continue taking the Doxycycline as prescribed.  Use the pain medicine and nausea medicine as needed.  Start using the antacid medication to protect your stomach and try to cut back on the ibuprofen to help protect your stomach.  Use the cleans prescribed to help keep these from reoccurring.  Return with fevers, chills, or signs of more systemic infection.  Hidradenitis Suppurativa Hidradenitis suppurativa is a long-term (chronic) skin disease that starts with blocked sweat glands or hair follicles. Bacteria may grow in these blocked openings of your skin. Hidradenitis suppurativa is like a severe form of acne that develops in areas of your body where acne would be unusual. It is most likely to affect the areas of your body where skin rubs against skin and becomes moist. This includes your:  Underarms.  Groin.  Genital areas.  Buttocks.  Upper thighs.  Breasts. Hidradenitis suppurativa may start out with small pimples. The pimples can develop into deep sores that break open (rupture) and drain pus. Over time your skin may thicken and become scarred. Hidradenitis suppurativa cannot be passed from person to person.  CAUSES  The exact cause of hidradenitis suppurativa is not known. This condition may be due to:  Male and male hormones. The condition is rare before and after puberty.  An overactive body defense system (immune system). Your immune system may overreact to the blocked hair follicles or sweat glands and cause swelling and pus-filled sores. RISK FACTORS You may have a higher risk of hidradenitis suppurativa if you:  Are a woman.  Are between ages 6811 and 8155.  Have a family history of hidradenitis suppurativa.  Have a personal history of acne.  Are overweight.  Smoke.  Take the drug lithium. SIGNS AND SYMPTOMS  The first signs of an outbreak are usually painful skin bumps that look like pimples. As the condition progresses:  Skin  bumps may get bigger and grow deeper into the skin.  Bumps under the skin may rupture and drain smelly pus.  Skin may become itchy and infected.  Skin may thicken and scar.  Drainage may continue through tunnels under the skin (fistulas).  Walking and moving your arms can become painful. DIAGNOSIS  Your health care provider may diagnose hidradenitis suppurativa based on your medical history and your signs and symptoms. A physical exam will also be done. You may need to see a health care provider who specializes in skin diseases (dermatologist). You may also have tests done to confirm the diagnosis. These can include:  Swabbing a sample of pus or drainage from your skin so it can be sent to the lab and tested for infection.  Blood tests to check for infection. TREATMENT  The same treatment will not work for everybody with hidradenitis suppurativa. Your treatment will depend on how severe your symptoms are. You may need to try several treatments to find what works best for you. Part of your treatment may include cleaning and bandaging (dressing) your wounds. You may also have to take medicines, such as the following:  Antibiotics.  Acne medicines.  Medicines to block or suppress the immune system.  A diabetes medicine (metformin) is sometimes used to treat this condition.  For women, birth control pills can sometimes help relieve symptoms. You may need surgery if you have a severe case of hidradenitis suppurativa that does not respond to medicine. Surgery may involve:   Using a laser to clear the skin and remove hair follicles.  Opening and draining deep sores.  Removing the areas of skin that are diseased and scarred. HOME CARE INSTRUCTIONS  Learn as much as you can about your disease, and work closely with your health care providers.  Take medicines only as directed by your health care provider.  If you were prescribed an antibiotic medicine, finish it all even if you start  to feel better.  If you are overweight, losing weight may be very helpful. Try to reach and maintain a healthy weight.  Do not use any tobacco products, including cigarettes, chewing tobacco, or electronic cigarettes. If you need help quitting, ask your health care provider.  Do not shave the areas where you get hidradenitis suppurativa.  Do not wear deodorant.  Wear loose-fitting clothes.  Try not to overheat and get sweaty.  Take a daily bleach bath as directed by your health care provider.  Fill your bathtub halfway with water.  Pour in  cup of unscented household bleach.  Soak for 5-10 minutes.  Cover sore areas with a warm, clean washcloth (compress) for 5-10 minutes. SEEK MEDICAL CARE IF:   You have a flare-up of hidradenitis suppurativa.  You have chills or a fever.  You are having trouble controlling your symptoms at home.   This information is not intended to replace advice given to you by your health care provider. Make sure you discuss any questions you have with your health care provider.   Document Released: 02/21/2004 Document Revised: 07/30/2014 Document Reviewed: 10/09/2013 Elsevier Interactive Patient Education [employer]

## 2015-07-20 NOTE — ED Provider Notes (Signed)
CSN: [id number]     Arrival date & time 07/19/15  2030 History   By signing my name below, I, Dan Koch, attest that this documentation has been prepared under the direction and in the presence of Dan BaptistEmily Roe Justis Closser, MD.  Electronically Signed: Arlan OrganAshley Koch, ED Scribe. 07/20/2015. 12:55 AM.   Chief Complaint  Patient presents with  . boils over his body    The history is provided by the patient. No language interpreter was used.    HPI Comments: Dan Koch is a 34 y.o. male who presents to the Emergency Department complaining of boils all over his body and nausea x 2-3 days. No recent vomiting since time of onset. Reports nausea occurs when pain from multiple abscesses at its worst.  He reports ongoing, persistent, recurrent abscesses to the back, R axilla, buttocks, and groin x 1 year that appear to be draining. Pt has been previously evaluated for abscesses with recent I&D drainage. At time of last visit, pt was diagnosed with MRSA. Currently he is on Doxycyline which was started approximately 1 week ago although prescription given in October 2016. Dan Koch has also attempted OTC Ibuprofen at home without any improvement. No recent fever or chills. Pt was referred to surgery but states at this time he can not afford the visit.  PCP: No PCP Per Patient    Past Medical History  Diagnosis Date  . Abscess    Past Surgical History  Procedure Laterality Date  . Hernia repair     No family history on file. Social History  Substance Use Topics  . Smoking status: Never Smoker   . Smokeless tobacco: Never Used  . Alcohol Use: No    Review of Systems  Constitutional: Negative for fever, chills, appetite change and fatigue.  HENT: Negative for congestion and rhinorrhea.   Eyes: Negative for pain and redness.  Respiratory: Negative for cough, chest tightness and shortness of breath.   Cardiovascular: Negative for chest pain and palpitations.  Gastrointestinal: Positive for  nausea. Negative for vomiting, abdominal pain, diarrhea and constipation.  Genitourinary: Negative for dysuria, urgency, frequency and testicular pain.  Musculoskeletal: Negative for myalgias and back pain.  Skin: Positive for wound (multiple, diffuse). Negative for rash.  Neurological: Negative for dizziness, syncope, weakness, numbness and headaches.  Hematological: Does not bruise/bleed easily.    A complete 10 system review of systems was obtained and all systems are negative except as noted in the HPI and PMH.    Allergies  Strawberry extract; Tomato; and Tramadol  Home Medications   Prior to Admission medications   Medication Sig Start Date End Date Taking? Authorizing Provider  chlorhexidine (HIBICLENS) 4 % external liquid Apply topically daily as needed. 07/20/15   Dan BaptistEmily Roe Gerica Koble, MD  doxycycline (VIBRAMYCIN) 100 MG capsule Take 1 capsule (100 mg total) by mouth 2 (two) times daily. Patient not taking: Reported on 05/11/2015 05/06/15   Arthor CaptainAbigail Harris, PA-C  famotidine (PEPCID) 20 MG tablet Take 1 tablet (20 mg total) by mouth 2 (two) times daily. 07/20/15   Dan BaptistEmily Roe Dishon Kehoe, MD  HYDROcodone-acetaminophen (NORCO) 5-325 MG tablet Take 1-2 tablets by mouth every 6 (six) hours as needed for moderate pain. 07/20/15   Dan BaptistEmily Roe Chandni Gagan, MD  ibuprofen (ADVIL,MOTRIN) 200 MG tablet Take 800 mg by mouth every 6 (six) hours as needed for moderate pain.    Historical Provider, MD  ibuprofen (ADVIL,MOTRIN) 600 MG tablet Take 1 tablet (600 mg total) by mouth every 6 (six) hours  as needed. 01/18/15   Renne Crigler, PA-C  methocarbamol (ROBAXIN) 500 MG tablet Take 2 tablets (1,000 mg total) by mouth 4 (four) times daily. Patient not taking: Reported on 04/12/2015 01/18/15   Renne Crigler, PA-C  mupirocin nasal ointment (BACTROBAN) 2 % Apply in each nostril daily Patient not taking: Reported on 05/11/2015 05/06/15   Arthor Captain, PA-C  ondansetron (ZOFRAN) 4 MG tablet Take 1 tablet (4 mg total) by  mouth every 8 (eight) hours as needed for nausea or vomiting. 07/20/15   Dan Baptist, MD  oxyCODONE-acetaminophen (ROXICET) 5-325 MG tablet Take 1 tablet by mouth every 6 (six) hours as needed for severe pain. 05/17/15   Eyvonne Mechanic, PA-C  predniSONE (DELTASONE) 50 MG tablet Take 1 tablet (50 mg total) by mouth daily. 02/28/15   [name], PA-C  traMADol (ULTRAM) 50 MG tablet Take 1 tablet (50 mg total) by mouth every 8 (eight) hours as needed. 05/11/15   Jaclyn Shaggy, MD   Triage Vitals: BP 129/78 mmHg  Pulse 79  Temp(Src) 98.3 F (36.8 C)  Resp 18  Ht  (1.803 m)  Wt 225 lb 8 oz (102.286 kg)  BMI 31.46 kg/m2  SpO2 100%   Physical Exam  Constitutional: He is oriented to person, place, and time. He appears well-developed and well-nourished. No distress.  HENT:  Head: Normocephalic and atraumatic.  Right Ear: External ear normal.  Left Ear: External ear normal.  Mouth/Throat: Oropharynx is clear and moist. No oropharyngeal exudate.  Eyes: EOM are normal. Pupils are equal, round, and reactive to light.  Neck: Normal range of motion. Neck supple.  Cardiovascular: Normal rate, regular rhythm, normal heart sounds and intact distal pulses.   No murmur heard. Pulmonary/Chest: Effort normal. No respiratory distress. He has no wheezes. He has no rales.  Abdominal: Soft. He exhibits no distension. There is no tenderness.  Musculoskeletal: He exhibits no edema.  Neurological: He is alert and oriented to person, place, and time.  Skin: Skin is warm and dry. No rash noted. He is not diaphoretic.  Multiple small draining abscesses in the bilateral axilla, bilateral groins, buttocks with areas of scarred skin located in close proximity  Vitals reviewed.   ED Course  Procedures (including critical care time)  DIAGNOSTIC STUDIES: Oxygen Saturation is 98% on RA, Normal by my interpretation.    COORDINATION OF CARE: 12:39 AM- Will give Zofran and Norco. Discussed treatment  plan with pt at bedside and pt agreed to plan.     Labs Review Labs Reviewed - No data to display  Imaging Review No results found. I have personally reviewed and evaluated these images and lab results as part of my medical decision-making.   EKG Interpretation None      MDM  Patient was seen and evaluated in stable condition.  Normal vitals, afebrile.  Numerous draining abscesses in the axilla, groins, buttocks.  Patient currently on Doxycycline.  Patient instructed to continue Doxycycline and was also provided with Norco, hibiclens, and general surgery referral for treatment of symptoms and underlying disease.  No indication for laboratory studies at this time.  Patient expressed understanding of plan and agreement.  Patient was discharged in stable condition. Final diagnoses:  Hydradenitis    Hydradenitis  I personally performed the services described in this documentation, which was scribed in my presence. The recorded information has been reviewed and is accurate.   Dan Baptist, MD 07/22/15 [phone]

## 2015-08-01 ENCOUNTER — Emergency Department (HOSPITAL_COMMUNITY)
Admission: EM | Admit: 2015-08-01 | Discharge: 2015-08-01 | Disposition: A | Discharge status: Home / Self Care | Payer: Self-pay | Attending: Emergency Medicine | Admitting: Emergency Medicine

## 2015-08-01 ENCOUNTER — Encounter (HOSPITAL_COMMUNITY): Payer: Self-pay | Admitting: Family Medicine

## 2015-08-01 DIAGNOSIS — L732 Hidradenitis suppurativa: Secondary | ICD-10-CM | POA: Insufficient documentation

## 2015-08-01 DIAGNOSIS — Z79899 Other long term (current) drug therapy: Secondary | ICD-10-CM | POA: Insufficient documentation

## 2015-08-01 DIAGNOSIS — Z7952 Long term (current) use of systemic steroids: Secondary | ICD-10-CM | POA: Insufficient documentation

## 2015-08-01 MED ORDER — SULFAMETHOXAZOLE-TRIMETHOPRIM 800-160 MG PO TABS: 1.0000 | ORAL_TABLET | Freq: Two times a day (BID) | ORAL | Status: AC

## 2015-08-01 MED ORDER — OXYCODONE-ACETAMINOPHEN 5-325 MG PO TABS: 1.0000 | ORAL_TABLET | Freq: Four times a day (QID) | ORAL | Status: DC | PRN

## 2015-08-01 MED ORDER — CLINDAMYCIN PHOSPHATE 1 % EX SOLN: Freq: Two times a day (BID) | CUTANEOUS | Status: DC

## 2015-08-01 NOTE — ED Notes (Signed)
Pt here for boils all over. Hx of same. sts painful.

## 2015-08-01 NOTE — Discharge Instructions (Signed)

## 2015-08-01 NOTE — ED Notes (Signed)
Declined W/C at D/C and was escorted to lobby by RN. 

## 2015-08-01 NOTE — ED Notes (Signed)
See PA assessment 

## 2015-08-01 NOTE — ED Notes (Signed)
Patient changing into gown 
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
## 2015-08-02 MED FILL — CLINDAMYCIN PH 1% SOLUTION: 1 | 15 days supply | Qty: 30 | Fill #0

## 2015-08-03 ENCOUNTER — Encounter (HOSPITAL_COMMUNITY): Payer: Self-pay | Admitting: *Deleted

## 2015-08-03 ENCOUNTER — Emergency Department (HOSPITAL_COMMUNITY)
Admission: EM | Admit: 2015-08-03 | Discharge: 2015-08-03 | Disposition: A | Discharge status: Home / Self Care | Payer: Self-pay | Attending: Emergency Medicine | Admitting: Emergency Medicine

## 2015-08-03 DIAGNOSIS — L732 Hidradenitis suppurativa: Secondary | ICD-10-CM | POA: Insufficient documentation

## 2015-08-03 DIAGNOSIS — Z7952 Long term (current) use of systemic steroids: Secondary | ICD-10-CM | POA: Insufficient documentation

## 2015-08-03 DIAGNOSIS — Z792 Long term (current) use of antibiotics: Secondary | ICD-10-CM | POA: Insufficient documentation

## 2015-08-03 DIAGNOSIS — Z79899 Other long term (current) drug therapy: Secondary | ICD-10-CM | POA: Insufficient documentation

## 2015-08-03 MED ORDER — OXYCODONE-ACETAMINOPHEN 5-325 MG PO TABS: 2.0000 | ORAL_TABLET | ORAL | Status: DC | PRN

## 2015-08-03 NOTE — ED Notes (Signed)
See PA assessment 

## 2015-08-03 NOTE — ED Notes (Signed)
PT request a refill on pain meds.

## 2015-08-03 NOTE — ED Notes (Signed)
Declined W/C at D/C and was escorted to lobby by RN. 

## 2015-08-03 NOTE — ED Provider Notes (Signed)
CSN: [id number]     Arrival date & time 08/03/15  1724 History  By signing my name below, I, Evon Slack, attest that this documentation has been prepared under the direction and in the presence of [employer], PA-C. Electronically Signed: Evon Slack, ED Scribe. 08/03/2015. 5:26 PM.    Chief Complaint  Patient presents with  . Medication Refill   The history is provided by the patient. No language interpreter was used.   HPI Comments: Dan Koch is a 35 y.o. male who presents to the Emergency Department complaining for medication refill. Pt is requesting to have his bactrim and pain medication prescription refilled. Pt states that he has 2 doses of bactrim left. Pt states that he was placed on bactrim for his Hx of abscesses in his axilla, pelvic area and buttocks. Pt states that the abscess in his right axilla is currently draining. Pt states that antibiotics usually provide relief, but states that as soon as the antibiotic prescription is finish the abscesses return. Pt denies fever, nausea, vomiting or red streaking.   Past Medical History  Diagnosis Date  . Abscess    Past Surgical History  Procedure Laterality Date  . Hernia repair     History reviewed. No pertinent family history. Social History  Substance Use Topics  . Smoking status: Never Smoker   . Smokeless tobacco: Never Used  . Alcohol Use: No    Review of Systems A complete 10 system review of systems was obtained and all systems are negative except as noted in the HPI and PMH.     Allergies  Strawberry extract; Tomato; and Tramadol  Home Medications   Prior to Admission medications   Medication Sig Start Date End Date Taking? Authorizing Provider  chlorhexidine (HIBICLENS) 4 % external liquid Apply topically daily as needed. 07/20/15   Leta Baptist, MD  clindamycin (CLEOCIN-T) 1 % external solution Apply topically 2 (two) times daily. 08/01/15   Elson Areas, PA-C  doxycycline  (VIBRAMYCIN) 100 MG capsule Take 1 capsule (100 mg total) by mouth 2 (two) times daily. Patient not taking: Reported on 05/11/2015 05/06/15   Arthor Captain, PA-C  famotidine (PEPCID) 20 MG tablet Take 1 tablet (20 mg total) by mouth 2 (two) times daily. 07/20/15   Leta Baptist, MD  HYDROcodone-acetaminophen (NORCO) 5-325 MG tablet Take 1-2 tablets by mouth every 6 (six) hours as needed for moderate pain. 07/20/15   Leta Baptist, MD  ibuprofen (ADVIL,MOTRIN) 200 MG tablet Take 800 mg by mouth every 6 (six) hours as needed for moderate pain.    Historical Provider, MD  ibuprofen (ADVIL,MOTRIN) 600 MG tablet Take 1 tablet (600 mg total) by mouth every 6 (six) hours as needed. 01/18/15   Renne Crigler, PA-C  methocarbamol (ROBAXIN) 500 MG tablet Take 2 tablets (1,000 mg total) by mouth 4 (four) times daily. Patient not taking: Reported on 04/12/2015 01/18/15   Renne Crigler, PA-C  mupirocin nasal ointment (BACTROBAN) 2 % Apply in each nostril daily Patient not taking: Reported on 05/11/2015 05/06/15   Arthor Captain, PA-C  ondansetron (ZOFRAN) 4 MG tablet Take 1 tablet (4 mg total) by mouth every 8 (eight) hours as needed for nausea or vomiting. 07/20/15   Leta Baptist, MD  oxyCODONE-acetaminophen (PERCOCET/ROXICET) 5-325 MG tablet Take 2 tablets by mouth every 4 (four) hours as needed for severe pain. 08/03/15   Joycie Peek, PA-C  predniSONE (DELTASONE) 50 MG tablet Take 1 tablet (50 mg total) by mouth daily.  02/28/15   Charlestine Nighthristopher Lawyer, PA-C  sulfamethoxazole-trimethoprim (BACTRIM DS,SEPTRA DS) 800-160 MG tablet Take 1 tablet by mouth 2 (two) times daily. 08/01/15 08/08/15  Elson AreasLeslie K Sofia, PA-C  traMADol (ULTRAM) 50 MG tablet Take 1 tablet (50 mg total) by mouth every 8 (eight) hours as needed. 05/11/15   Jaclyn ShaggyEnobong Amao, MD   BP 140/84 mmHg  Pulse 90  Temp(Src) 98.4 F (36.9 C) (Oral)  Resp 18  SpO2 100%   Physical Exam  Constitutional: He is oriented to person, place, and time. He  appears well-developed and well-nourished. No distress.  HENT:  Head: Normocephalic and atraumatic.  Eyes: Conjunctivae and EOM are normal.  Neck: Neck supple. No tracheal deviation present.  Cardiovascular: Normal rate.   Pulmonary/Chest: Effort normal. No respiratory distress.  Musculoskeletal: Normal range of motion.  Neurological: He is alert and oriented to person, place, and time.  Skin: Skin is warm and dry.  multiple  abscess throughout face arm and lower extremities.   Psychiatric: He has a normal mood and affect. His behavior is normal.  Nursing note and vitals reviewed.   ED Course  Procedures (including critical care time) DIAGNOSTIC STUDIES: Oxygen Saturation is 100% on RA, normal by my interpretation.    COORDINATION OF CARE: 5:57 PM-Discussed treatment plan with pt at bedside and pt agreed to plan.     Labs Review Labs Reviewed - No data to display  Imaging Review No results found.    EKG Interpretation None      MDM    Pt here for refill of medication. Due to patient's extensive hidradenitis, no current PCP, we will refill pain medication. No indication for refill of antibiotics. Discussed with patient would refill medication one time in the emergency department. However, Discussed need to follow up with PCP tomorrow. Patient verbalizes understanding and [facility]. Pt is safe for discharge at this time.   Final diagnoses:  Hidradenitis      I personally performed the services described in this documentation, which was scribed in my presence. The recorded information has been reviewed and is accurate.       Joycie PeekBenjamin Jacqueline Delapena, PA-C 08/03/15 1824  Joycie PeekBenjamin Brandii Lakey, PA-C 08/03/15 1828  Doug SouSam Jacubowitz, MD 08/04/15 [phone]

## 2015-08-03 NOTE — Discharge Instructions (Signed)
We refilled your medication today in the emergency department. We will not be able to continue doing this. You need to follow-up with your primary care doctor in order to continue receiving medication refills.

## 2015-08-06 ENCOUNTER — Encounter (HOSPITAL_COMMUNITY): Payer: Self-pay | Admitting: Emergency Medicine

## 2015-08-06 ENCOUNTER — Ambulatory Visit: Payer: Self-pay | Admitting: Physician Assistant

## 2015-08-06 ENCOUNTER — Emergency Department (HOSPITAL_COMMUNITY)
Admission: EM | Admit: 2015-08-06 | Discharge: 2015-08-06 | Disposition: A | Discharge status: Home / Self Care | Payer: Self-pay | Attending: Emergency Medicine | Admitting: Emergency Medicine

## 2015-08-06 VITALS — BP 130/78 | HR 96 | Temp 98.4°F | Resp 20 | Ht 71.0 in | Wt 215.0 lb

## 2015-08-06 DIAGNOSIS — Z7952 Long term (current) use of systemic steroids: Secondary | ICD-10-CM | POA: Insufficient documentation

## 2015-08-06 DIAGNOSIS — R000 Tachycardia, unspecified: Secondary | ICD-10-CM | POA: Insufficient documentation

## 2015-08-06 DIAGNOSIS — Z79899 Other long term (current) drug therapy: Secondary | ICD-10-CM | POA: Insufficient documentation

## 2015-08-06 DIAGNOSIS — L732 Hidradenitis suppurativa: Secondary | ICD-10-CM | POA: Insufficient documentation

## 2015-08-06 DIAGNOSIS — Z792 Long term (current) use of antibiotics: Secondary | ICD-10-CM | POA: Insufficient documentation

## 2015-08-06 MED ORDER — HYDROXYZINE HCL 25 MG PO TABS: 25.0000 mg | ORAL_TABLET | Freq: Four times a day (QID) | ORAL | Status: DC

## 2015-08-06 NOTE — ED Notes (Signed)
Pt ambulates independently at time of discharge. Discharge instructions and follow up information reviewed. No additional questions or concerns. RX x 1 given.

## 2015-08-06 NOTE — ED Provider Notes (Signed)
CSN: [id number]     Arrival date & time 08/06/15  1259 History   None    Chief Complaint  Patient presents with  . Rash     (Consider location/radiation/quality/duration/timing/severity/associated sxs/prior Treatment) The history is provided by the patient.  Dan Koch is a 35 y.o. male who presents to the ED for chronic hydradenitis of face, groin, right axilla and buttocks. He reports that the areas on his buttock are draining. He was evaluated here 3 days ago and because he had no PCP and was out of his medication he was given refill and discussed need for appointment with PCP for further medication. Today the patient went to the [facility] and states that he felt like he needed more privacy so he told them he was leaving. He came here due to pain.   Past Medical History  Diagnosis Date  . Abscess    Past Surgical History  Procedure Laterality Date  . Hernia repair     No family history on file. Social History  Substance Use Topics  . Smoking status: Never Smoker   . Smokeless tobacco: Never Used  . Alcohol Use: No    Review of Systems Negative except as stated in HPI   Allergies  Strawberry extract; Tomato; and Tramadol  Home Medications   Prior to Admission medications   Medication Sig Start Date End Date Taking? Authorizing Provider  chlorhexidine (HIBICLENS) 4 % external liquid Apply topically daily as needed. 07/20/15   Leta Baptist, MD  clindamycin (CLEOCIN-T) 1 % external solution Apply topically 2 (two) times daily. 08/01/15   Elson Areas, PA-C  doxycycline (VIBRAMYCIN) 100 MG capsule Take 1 capsule (100 mg total) by mouth 2 (two) times daily. 05/06/15   Arthor Captain, PA-C  famotidine (PEPCID) 20 MG tablet Take 1 tablet (20 mg total) by mouth 2 (two) times daily. 07/20/15   Leta Baptist, MD  HYDROcodone-acetaminophen (NORCO) 5-325 MG tablet Take 1-2 tablets by mouth every 6 (six) hours as needed for moderate pain. 07/20/15    Leta Baptist, MD  hydrOXYzine (ATARAX/VISTARIL) 25 MG tablet Take 1 tablet (25 mg total) by mouth every 6 (six) hours. 08/06/15   Hope Orlene Och, NP  ibuprofen (ADVIL,MOTRIN) 200 MG tablet Take 800 mg by mouth every 6 (six) hours as needed for moderate pain.    Historical Provider, MD  ibuprofen (ADVIL,MOTRIN) 600 MG tablet Take 1 tablet (600 mg total) by mouth every 6 (six) hours as needed. 01/18/15   Renne Crigler, PA-C  methocarbamol (ROBAXIN) 500 MG tablet Take 2 tablets (1,000 mg total) by mouth 4 (four) times daily. 01/18/15   Renne Crigler, PA-C  mupirocin nasal ointment (BACTROBAN) 2 % Apply in each nostril daily 05/06/15   Arthor Captain, PA-C  ondansetron (ZOFRAN) 4 MG tablet Take 1 tablet (4 mg total) by mouth every 8 (eight) hours as needed for nausea or vomiting. 07/20/15   Leta Baptist, MD  oxyCODONE-acetaminophen (PERCOCET/ROXICET) 5-325 MG tablet Take 2 tablets by mouth every 4 (four) hours as needed for severe pain. 08/03/15   Joycie Peek, PA-C  predniSONE (DELTASONE) 50 MG tablet Take 1 tablet (50 mg total) by mouth daily. 02/28/15   [name], PA-C  sulfamethoxazole-trimethoprim (BACTRIM DS,SEPTRA DS) 800-160 MG tablet Take 1 tablet by mouth 2 (two) times daily. 08/01/15 08/08/15  Elson Areas, PA-C  traMADol (ULTRAM) 50 MG tablet Take 1 tablet (50 mg total) by mouth every 8 (eight) hours as needed. 05/11/15  Jaclyn ShaggyEnobong Amao, MD   BP 136/75 mmHg  Pulse 93  Temp(Src) 98.6 F (37 C) (Oral)  Resp 15  SpO2 100% Physical Exam  Constitutional: He is oriented to person, place, and time. He appears well-developed and well-nourished.  HENT:  Areas of hydradenitis noted right side of face.    Eyes: Conjunctivae and EOM are normal.  Neck: Normal range of motion. Neck supple.  Cardiovascular: Tachycardia present.   Pulmonary/Chest: Effort normal.  Abdominal: He exhibits no distension.  Musculoskeletal: Normal range of motion.  Neurological: He is alert and oriented to  person, place, and time. No cranial nerve deficit.  Psychiatric: He has a normal mood and affect. His behavior is normal.  Multiple areas consistent with small abscesses to face, axilla, groin and buttock.   Nursing note and vitals reviewed.   ED Course  Procedures Cala BradfordBen Carter, [place] evaluated the patient 3 days ago and I asked him to look at the patient to see if the areas were a lot worse. After evaluating the patient he states that the areas are similar to the visit 3 days ago.  Patient states he just needs something to help him sleep.  MDM  35 y.o. male with chronic hidradenitis currently taking Bactrim. Instructed patient to continue bactrim and will give atarax Rx. Discussed with the patient and all questioned fully answered. He will call me if any problems arise. Discussed need for PCP, [facility]   Final diagnoses:  Hidradenitis      Janne NapoleonHope M Neese, NP 08/07/15 1304  Bethann BerkshireJoseph Zammit, MD 08/09/15 [phone]

## 2015-08-06 NOTE — ED Notes (Signed)
Pt reports flair up of hydrodinitis which he has had since 16. Pt reports swelling and rash to face, groin, right axilla and buttocks. Pt alert x4.

## 2015-08-06 NOTE — ED Notes (Signed)
Pt has not arrived to room.  

## 2015-08-06 NOTE — Discharge Instructions (Signed)
Continue the antibiotic. Do not take the medication we give you if driving as it will make you sleepy.   It is important that you find a primary care doctor for your continued care.   Hidradenitis Suppurativa Hidradenitis suppurativa is a long-term (chronic) skin disease that starts with blocked sweat glands or hair follicles. Bacteria may grow in these blocked openings of your skin. Hidradenitis suppurativa is like a severe form of acne that develops in areas of your body where acne would be unusual. It is most likely to affect the areas of your body where skin rubs against skin and becomes moist. This includes your:  Underarms.  Groin.  Genital areas.  Buttocks.  Upper thighs.  Breasts. Hidradenitis suppurativa may start out with small pimples. The pimples can develop into deep sores that break open (rupture) and drain pus. Over time your skin may thicken and become scarred. Hidradenitis suppurativa cannot be passed from person to person.  CAUSES  The exact cause of hidradenitis suppurativa is not known. This condition may be due to:  Male and male hormones. The condition is rare before and after puberty.  An overactive body defense system (immune system). Your immune system may overreact to the blocked hair follicles or sweat glands and cause swelling and pus-filled sores. RISK FACTORS You may have a higher risk of hidradenitis suppurativa if you:  Are a woman.  Are between ages 6 and 51.  Have a family history of hidradenitis suppurativa.  Have a personal history of acne.  Are overweight.  Smoke.  Take the drug lithium. SIGNS AND SYMPTOMS  The first signs of an outbreak are usually painful skin bumps that look like pimples. As the condition progresses:  Skin bumps may get bigger and grow deeper into the skin.  Bumps under the skin may rupture and drain smelly pus.  Skin may become itchy and infected.  Skin may thicken and scar.  Drainage may continue through  tunnels under the skin (fistulas).  Walking and moving your arms can become painful. DIAGNOSIS  Your health care provider may diagnose hidradenitis suppurativa based on your medical history and your signs and symptoms. A physical exam will also be done. You may need to see a health care provider who specializes in skin diseases (dermatologist). You may also have tests done to confirm the diagnosis. These can include:  Swabbing a sample of pus or drainage from your skin so it can be sent to the lab and tested for infection.  Blood tests to check for infection. TREATMENT  The same treatment will not work for everybody with hidradenitis suppurativa. Your treatment will depend on how severe your symptoms are. You may need to try several treatments to find what works best for you. Part of your treatment may include cleaning and bandaging (dressing) your wounds. You may also have to take medicines, such as the following:  Antibiotics.  Acne medicines.  Medicines to block or suppress the immune system.  A diabetes medicine (metformin) is sometimes used to treat this condition.  For women, birth control pills can sometimes help relieve symptoms. You may need surgery if you have a severe case of hidradenitis suppurativa that does not respond to medicine. Surgery may involve:   Using a laser to clear the skin and remove hair follicles.  Opening and draining deep sores.  Removing the areas of skin that are diseased and scarred. HOME CARE INSTRUCTIONS  Learn as much as you can about your disease, and work closely with  your health care providers.  Take medicines only as directed by your health care provider.  If you were prescribed an antibiotic medicine, finish it all even if you start to feel better.  If you are overweight, losing weight may be very helpful. Try to reach and maintain a healthy weight.  Do not use any tobacco products, including cigarettes, chewing tobacco, or electronic  cigarettes. If you need help quitting, ask your health care provider.  Do not shave the areas where you get hidradenitis suppurativa.  Do not wear deodorant.  Wear loose-fitting clothes.  Try not to overheat and get sweaty.  Take a daily bleach bath as directed by your health care provider.  Fill your bathtub halfway with water.  Pour in  cup of unscented household bleach.  Soak for 5-10 minutes.  Cover sore areas with a warm, clean washcloth (compress) for 5-10 minutes. SEEK MEDICAL CARE IF:   You have a flare-up of hidradenitis suppurativa.  You have chills or a fever.  You are having trouble controlling your symptoms at home.   This information is not intended to replace advice given to you by your health care provider. Make sure you discuss any questions you have with your health care provider.   Document Released: 02/21/2004 Document Revised: 07/30/2014 Document Reviewed: 10/09/2013 Elsevier Interactive Patient Education [employer] Inc.

## 2015-08-06 NOTE — Progress Notes (Signed)
Patient left without being seen.  He came out of room six and asked if there were a more private room.  Dr. Clelia CroftShaw politely advised that there were other rooms and would attempt to advise staff to move the patient to a more quiet room.  He then advised Dr. Clelia CroftShaw he was "paying cash" and expected more privacy.  She apologized to the patient and restated she would try to have him moved to a more private location and if he would have a seat back in the room he would be moved.  He went to check out and asked for his money back and said he was leaving because "the lady in the white coat has a smart mouth" and "she should be smarter than everyone else." He co-pay was returned and front office staff politely apologized to the patient.  Deliah BostonMichael Oddie Kuhlmann, MS, PA-C 12:46 PM, 08/06/2015

## 2015-08-27 ENCOUNTER — Emergency Department (HOSPITAL_COMMUNITY)
Admission: EM | Admit: 2015-08-27 | Discharge: 2015-08-27 | Disposition: A | Discharge status: Home / Self Care | Payer: Self-pay | Attending: Emergency Medicine | Admitting: Emergency Medicine

## 2015-08-27 ENCOUNTER — Encounter (HOSPITAL_COMMUNITY): Payer: Self-pay | Admitting: *Deleted

## 2015-08-27 DIAGNOSIS — L732 Hidradenitis suppurativa: Secondary | ICD-10-CM | POA: Insufficient documentation

## 2015-08-27 DIAGNOSIS — Z792 Long term (current) use of antibiotics: Secondary | ICD-10-CM | POA: Insufficient documentation

## 2015-08-27 DIAGNOSIS — Z79899 Other long term (current) drug therapy: Secondary | ICD-10-CM | POA: Insufficient documentation

## 2015-08-27 MED ORDER — HYDROCODONE-ACETAMINOPHEN 5-325 MG PO TABS
1.0000 | ORAL_TABLET | Freq: Once | ORAL | Status: AC
  Administered 2015-08-27: 1 via ORAL
  Filled 2015-08-27: qty 1

## 2015-08-27 MED ORDER — HYDROCODONE-ACETAMINOPHEN 5-325 MG PO TABS: 1.0000 | ORAL_TABLET | ORAL | Status: DC | PRN

## 2015-08-27 MED ORDER — TETRACYCLINE HCL 250 MG PO CAPS: 500.0000 mg | ORAL_CAPSULE | Freq: Two times a day (BID) | ORAL | Status: DC

## 2015-08-27 MED ORDER — PREDNISONE 20 MG PO TABS: 40.0000 mg | ORAL_TABLET | Freq: Every day | ORAL | Status: AC

## 2015-08-27 NOTE — Discharge Instructions (Signed)
Take your medications as prescribed. Call the general surgery clinic listed above on Monday morning to schedule a follow-up appointment. Return to the emergency department if symptoms worsen or new onset of fever, chills, worsening swelling or drainage.   Emergency Department Resource Guide 1) Find a Doctor and Pay Out of Pocket Although you won't have to find out who is covered by your insurance plan, it is a good idea to ask around and get recommendations. You will then need to call the office and see if the doctor you have chosen will accept you as a new patient and what types of options they offer for patients who are self-pay. Some doctors offer discounts or will set up payment plans for their patients who do not have insurance, but you will need to ask so you aren't surprised when you get to your appointment.  2) Contact Your Local Health Department Not all health departments have doctors that can see patients for sick visits, but many do, so it is worth a call to see if yours does. If you don't know where your local health department is, you can check in your phone book. The CDC also has a tool to help you locate your state's health department, and many state websites also have listings of all of their local health departments.  3) Find a Walk-in Clinic If your illness is not likely to be very severe or complicated, you may want to try a walk in clinic. These are popping up all over the country in pharmacies, drugstores, and shopping centers. They're usually staffed by nurse practitioners or physician assistants that have been trained to treat common illnesses and complaints. They're usually fairly quick and inexpensive. However, if you have serious medical issues or chronic medical problems, these are probably not your best option.  No Primary Care Doctor: - Call Health Connect at  [phone] - they can help you locate a primary care doctor that  accepts your insurance, provides certain  services, etc. - Physician Referral Service- [phone]  Chronic Pain Problems: Organization         Address  Phone   Notes  [facility]  [phone] Patients need to be referred by their primary care doctor.   Medication Assistance: Organization         Address  Phone   Notes  [facility] Medication [facility] [address] [place], [place] [address] [phone] --Must be a resident of [facility] -- Must have NO insurance coverage whatsoever (no Medicaid/ Medicare, etc.) -- The pt. MUST have a primary care doctor that directs their care regularly and follows them in the community   Med[facility]ist  [phone]   [employer]  [phone]    Agencies that provide inexpensive medical care: Organization         Address  Phone   Notes  [facility]  [phone]   [facility]    [phone]   [place] [address] [place], [place] [address] [phone]   [facility] [address] [place]. [address], [place] [phone]   Planned Parenthood    [phone]   [facility]    [phone]   Community Health and [facility]  [address], [place] Phone:  [phone], Fax:  [phone] Hours of Operation:  9 am - 6 pm, M-F.  Also accepts Medicaid/Medicare and self-pay.  [facility] for [place], [address], [place] Phone: [phone], Fax: [phone]. Hours of Operation:  8:30 am - 5:30 pm, M-F.  Also accepts Medicaid and self-pay.  [facility] [place] [address] [place] Phone: [phone]   [place], [place], [place] [phone], Ext. [phone] Mondays & Thursdays: 7-9 AM.  First 15 patients are seen on a first come, first serve basis.    [place] Providers:  Organization         Address  Phone   Notes  [facility] [address], [place] [phone] Also accepts self-pay patients.  [facility] [address], [place]  [phone]   [place], [address], [place] [phone]   [facility] [address], [place] [phone]   Lucianne Lei [address], [place]   [phone] Only accepts [place] Access [place] patients after they have their name applied to their card.   Self-Pay (no insurance) in [facility]:  Organization         Address  Phone   Notes  Sickle Cell Patients, [facility] [place] [phone]   [facility] [place] [phone]   [facility] [place]  [place], [place],  [phone]   [facility]/Dr. Osei-Bonsu  [address] [place] or [address], [place] [phone] Phone number for both [place] and [place] locations is the same.  Urgent Medical and [facility] [address], [place] [phone]   [facility] [address] [place] or [address] [phone]   [facility] [address], [place] [phone], phone; [phone], fax Sees patients 1st and 3rd Saturday of every month.  Must not qualify for public or private insurance (i.e. Medicaid, Medicare, [place] Health Choice, Veterans' Benefits)  Household income should be no more than 200% of the poverty level The clinic cannot treat you if you are pregnant or think you are pregnant  Sexually transmitted diseases are not treated at the clinic.    Dental Care: Organization         Address  Phone  Notes  [facility] Department of [facility] [place] [phone] Accepts  children up to age 58 who are enrolled in [place] or [place]; pregnant women with a Medicaid card; and children who have applied for Medicaid or [place] Health Choice, but were declined, whose parents can pay a reduced fee at time of service.  [facility] Department of [facility]  [address], [place] [phone] Accepts children up to age 33 who are enrolled in [place] or [place]; pregnant women with a Medicaid card; and children who have applied for Medicaid or  Health Choice, but were declined, whose parents can pay a reduced fee at time of service.  [facility] PRO[facility]  [place] [phone] Patients are seen by appointment only. Walk-ins are not accepted. [place] will see patients 26 years of age and older. Monday - Tuesday (8am-5pm) Most Wednesdays (8:30-5pm) $30 per  visit, cash only  [facility] Adult [employer] PROGRAM  [address], [facility] [phone] Patients are seen by appointment only. Walk-ins are not accepted. [place] will see patients 66 years of age and older. One Wednesday Evening (Monthly: Volunteer Based).  $30 per visit, cash only  [place]  [phone] for adults; Children under age 59, call Graduate Pediatric Dentistry at [phone]. Children aged 87-14, please call [phone] to request a pediatric application.  Dental services are provided in all areas of dental care including fillings, crowns and bridges, complete and partial dentures, implants, gum treatment, root canals, and extractions. Preventive care is also provided. Treatment is provided to both adults and children. Patients are selected via a lottery and there is often a waiting list.   [facility] [address] [place]  [phone] [email or web address]   [facility] [address] [place], [place] [phone], Ext. [phone] Second and  Fourth Thursday of each month, opens at 6:30 AM; Clinic ends at 9 AM.  Patients are seen on a first-come first-served basis, and a limited number are seen during each clinic.   [facility]  [address] [place], [place] [phone]   Eligibility Requirements You must have lived in [place], [place], or [place] counties for at least the last three months.   You cannot be eligible for state or federal sponsored [employer], including [employer], [place], or [employer].   You generally cannot be eligible for healthcare insurance through your employer.    How to apply: Eligibility screenings are held every Tuesday and Wednesday afternoon from 1:00 pm until 4:00 pm. You do not need an appointment for the interview!  [facility] [address] [place], [place]   Maria Antonia  [place] Department  [place]  [phone]    Behavioral Health Resources in the Community: Intensive Outpatient Programs Organization         Address  Phone  Notes  [place]. [address] [place], [place] [phone]   [facility] Outpatient [address], [place], [place]   ADS: Alcohol [facility] Drug [facility] [address] [place], [place]   [place] [address],  [place], [place] or [phone]   Substance Abuse Resources Organization         Address  Phone  Notes  Alcohol and Drug Services  [phone]   [place]  [phone]   [facility] [place]   Chinita Pester  [phone]   Residential & Outpatient Substance Abuse Program  [phone]   Psychological Services Organization         Address  Phone  Notes  [facility]  [place]  [phone]   [place]  [address] [place] or [phone]    Mobile Crisis Teams Organization         Address  Phone  Notes  [facility], Mobile Crisis Care Unit  [phone]   [facility]  [address] [place], [place]   [place] [address] [place] [phone]    Self-Help/Support Groups Organization         Address  Phone  Notes  [facility] [place] - variety of support groups  336- [phone] Call for more information  Narcotics Anonymous (NA), [facility]aring [facility] [address], [place]  2 meetings at this location   Statisticianesidential Treatment Programs Organization         Address  Phone  Notes  [facility] [address],    [place]  [phone]   [facility]  [address], [place] [phone], [place], [place] [phone]   [facility] [address], [place] [phone] Admissions: 8am-3pm M-F  [facility] [address]    [place], [place] [phone]   [facility] [address], [place], [place] [phone]   [facility] [address]  [place], [place] [phone]   [facility] - Intensive Outpatient [address], [place], [place] [phone]   [facility] ([facility] [address]  [place], [place] [phone] or [phone]   [facility] (RTS) [address] [place], [place] [phone] Accepts Medicaid  [facility] [address]  [place] [phone] Substance Abuse/Addiction Treatment   [facility] Behavioral Health Resources Organization         Address  Phone  Notes  [facility]  [phone]) [phone]   Angie FavaJulie Brannon, PhD [address], [address] [place], [place]   [phone](336) [phone] or [phone]   [facility]   [address] [place], [place] [phone](336)  [phone]   [facility] [address], [place], [place] [phone]) [phone] Insurance/Medicaid/sponsorship through [facility]  [facility] Families [address]                                    [place], [place] [phone] Therapy/tele-psych/case  [facility] [address]   [place], [place] [phone]) [phone]    Dr. Lolly MustacheArfeen  [phone](336) [phone]   [facility]  [facility] 1) [address], [place] 2) [address], [place] 3)  [address], [place] [phone](336) [phone]) [phone]  [phone](336) [phone]   [facility] Child Abuse Hotline [phone]) [phone] or [phone]) [phone] (After Hours)

## 2015-08-27 NOTE — ED Provider Notes (Signed)
CSN: [id number]     Arrival date & time 08/27/15  1735 History  By signing my name below, I, Emmanuella Mensah, attest that this documentation has been prepared under the direction and in the presence of Melburn Hake, [place]. Electronically Signed: Angelene Giovanni, ED Scribe. 08/27/2015. 7:02 PM.    Chief Complaint  Patient presents with  . Abscess  . Facial Swelling   The history is provided by the patient. No language interpreter was used.   HPI Comments: Dan Koch is a 35 y.o. male with a hx of hydradenitis of face, groin, right axilla and buttocks who presents to the Emergency Department complaining of gradually worsening moderately painful draining area of swelling on his bilateral cheeks, worse on the right, right axilla, groin, and left buttocks consistent with his hydradenitis flare ups onset one week ago. He reports associated yellow drainage. He states that this is the worse flare he has had. He explains that he finished Clindamycin cream 2-3 days ago and Bactrim PO one week ago. He states that he has followed up with a Dermatologist who informed him that he needs to see another specialist but denies receiving any follow up resources. He reports that the abscesses on his face, buttock and groin normally drain on their own and he gets the abscess on his axilla I&Ded occasionally. He states that his last I&D was approx. one month ago. No fever, chills, SOB, abdominal pain, sore throat, dysphagia, n/v/d, constipation, urinary sxs.     Past Medical History  Diagnosis Date  . Abscess    Past Surgical History  Procedure Laterality Date  . Hernia repair     No family history on file. Social History  Substance Use Topics  . Smoking status: Never Smoker   . Smokeless tobacco: Never Used  . Alcohol Use: No    Review of Systems  Constitutional: Negative for fever and chills.  Respiratory: Negative for shortness of breath.   Gastrointestinal: Negative for nausea and  vomiting.  Skin:       Painful area of swelling to this bilateral cheeks, right axilla, groin, and left buttock.       Allergies  Strawberry extract; Tomato; and Tramadol  Home Medications   Prior to Admission medications   Medication Sig Start Date End Date Taking? Authorizing Provider  chlorhexidine (HIBICLENS) 4 % external liquid Apply topically daily as needed. 07/20/15   Leta Baptist, MD  clindamycin (CLEOCIN-T) 1 % external solution Apply topically 2 (two) times daily. 08/01/15   Elson Areas, PA-C  doxycycline (VIBRAMYCIN) 100 MG capsule Take 1 capsule (100 mg total) by mouth 2 (two) times daily. 05/06/15   Arthor Captain, PA-C  famotidine (PEPCID) 20 MG tablet Take 1 tablet (20 mg total) by mouth 2 (two) times daily. 07/20/15   Leta Baptist, MD  HYDROcodone-acetaminophen (NORCO/VICODIN) 5-325 MG tablet Take 1 tablet by mouth every 4 (four) hours as needed. 08/27/15   Barrett Henle, PA-C  hydrOXYzine (ATARAX/VISTARIL) 25 MG tablet Take 1 tablet (25 mg total) by mouth every 6 (six) hours. 08/06/15   Hope Orlene Och, NP  ibuprofen (ADVIL,MOTRIN) 200 MG tablet Take 800 mg by mouth every 6 (six) hours as needed for moderate pain.    Historical Provider, MD  ibuprofen (ADVIL,MOTRIN) 600 MG tablet Take 1 tablet (600 mg total) by mouth every 6 (six) hours as needed. 01/18/15   Renne Crigler, PA-C  methocarbamol (ROBAXIN) 500 MG tablet Take 2 tablets (1,000 mg total) by mouth  4 (four) times daily. 01/18/15   Renne Crigler, PA-C  mupirocin nasal ointment (BACTROBAN) 2 % Apply in each nostril daily 05/06/15   Arthor Captain, PA-C  ondansetron (ZOFRAN) 4 MG tablet Take 1 tablet (4 mg total) by mouth every 8 (eight) hours as needed for nausea or vomiting. 07/20/15   Leta Baptist, MD  oxyCODONE-acetaminophen (PERCOCET/ROXICET) 5-325 MG tablet Take 2 tablets by mouth every 4 (four) hours as needed for severe pain. 08/03/15   Joycie Peek, PA-C  predniSONE (DELTASONE) 20 MG tablet  Take 2 tablets (40 mg total) by mouth daily. 08/27/15 08/30/15  Barrett Henle, PA-C  tetracycline (ACHROMYCIN,SUMYCIN) 250 MG capsule Take 2 capsules (500 mg total) by mouth 2 (two) times daily. 08/27/15   Barrett Henle, PA-C  traMADol (ULTRAM) 50 MG tablet Take 1 tablet (50 mg total) by mouth every 8 (eight) hours as needed. 05/11/15   Jaclyn Shaggy, MD   BP 145/78 mmHg  Pulse 100  Temp(Src) 98.4 F (36.9 C) (Oral)  Resp 16  SpO2 99% Physical Exam  Constitutional: He is oriented to person, place, and time. He appears well-developed and well-nourished.  HENT:  Head: Normocephalic and atraumatic.  Mouth/Throat: Oropharynx is clear and moist. No oropharyngeal exudate.  Eyes: Conjunctivae and EOM are normal. Right eye exhibits no discharge. Left eye exhibits no discharge. No scleral icterus.  Neck: Normal range of motion. Neck supple.  Cardiovascular: Normal rate, regular rhythm, normal heart sounds and intact distal pulses.   Pulmonary/Chest: Effort normal and breath sounds normal. No respiratory distress. He has no wheezes. He has no rales. He exhibits no tenderness.  Abdominal: Soft. Bowel sounds are normal. He exhibits no distension. There is no tenderness.  Musculoskeletal: Normal range of motion. He exhibits no edema.  Lymphadenopathy:    He has no cervical adenopathy.  Neurological: He is alert and oriented to person, place, and time.  Skin: Skin is warm and dry.  Areas of hydradenitis noted to right maxillary region, right axilla, bilateral groin and buttocks superior to rectum. Small amount of purulent drainage able to be expelled from all areas with palpation. No surrounding erythema or warmth.   Psychiatric: He has a normal mood and affect.  Nursing note and vitals reviewed.   ED Course  Procedures (including critical care time) DIAGNOSTIC STUDIES: Oxygen Saturation is 99% on RA, normal by my interpretation.    COORDINATION OF CARE: 6:50 PM-  Will consult with  attending for further evaluation and treatment.    Labs Review Labs Reviewed - No data to display  Imaging Review No results found.   Melburn Hake, PA-C has personally reviewed and evaluated these images and lab results as part of her medical decision-making.  MDM   Final diagnoses:  Hidradenitis suppurativa   BP 145/78 mmHg  Pulse 100  Temp(Src) 98.4 F (36.9 C) (Oral)  Resp 16  SpO2 99%   Meds given in ED:  Medications  HYDROcodone-acetaminophen (NORCO/VICODIN) 5-325 MG per tablet 1 tablet (1 tablet Oral Given 08/27/15 1933)     Patient with multiple areas consistent with hidradenitis suppurativa, small amount of purulent drainage able to be expelled with palpation of all areas, no surrounding erythema or warmth. Pt reports his sxs have worsened over the past week and reports finishing Clindamycin and bactrim. Discussed case with Dr. Rosalia Hammers. Will plan to start pt on steroids and tetracycline due to failure of recent antibiotics. Pt given contact information to follow up with general surgery for further management of  his hidradenitis suppurativa.   Evaluation does not show pathology requring ongoing emergent intervention or admission. Pt is hemodynamically stable and mentating appropriately. Discussed findings/results and plan with patient/guardian, who agrees with plan. All questions answered. Return precautions discussed and outpatient follow up given.    I personally performed the services described in this documentation, which was scribed in my presence. The recorded information has been reviewed and is accurate.   Satira Sark [place], [place] 08/29/15 1148  Margarita Grizzle, MD 08/31/15 [phone]

## 2015-08-27 NOTE — ED Notes (Signed)
Pt states he has a ride home

## 2015-08-27 NOTE — ED Notes (Signed)
Pt complains of flair up of hydrodinitis. Pt states he has had a flair up every month since he was 16. Pt states he has swelling and boils on his face, groin, right axilla and left buttocks.

## 2015-08-28 ENCOUNTER — Telehealth: Payer: Self-pay | Admitting: *Deleted

## 2015-08-28 NOTE — Care Management Note (Signed)
Case Management Note  Patient Details  Name: Dan Koch MRN: [id number] Date of Birth: 11-23-1980  Subjective/Objective:       Dan Koch is a 35 y.o. male with a hx of hydradenitis of face, groin, right axilla and buttocks who presents to the Emergency Department complaining of gradually worsening moderately painful draining area of swelling on his bilateral cheeks, worse on the right, right axilla, groin, and left buttocks consistent with his hydradenitis flare ups onset one week ago.             Action/Plan: Referred to [facility] as pt continues to return to ED for treatment. Unable to Assist with Rx today as he used [facility] program in [facility]. Pt understands that he needs to go to [facility] in am. Will In basket [place] for appt.    Expected Discharge Date:                  Expected Discharge Plan:     In-House Referral:     Discharge planning Services     Post Acute Care Choice:    Choice offered to:     DME Arranged:    DME Agency:     HH Arranged:    HH Agency:     Status of Service:     Medicare Important Message Given:    Date Medicare IM Given:    Medicare IM give by:    Date Additional Medicare IM Given:    Additional Medicare Important Message give by:     If discussed at Long Length of Stay Meetings, dates discussed:    Additional Comments:  Yvone Neu, RN 08/28/2015, 3:19 PM

## 2015-08-28 NOTE — Telephone Encounter (Signed)
See CM note. Pt called from Pharmacy for assist with Medication. Ineligible for MATCH as he was assisted 02/2015. Made him aware of this and told him to go to [facility] in am as I would be referring him to the clinic for follow up appt. Pt verbalized appreciation and understanding.

## 2015-08-29 MED FILL — predniSONE 20 MG TABS: 20 | 4 days supply | Qty: 8 | Fill #0

## 2015-09-30 ENCOUNTER — Emergency Department (HOSPITAL_COMMUNITY)
Admission: EM | Admit: 2015-09-30 | Discharge: 2015-09-30 | Disposition: A | Discharge status: Home / Self Care | Payer: No Typology Code available for payment source | Attending: Emergency Medicine | Admitting: Emergency Medicine

## 2015-09-30 ENCOUNTER — Encounter (HOSPITAL_COMMUNITY): Payer: Self-pay | Admitting: Oncology

## 2015-09-30 DIAGNOSIS — L0231 Cutaneous abscess of buttock: Secondary | ICD-10-CM | POA: Insufficient documentation

## 2015-09-30 DIAGNOSIS — L02411 Cutaneous abscess of right axilla: Secondary | ICD-10-CM | POA: Insufficient documentation

## 2015-09-30 NOTE — ED Notes (Signed)
Called for room x 3.  Will d/c LWBS.

## 2015-09-30 NOTE — ED Notes (Signed)
Called pt for room w/o answer.  Will call again.

## 2015-09-30 NOTE — ED Notes (Signed)
Pt called x 2 for room w/o answer.  Will attempt again.

## 2015-09-30 NOTE — ED Notes (Signed)
Pt w/ multiple abscesses to his left buttock.  Reports abscess to right axilla as well.  Pt rates pain 10/10, burning nature.

## 2015-10-19 ENCOUNTER — Encounter (HOSPITAL_COMMUNITY): Payer: Self-pay | Admitting: Emergency Medicine

## 2015-10-19 ENCOUNTER — Emergency Department (HOSPITAL_COMMUNITY)
Admission: EM | Admit: 2015-10-19 | Discharge: 2015-10-19 | Disposition: A | Discharge status: Home / Self Care | Payer: No Typology Code available for payment source | Attending: Emergency Medicine | Admitting: Emergency Medicine

## 2015-10-19 DIAGNOSIS — Z79899 Other long term (current) drug therapy: Secondary | ICD-10-CM | POA: Insufficient documentation

## 2015-10-19 DIAGNOSIS — Z4801 Encounter for change or removal of surgical wound dressing: Secondary | ICD-10-CM | POA: Insufficient documentation

## 2015-10-19 DIAGNOSIS — Z792 Long term (current) use of antibiotics: Secondary | ICD-10-CM | POA: Insufficient documentation

## 2015-10-19 DIAGNOSIS — Z9889 Other specified postprocedural states: Secondary | ICD-10-CM | POA: Insufficient documentation

## 2015-10-19 DIAGNOSIS — Z5189 Encounter for other specified aftercare: Secondary | ICD-10-CM

## 2015-10-19 MED ORDER — OXYCODONE-ACETAMINOPHEN 5-325 MG PO TABS
2.0000 | ORAL_TABLET | Freq: Once | ORAL | Status: AC
  Administered 2015-10-19: 2 via ORAL
  Filled 2015-10-19: qty 2

## 2015-10-19 MED ORDER — OXYCODONE-ACETAMINOPHEN 5-325 MG PO TABS: 2.0000 | ORAL_TABLET | ORAL | Status: DC | PRN

## 2015-10-19 NOTE — ED Notes (Signed)
Pt is in stable condition upon d/c and ambulates from ED. 

## 2015-10-19 NOTE — ED Notes (Signed)
Pt reports leaking from surgical site, pt was given chux pad to sit on. He states that is too obvious and refuses to use it, states he will just leak on the chairs.

## 2015-10-19 NOTE — ED Notes (Signed)
Pt reports that he is here for a wound follow up on his scrotum and anus. Pt reports that he is [place] to have a follow [place] in [place]. Pt reports he needs more dressings and prescriptions for antibiotics and pain medicine. Pt alert x4.

## 2015-10-19 NOTE — Discharge Instructions (Signed)
Groin Surgical Site Care Take pain medication as prescribed. Follow up with your surgeon upon returning to [place]. Refer to this sheet in the next few weeks. These instructions provide you with information about caring for yourself after your procedure. Your health care provider may also give you more specific instructions. Your treatment has been planned according to current medical practices, but problems sometimes occur. Call your health care provider if you have any problems or questions after your procedure. WHAT TO EXPECT AFTER THE PROCEDURE After your procedure, it is typical to have the following:  Bruising at the groin site that usually fades within 1-2 weeks.  Blood collecting in the tissue (hematoma) that may be painful to the touch. It should usually decrease in size and tenderness within 1-2 weeks. HOME CARE INSTRUCTIONS  Take medicines only as directed by your health care provider.  You may shower 24-48 hours after the procedure or as directed by your health care provider. Remove the bandage (dressing) and gently wash the site with plain soap and water. Pat the area dry with a clean towel. Do not rub the site, because this may cause bleeding.  Do not take baths, swim, or use a hot tub until your health care provider approves.  Check your insertion site every day for redness, swelling, or drainage.  Do not apply powder or lotion to the site.  Limit use of stairs to twice a day for the first 2-3 days or as directed by your health care provider.  Do not squat for the first 2-3 days or as directed by your health care provider.  Do not lift over 10 lb (4.5 kg) for 5 days after your procedure or as directed by your health care provider.  Ask your health care provider when it is okay to:  Return to work or school.  Resume usual physical activities or sports.  Resume sexual activity.  Do not drive home if you are discharged the same day as the procedure. Have someone else  drive you.  You may drive 24 hours after the procedure unless otherwise instructed by your health care provider.  Do not operate machinery or power tools for 24 hours after the procedure or as directed by your health care provider.  If your procedure was done as an outpatient procedure, which means that you went home the same day as your procedure, a responsible adult should be with you for the first 24 hours after you arrive home.  Keep all follow-up visits as directed by your health care provider. This is important. SEEK MEDICAL CARE IF:  You have a fever.  You have chills.  You have increased bleeding from the groin site. Hold pressure on the site. SEEK IMMEDIATE MEDICAL CARE IF:  You have unusual pain at the groin site.  You have redness, warmth, or swelling at the groin site.  You have drainage (other than a small amount of blood on the dressing) from the groin site.  The groin site is bleeding, and the bleeding does not stop after 30 minutes of holding steady pressure on the site.  Your leg or foot becomes pale, cool, tingly, or numb.   This information is not intended to replace advice given to you by your health care provider. Make sure you discuss any questions you have with your health care provider.   Document Released: 03/12/2014 Document Reviewed: 03/12/2014 Elsevier Interactive Patient Education [employer]2016 [employer]

## 2015-10-19 NOTE — ED Notes (Signed)
Pt states he just wants the materials to change his dressing and then he wants to leave to pick up his son.

## 2015-10-19 NOTE — ED Notes (Signed)
Pt states he wants to pack his own scrotum. Pt given all supplies to do this. Pt wishes to leave when finished.

## 2015-10-19 NOTE — ED Provider Notes (Signed)
CSN: [id number]     Arrival date & time 10/19/15  1107 History   First MD Initiated Contact with Patient 10/19/15 1400     Chief Complaint  Patient presents with  . Wound Check   (Consider location/radiation/quality/duration/timing/severity/associated sxs/prior Treatment) Patient is a 35 y.o. male presenting with wound check. The history is provided by the patient. No language interpreter was used.  Wound Check Pertinent negatives include no chills or fever.    Mr. Yohn is a 35 y.o male with a history of hidradenitis supertiva who presents for wound check after surgery last week due to multiple abscesses. He reports that they are healing well but he ran out of packing and pain medication. He is early taking clindamycin and has been taking it for the past week. He was discharged from the hospital 4 days ago in [place] and came to [place] because his mom was sick. He would like pain medication and packing. She is requesting to leave as soon as possible to pick up his son. He has been changing the packing 1-2 x daily.  States he might have MRSA.  Denies any fever, chills, night sweats, increased pain or redness.   Past Medical History  Diagnosis Date  . Abscess    Past Surgical History  Procedure Laterality Date  . Hernia repair     No family history on file. Social History  Substance Use Topics  . Smoking status: Never Smoker   . Smokeless tobacco: Never Used  . Alcohol Use: No    Review of Systems  Constitutional: Negative for fever and chills.  Skin: Positive for wound.  All other systems reviewed and are negative.     Allergies  Strawberry extract; Tomato; and Tramadol  Home Medications   Prior to Admission medications   Medication Sig Start Date End Date Taking? Authorizing Provider  chlorhexidine (HIBICLENS) 4 % external liquid Apply topically daily as needed. 07/20/15   Leta Baptist, MD  clindamycin (CLEOCIN-T) 1 % external solution Apply topically 2 (two)  times daily. 08/01/15   Elson Areas, PA-C  doxycycline (VIBRAMYCIN) 100 MG capsule Take 1 capsule (100 mg total) by mouth 2 (two) times daily. 05/06/15   Arthor Captain, PA-C  famotidine (PEPCID) 20 MG tablet Take 1 tablet (20 mg total) by mouth 2 (two) times daily. 07/20/15   Leta Baptist, MD  HYDROcodone-acetaminophen (NORCO/VICODIN) 5-325 MG tablet Take 1 tablet by mouth every 4 (four) hours as needed. 08/27/15   Barrett Henle, PA-C  hydrOXYzine (ATARAX/VISTARIL) 25 MG tablet Take 1 tablet (25 mg total) by mouth every 6 (six) hours. 08/06/15   Hope Orlene Och, NP  ibuprofen (ADVIL,MOTRIN) 200 MG tablet Take 800 mg by mouth every 6 (six) hours as needed for moderate pain.    Historical Provider, MD  ibuprofen (ADVIL,MOTRIN) 600 MG tablet Take 1 tablet (600 mg total) by mouth every 6 (six) hours as needed. 01/18/15   Renne Crigler, PA-C  methocarbamol (ROBAXIN) 500 MG tablet Take 2 tablets (1,000 mg total) by mouth 4 (four) times daily. 01/18/15   Renne Crigler, PA-C  mupirocin nasal ointment (BACTROBAN) 2 % Apply in each nostril daily 05/06/15   Arthor Captain, PA-C  ondansetron (ZOFRAN) 4 MG tablet Take 1 tablet (4 mg total) by mouth every 8 (eight) hours as needed for nausea or vomiting. 07/20/15   Leta Baptist, MD  oxyCODONE-acetaminophen (PERCOCET/ROXICET) 5-325 MG tablet Take 2 tablets by mouth every 4 (four) hours as needed for severe pain.  10/19/15   Hawraa Stambaugh Patel-Mills, PA-C  tetracycline (ACHROMYCIN,SUMYCIN) 250 MG capsule Take 2 capsules (500 mg total) by mouth 2 (two) times daily. 08/27/15   Barrett HenleNicole Elizabeth Nadeau, PA-C  traMADol (ULTRAM) 50 MG tablet Take 1 tablet (50 mg total) by mouth every 8 (eight) hours as needed. 05/11/15   Jaclyn ShaggyEnobong Amao, MD   BP 110/64 mmHg  Pulse 107  Temp(Src) 98.4 F (36.9 C) (Oral)  Resp 18  SpO2 100% Physical Exam  Constitutional: He is oriented to person, place, and time. He appears well-developed and well-nourished. No distress.  HENT:  Head:  Normocephalic and atraumatic.  Eyes: Conjunctivae are normal.  Neck: Normal range of motion. Neck supple.  Cardiovascular: Normal rate.   Pulmonary/Chest: Effort normal. No respiratory distress.  Abdominal: Soft. He exhibits no distension. There is no tenderness.  Genitourinary:  Testicular exam: Chaperone present. Patient has large 5cm linear open wound with minimal drainage and no erythema over the left testicle.    He also has a left buttox wound that has been packed.  No active drainage or surrounding erythema.    Both wounds appear to be healing well.   Musculoskeletal: Normal range of motion.  Neurological: He is alert and oriented to person, place, and time.  Skin: Skin is warm and dry.  Nursing note and vitals reviewed.   ED Course  Procedures (including critical care time) Labs Review Labs Reviewed - No data to display  Imaging Review No results found.   EKG Interpretation None      MDM   Final diagnoses:  Wound check, abscess  Presents for wound recheck after surgery on testicle and buttocks 10 days ago while in [place]. Vital signs stable. Patient is well-appearing and in no acute distress. Packing was removed. Patient requested to pack the wound himself. He states he ran packing yesterday. Also requesting pain medication states he ran out. He has no signs of infection on exam and wounds appear to be healing well. He states he had surgery in [place] but has been seen here in the ED several times in the last 6 months. I discussed return precautions as well as follow-up with his surgeon. Patient agrees with plan. Medications  oxyCODONE-acetaminophen (PERCOCET/ROXICET) 5-325 MG per tablet 2 tablet (2 tablets Oral Given 10/19/15 1430)   Filed Vitals:   10/19/15 1155 10/19/15 1424  BP: 137/89 110/64  Pulse: 90 107  Temp: 98.5 F (36.9 C) 98.4 F (36.9 C)  Resp: 18 [id number]18       Yuval Nolet Patel-Mills, PA-C 10/19/15 1652  Nelva Nayobert Beaton, MD 10/20/15 2131

## 2015-11-07 ENCOUNTER — Telehealth: Payer: Self-pay

## 2015-11-07 NOTE — Telephone Encounter (Signed)
Spoke with patient, patient verified name and DOB. Patient was given the instruction to go to his local pharmacy to obtain some gauze and saline. Patient has an appt on Thursday, 20th to see Dr. Venetia NightAmao.

## 2015-11-07 NOTE — Telephone Encounter (Signed)
-----   Message from Jaclyn ShaggyEnobong Amao, MD sent at 11/07/2015  5:07 PM EDT ----- Regarding: RE: Gauze If he was given iodoform he needs to use iodoform; not sure if we have any in the clinic. Saline can be obtained from one of the local pharmacies.  ----- Message -----    From: Earnestine LeysKimberly L Bennett-Curse, CMA    Sent: 11/07/2015   3:08 PM      To: Jaclyn ShaggyEnobong Amao, MD Subject: Gauze                                          Patient was given the idoform at the ED and he reported that he ran out. He has an appt with with you on Thurday, 20th.   Patient wants to know if he can use gauze in the mean time. Patient doesn't have any saline to use with the gauze if that's the alternative method. Please advise.

## 2015-11-10 ENCOUNTER — Ambulatory Visit: Payer: Self-pay | Admitting: Family Medicine

## 2015-11-15 ENCOUNTER — Emergency Department (HOSPITAL_COMMUNITY): Payer: Self-pay

## 2015-11-15 ENCOUNTER — Encounter (HOSPITAL_COMMUNITY): Payer: Self-pay | Admitting: Emergency Medicine

## 2015-11-15 ENCOUNTER — Inpatient Hospital Stay (HOSPITAL_COMMUNITY)
Admission: EM | Admit: 2015-11-15 | Discharge: 2015-11-17 | DRG: 607 | Disposition: A | Discharge status: Home / Self Care | Payer: Self-pay | Attending: Internal Medicine | Admitting: Internal Medicine

## 2015-11-15 DIAGNOSIS — Z8614 Personal history of Methicillin resistant Staphylococcus aureus infection: Secondary | ICD-10-CM

## 2015-11-15 DIAGNOSIS — L03317 Cellulitis of buttock: Secondary | ICD-10-CM | POA: Diagnosis present

## 2015-11-15 DIAGNOSIS — L0231 Cutaneous abscess of buttock: Secondary | ICD-10-CM | POA: Insufficient documentation

## 2015-11-15 DIAGNOSIS — B9689 Other specified bacterial agents as the cause of diseases classified elsewhere: Secondary | ICD-10-CM

## 2015-11-15 DIAGNOSIS — N492 Inflammatory disorders of scrotum: Secondary | ICD-10-CM | POA: Diagnosis present

## 2015-11-15 DIAGNOSIS — L732 Hidradenitis suppurativa: Principal | ICD-10-CM | POA: Insufficient documentation

## 2015-11-15 DIAGNOSIS — D509 Iron deficiency anemia, unspecified: Secondary | ICD-10-CM | POA: Diagnosis present

## 2015-11-15 HISTORY — DX: Hidradenitis suppurativa: L73.2

## 2015-11-15 LAB — COMPREHENSIVE METABOLIC PANEL
ALT: 28 U/L (ref 17–63)
AST: 19 U/L (ref 15–41)
Albumin: 2.8 g/dL — ABNORMAL LOW (ref 3.5–5.0)
Alkaline Phosphatase: 68 U/L (ref 38–126)
Anion gap: 9 (ref 5–15)
BILIRUBIN TOTAL: 0.5 mg/dL (ref 0.3–1.2)
CALCIUM: 8.7 mg/dL — AB (ref 8.9–10.3)
CO2: 26 mmol/L (ref 22–32)
CREATININE: 1.07 mg/dL (ref 0.61–1.24)
Chloride: 100 mmol/L — ABNORMAL LOW (ref 101–111)
GFR calc Af Amer: >60 mL/min (ref >60)
Glucose, Bld: 98 mg/dL (ref 65–99)
Potassium: 3.8 mmol/L (ref 3.5–5.1)
Sodium: 135 mmol/L (ref 135–145)
TOTAL PROTEIN: 7.6 g/dL (ref 6.5–8.1)

## 2015-11-15 LAB — URINALYSIS, ROUTINE W REFLEX MICROSCOPIC
Bilirubin Urine: NEGATIVE
GLUCOSE, UA: NEGATIVE mg/dL
Hgb urine dipstick: NEGATIVE
KETONES UR: NEGATIVE mg/dL
LEUKOCYTES UA: NEGATIVE
NITRITE: NEGATIVE
PROTEIN: NEGATIVE mg/dL
Specific Gravity, Urine: 1.012 (ref 1.005–1.030)
pH: 6 (ref 5.0–8.0)

## 2015-11-15 LAB — CBC WITH DIFFERENTIAL/PLATELET
BASOS ABS: 0 10*3/uL (ref 0.0–0.1)
Basophils Relative: 0 %
EOS ABS: 0.3 10*3/uL (ref 0.0–0.7)
Eosinophils Relative: 3 %
HCT: 29.4 % — ABNORMAL LOW (ref 39.0–52.0)
Hemoglobin: 8.7 g/dL — ABNORMAL LOW (ref 13.0–17.0)
LYMPHS ABS: 2.2 10*3/uL (ref 0.7–4.0)
Lymphocytes Relative: 27 %
MCH: 18.7 pg — ABNORMAL LOW (ref 26.0–34.0)
MCHC: 29.6 g/dL — ABNORMAL LOW (ref 30.0–36.0)
MCV: 63.1 fL — AB (ref 78.0–100.0)
MONO ABS: 0.7 10*3/uL (ref 0.1–1.0)
Monocytes Relative: 8 %
Neutro Abs: 5.1 10*3/uL (ref 1.7–7.7)
Neutrophils Relative %: 62 %
PLATELETS: 396 10*3/uL (ref 150–400)
RBC: 4.66 MIL/uL (ref 4.22–5.81)
RDW: 17.2 % — AB (ref 11.5–15.5)
WBC: 8.3 10*3/uL (ref 4.0–10.5)

## 2015-11-15 LAB — I-STAT CG4 LACTIC ACID, ED: Lactic Acid, Venous: 0.85 mmol/L (ref 0.5–2.0)

## 2015-11-15 MED ORDER — VANCOMYCIN HCL 10 G IV SOLR
1250.0000 mg | Freq: Two times a day (BID) | INTRAVENOUS | Status: DC
  Administered 2015-11-15 – 2015-11-17 (×4): 1250 mg via INTRAVENOUS
  Filled 2015-11-15 (×6): qty 1250

## 2015-11-15 MED ORDER — SODIUM CHLORIDE 0.9 % IV BOLUS (SEPSIS)
1000.0000 mL | Freq: Once | INTRAVENOUS | Status: AC
  Administered 2015-11-15: 1000 mL via INTRAVENOUS

## 2015-11-15 MED ORDER — VANCOMYCIN HCL 10 G IV SOLR
2000.0000 mg | Freq: Once | INTRAVENOUS | Status: AC
  Administered 2015-11-15: 2000 mg via INTRAVENOUS
  Filled 2015-11-15: qty 2000

## 2015-11-15 MED ORDER — HYDROMORPHONE HCL 1 MG/ML IJ SOLN
1.0000 mg | INTRAMUSCULAR | Status: DC | PRN
  Administered 2015-11-15: 1 mg via INTRAVENOUS
  Filled 2015-11-15: qty 1

## 2015-11-15 MED ORDER — PANTOPRAZOLE SODIUM 40 MG PO TBEC
40.0000 mg | DELAYED_RELEASE_TABLET | Freq: Every day | ORAL | Status: DC
  Administered 2015-11-15 – 2015-11-17 (×3): 40 mg via ORAL
  Filled 2015-11-15 (×3): qty 1

## 2015-11-15 MED ORDER — SODIUM CHLORIDE 0.9 % IV SOLN
INTRAVENOUS | Status: AC
  Administered 2015-11-15: 13:00:00 via INTRAVENOUS

## 2015-11-15 MED ORDER — PIPERACILLIN-TAZOBACTAM 3.375 G IVPB
3.3750 g | Freq: Three times a day (TID) | INTRAVENOUS | Status: DC
  Administered 2015-11-15 – 2015-11-17 (×5): 3.375 g via INTRAVENOUS
  Filled 2015-11-15 (×9): qty 50

## 2015-11-15 MED ORDER — ONDANSETRON HCL 4 MG/2ML IJ SOLN: 4.0000 mg | Freq: Three times a day (TID) | INTRAMUSCULAR | Status: AC | PRN

## 2015-11-15 MED ORDER — ONDANSETRON HCL 4 MG/2ML IJ SOLN
INTRAMUSCULAR | Status: AC
  Filled 2015-11-15: qty 2

## 2015-11-15 MED ORDER — MORPHINE SULFATE (PF) 4 MG/ML IV SOLN
4.0000 mg | Freq: Once | INTRAVENOUS | Status: AC
  Administered 2015-11-15: 4 mg via INTRAVENOUS
  Filled 2015-11-15: qty 1

## 2015-11-15 MED ORDER — HYDROCODONE-ACETAMINOPHEN 5-325 MG PO TABS
1.0000 | ORAL_TABLET | ORAL | Status: DC | PRN
  Administered 2015-11-15 – 2015-11-16 (×4): 1 via ORAL
  Filled 2015-11-15 (×4): qty 1

## 2015-11-15 MED ORDER — ENOXAPARIN SODIUM 40 MG/0.4ML ~~LOC~~ SOLN
40.0000 mg | SUBCUTANEOUS | Status: DC
  Administered 2015-11-15: 40 mg via SUBCUTANEOUS
  Filled 2015-11-15: qty 0.4

## 2015-11-15 MED ORDER — PIPERACILLIN-TAZOBACTAM 3.375 G IVPB 30 MIN
3.3750 g | Freq: Once | INTRAVENOUS | Status: AC
  Administered 2015-11-15: 3.375 g via INTRAVENOUS
  Filled 2015-11-15: qty 50

## 2015-11-15 MED ORDER — ONDANSETRON HCL 4 MG/2ML IJ SOLN
4.0000 mg | Freq: Once | INTRAMUSCULAR | Status: AC
  Administered 2015-11-15: 4 mg via INTRAVENOUS
  Filled 2015-11-15: qty 2

## 2015-11-15 MED ORDER — MORPHINE SULFATE (PF) 2 MG/ML IV SOLN
INTRAVENOUS | Status: DC
Start: 2015-11-15 — End: 2015-11-15
  Filled 2015-11-15: qty 1

## 2015-11-15 MED ORDER — KETOROLAC TROMETHAMINE 30 MG/ML IJ SOLN
30.0000 mg | Freq: Four times a day (QID) | INTRAMUSCULAR | Status: DC | PRN
  Administered 2015-11-15: 30 mg via INTRAVENOUS
  Filled 2015-11-15 (×2): qty 1

## 2015-11-15 MED ORDER — IOPAMIDOL (ISOVUE-300) INJECTION 61%
INTRAVENOUS | Status: AC
  Administered 2015-11-15: 75 mL
  Filled 2015-11-15: qty 75

## 2015-11-15 NOTE — ED Provider Notes (Signed)
CSN: [id number]     Arrival date & time 11/15/15  0413 History   First MD Initiated Contact with Patient 11/15/15 [phone]     Chief Complaint  Patient presents with  . Abscess     (Consider location/radiation/quality/duration/timing/severity/associated sxs/prior Treatment) HPI   Dan Koch is a 35 y.o. male, with a history of hydradenitis suppertiva, presenting to the ED with complaint of recurrent abscesses for the last 3 weeks. Patient states that he had abscesses on his scrotum, around his anus, and in his right armpit. These were treated with I&D surgically around a month ago. Patient states that the surgery occurred in [place], where he happened to be visiting a friend. Patient was also placed on clindamycin for 2 weeks, which he finished. Patient states that the abscesses never fully went away and are beginning to grow and become more painful. Patient rates his pain at 10 out of 10, sharp and burning, nonradiating. Patient has been changing the bandages and the packing for his wounds multiple times a day, as directed. Patient adds his wounds previously tested positive for MRSA. Patient denies nausea/vomiting, abdominal pain, painful bowel movements, penile discharge, or any other complaints.    Past Medical History  Diagnosis Date  . Abscess   . Acne inversa     Hydradenitis suppurativa  . Hydradenitis    Past Surgical History  Procedure Laterality Date  . Hernia repair    . Incision and drainage     No family history on file. Social History  Substance Use Topics  . Smoking status: Never Smoker   . Smokeless tobacco: Never Used  . Alcohol Use: No    Review of Systems  Constitutional: Positive for fever (subjective), chills and diaphoresis.  Gastrointestinal: Negative for nausea, vomiting, abdominal pain and blood in stool.  Genitourinary: Positive for scrotal swelling, difficulty urinating and testicular pain. Negative for dysuria and discharge.       Abscesses  on scrotum and left gluteus.   Musculoskeletal: Negative for back pain.  All other systems reviewed and are negative.     Allergies  Strawberry extract; Tomato; and Tramadol  Home Medications   Prior to Admission medications   Medication Sig Start Date End Date Taking? Authorizing Provider  chlorhexidine (HIBICLENS) 4 % external liquid Apply topically daily as needed. Patient not taking: Reported on 11/15/2015 07/20/15   Leta Baptist, MD  clindamycin (CLEOCIN-T) 1 % external solution Apply topically 2 (two) times daily. Patient not taking: Reported on 11/15/2015 08/01/15   Elson Areas, PA-C  doxycycline (VIBRAMYCIN) 100 MG capsule Take 1 capsule (100 mg total) by mouth 2 (two) times daily. Patient not taking: Reported on 11/15/2015 05/06/15   Arthor Captain, PA-C  famotidine (PEPCID) 20 MG tablet Take 1 tablet (20 mg total) by mouth 2 (two) times daily. Patient not taking: Reported on 11/15/2015 07/20/15   Leta Baptist, MD  HYDROcodone-acetaminophen (NORCO/VICODIN) 5-325 MG tablet Take 1 tablet by mouth every 4 (four) hours as needed. Patient not taking: Reported on 11/15/2015 08/27/15   Barrett Henle, PA-C  hydrOXYzine (ATARAX/VISTARIL) 25 MG tablet Take 1 tablet (25 mg total) by mouth every 6 (six) hours. Patient not taking: Reported on 11/15/2015 08/06/15   Janne Napoleon, NP  ibuprofen (ADVIL,MOTRIN) 600 MG tablet Take 1 tablet (600 mg total) by mouth every 6 (six) hours as needed. Patient not taking: Reported on 11/15/2015 01/18/15   Renne Crigler, PA-C  methocarbamol (ROBAXIN) 500 MG tablet Take 2 tablets (1,000  mg total) by mouth 4 (four) times daily. Patient not taking: Reported on 11/15/2015 01/18/15   Renne Crigler, PA-C  mupirocin nasal ointment (BACTROBAN) 2 % Apply in each nostril daily Patient not taking: Reported on 11/15/2015 05/06/15   Arthor Captain, PA-C  ondansetron (ZOFRAN) 4 MG tablet Take 1 tablet (4 mg total) by mouth every 8 (eight) hours as needed for  nausea or vomiting. Patient not taking: Reported on 11/15/2015 07/20/15   Leta Baptist, MD  oxyCODONE-acetaminophen (PERCOCET/ROXICET) 5-325 MG tablet Take 2 tablets by mouth every 4 (four) hours as needed for severe pain. Patient not taking: Reported on 11/15/2015 10/19/15   Catha Gosselin, PA-C  tetracycline (ACHROMYCIN,SUMYCIN) 250 MG capsule Take 2 capsules (500 mg total) by mouth 2 (two) times daily. Patient not taking: Reported on 11/15/2015 08/27/15   Barrett Henle, PA-C  traMADol (ULTRAM) 50 MG tablet Take 1 tablet (50 mg total) by mouth every 8 (eight) hours as needed. Patient not taking: Reported on 11/15/2015 05/11/15   Jaclyn Shaggy, MD   BP 129/86 mmHg  Pulse 71  Temp(Src) 99 F (37.2 C) (Oral)  Resp 18  SpO2 100% Physical Exam  Constitutional: He appears well-developed and well-nourished. No distress.  HENT:  Head: Normocephalic and atraumatic.  Eyes: Conjunctivae are normal. Pupils are equal, round, and reactive to light.  Neck: Neck supple.  Cardiovascular: Normal rate, regular rhythm, normal heart sounds and intact distal pulses.   Pulmonary/Chest: Effort normal and breath sounds normal. No respiratory distress.  Abdominal: Soft. There is no tenderness. There is no guarding.  Genitourinary:  2 surgical incisions, one approximately 8 cm and one 3-4 cm, noted to the left scrotum. Scrotal swelling and exquisite tenderness noted. Purulent drainage present. 3 cm surgical incision noted to left gluteus with erythema and purulent discharge.    Musculoskeletal: He exhibits no edema or tenderness.  Lymphadenopathy:    He has no cervical adenopathy.       Right: No inguinal adenopathy present.       Left: No inguinal adenopathy present.  Neurological: He is alert.  Skin: Skin is warm and dry. He is not diaphoretic.  Psychiatric: He has a normal mood and affect. His behavior is normal.  Nursing note and vitals reviewed.   ED Course  Procedures (including  critical care time) Labs Review Labs Reviewed  COMPREHENSIVE METABOLIC PANEL - Abnormal; Notable for the following:    Chloride 100 (*)    BUN <5 (*)    Calcium 8.7 (*)    Albumin 2.8 (*)    All other components within normal limits  CBC WITH DIFFERENTIAL/PLATELET - Abnormal; Notable for the following:    Hemoglobin 8.7 (*)    HCT 29.4 (*)    MCV 63.1 (*)    MCH 18.7 (*)    MCHC 29.6 (*)    RDW 17.2 (*)    All other components within normal limits  URINE CULTURE  WOUND CULTURE  CULTURE, ROUTINE-ABSCESS  URINALYSIS, ROUTINE W REFLEX MICROSCOPIC (NOT AT [facility])  I-STAT CG4 LACTIC ACID, ED  I-STAT CG4 LACTIC ACID, ED   HEMOGLOBIN  Date Value Ref Range Status  11/15/2015 8.7* 13.0 - 17.0 g/dL Final  16/04/9603 54.0* 13.0 - 17.0 g/dL Final  98/05/9146 9.5* 13.0 - 17.0 g/dL Final  82/95/6213 08.6* 13.0 - 17.0 g/dL Final     Imaging Review Ct Abdomen Pelvis W Contrast  11/15/2015  CLINICAL DATA:  Left scrotal abscess EXAM: CT ABDOMEN AND PELVIS WITH CONTRAST TECHNIQUE: Multidetector  CT imaging of the abdomen and pelvis was performed using the standard protocol following bolus administration of intravenous contrast. CONTRAST:  75mL ISOVUE-300 IOPAMIDOL (ISOVUE-300) INJECTION 61% COMPARISON:  05/06/2015 FINDINGS: Lower chest:  Lung bases are unremarkable. Hepatobiliary: No focal hepatic mass. No calcified gallstones are noted within gallbladder. Pancreas: No mass, inflammatory changes, or other significant abnormality. Spleen: Within normal limits in size and appearance. Adrenals/Urinary Tract: No adrenal gland mass is noted. Enhanced kidneys are symmetrical in size. Left extrarenal pelvis again noted. No hydronephrosis or hydroureter. Mild distended urinary bladder without bladder filling defects or calculi. Stomach/Bowel: No gastric outlet obstruction. No thickened or dilated small bowel loops. No small bowel obstruction. No pericecal inflammation. Normal appendix is noted in axial image  59. Moderate stool noted in right colon transverse colon. Some colonic stool noted in descending colon. No distal colonic obstruction. No evidence of colitis or diverticulitis. Vascular/Lymphatic: No aortic aneurysm. No retroperitoneal or mesenteric adenopathy. Mild enlarged right external iliac lymph node measures 1.2 cm in diameter without significant change from prior exam. Borderline left external iliac lymph node stable from prior exam. Right inguinal lymph node measures 1.2 cm short-axis probable reactive. Left inguinal lymph node measures 1.2 cm short-axis probable reactive. Reproductive: Prostate gland and seminal vesicles are unremarkable. Other: Again noted significant skin thickening at gluteal crease/cleft bilaterally. There is also skin thickening along the perineum. Findings are consistent with extensive dermatitis or cellulitis. There is a superficial subcutaneous abscess axial image 85 in left medial gluteal region measures 1.6 cm. Somewhat elongated subcutaneous superficial fluid collection in left gluteal region axial image 98 measures 1.6 cm. There is a third confluent superficial fluid collection left gluteal region axial image 104 measures 3.2 cm. Findings are highly suspicious for multiple superficial abscesses. There is also skin thickening about the base of the scrotum without significant change from prior exam. There is thickening of scrotal wall and mild subcutaneous edema. There is small amount of fluid and subcutaneous edema anterior perineum base of the scrotum without definite evidence of a confluent scrotal abscess. There is also skin thickening in anterior pelvic wall just above the penis. Please see axial image 78. No definite evidence of perianal or perirectal abscess. Small superficial subcutaneous fluid collection right gluteal cleft medially axial image 105 and measures 2 cm suspicious for tiny superficial abscess. Musculoskeletal: No destructive bony lesions are noted. Sagittal  images of the spine are unremarkable. IMPRESSION: 1. Again noted significant skin thickening at gluteal crease/cleft bilaterally. There is also skin thickening along the perineum. Progression of skin thickening in left gluteal region. Findings are consistent with extensive dermatitis or cellulitis. There is a superficial subcutaneous abscess axial image 85 in left medial gluteal region measures 1.6 cm. Somewhat elongated subcutaneous superficial fluid collection in left gluteal region axial image 98 measures 1.6 cm. There is a third confluent superficial fluid collection left gluteal region axial image 104 measures 3.2 cm. Findings are highly suspicious for multiple superficial abscesses. There is also skin thickening about the base of the scrotum without significant change from prior exam. There is thickening of scrotal wall and mild subcutaneous edema. There is small amount of fluid and subcutaneous edema anterior perineum base of the scrotum without definite evidence of a confluent scrotal abscess. There is also skin thickening in anterior pelvic wall just above the penis. Please see axial image 78. No definite evidence of perianal or perirectal abscess. 2. Again noted bilateral external iliac and inguinal probable or reactive adenopathy. 3. No small bowel or  colonic obstruction. No pericecal inflammation. Normal appendix. Moderate stool noted in right colon and transverse colon. 4. No hydronephrosis or hydroureter. Electronically Signed   By: Natasha Mead M.D.   On: 11/15/2015 09:39   I have personally reviewed and evaluated these images and lab results as part of my medical decision-making.   EKG Interpretation None       Medications  ondansetron (ZOFRAN) 4 MG/2ML injection (not administered)  vancomycin (VANCOCIN) 1,250 mg in sodium chloride 0.9 % 250 mL IVPB (not administered)  morphine 4 MG/ML injection 4 mg (4 mg Intravenous Given 11/15/15 0653)  ondansetron (ZOFRAN) injection 4 mg (4 mg  Intravenous Given 11/15/15 0653)  sodium chloride 0.9 % bolus 1,000 mL (1,000 mLs Intravenous New Bag/Given 11/15/15 0750)  vancomycin (VANCOCIN) 2,000 mg in sodium chloride 0.9 % 500 mL IVPB (2,000 mg Intravenous New Bag/Given 11/15/15 0814)  morphine 4 MG/ML injection 4 mg (4 mg Intravenous Given 11/15/15 0814)  iopamidol (ISOVUE-300) 61 % injection (75 mLs  Contrast Given 11/15/15 0849)   Orders Placed This Encounter  Procedures  . Urine culture  . Wound culture  . Culture, routine-abscess  . CT Abdomen Pelvis W Contrast  . Comprehensive metabolic panel  . CBC with Differential  . Urinalysis, Routine w reflex microscopic  . Vancomycin per pharmacy consult  . Consult for [facility] Admission  . I-Stat CG4 Lactic Acid, ED  . Insert peripheral IV  . Admit to Inpatient (patient's expected length of stay will be greater than 2 midnights or inpatient only procedure)    MDM   Final diagnoses:  Scrotal abscess  Cellulitis of scrotum  Gluteal abscess    Ranae Palms Presents with recurrent abscesses to the scrotum, gluteus, and axilla, worsening over the last 2-3 weeks.  Findings and plan of care discussed with Gerhard Munch, MD. Dr. Jeraldine Loots personally evaluated and examined this patient.  This patient's presentation gives concern for recurrent abscesses with surrounding cellulitis. Patient is nontoxic appearing, afebrile, not tachycardic, and not tachypneic. Patient has no signs of sepsis or other serious or life-threatening condition. Furthermore, patient has a normal WBC count and lactic acid. Anemia is noted, however, patient does not seem to have any symptoms associated with this finding. Abdominal CT obtained due to the location of the patient's abscesses. Wound cultures obtained due to patient having a previous course of antibiotics seemed to have failed. There are no areas of definite abscess noted that would benefit from drainage. Upon reevaluation, patient states  his pain has improved. 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 About 3 superficial abscesses noted in the gluteal region. 10:30 AM Spoke with Dr. Yetta Barre, from the internal medicine teaching service, who agreed to admit the patient to Med-Surg under Dr. Heide Spark as the attending.   Filed Vitals:   11/15/15 0419 11/15/15 0645 11/15/15 0700 11/15/15 0745  BP: 136/93 133/76 128/79 126/81  Pulse: 94 83 80 75  Temp: 98.2 F (36.8 C)     TempSrc: Oral     Resp: 18     SpO2: 99% 100% 98% 100%   Filed Vitals:   11/15/15 0745 11/15/15 0815 11/15/15 0825 11/15/15 1015  BP: 126/81 130/72  129/86  Pulse: 75 74  71  Temp:   99 F (37.2 C)   TempSrc:   Oral   Resp:      SpO2: 100% 100%  100%       Anselm Pancoast, PA-C 11/15/15 1101  Molly Maduro  Jeraldine Loots, MD 11/16/15 1057

## 2015-11-15 NOTE — Progress Notes (Signed)
Patient refused lovenox. Educated, still refused. md flores notified.

## 2015-11-15 NOTE — Progress Notes (Signed)
Patient stated he had increased edema in penis, rn notified md.

## 2015-11-15 NOTE — Progress Notes (Signed)
Sitz bath kit arrived to floor, will notify night shift. Patient questioned sitz bath, stated he read he should not do one. MD notified, MD stated it was okay for patient to do sitz bath, if patient has increased pain then patient needs to stop sitz bath

## 2015-11-15 NOTE — Progress Notes (Addendum)
Pharmacy Antibiotic Note  Ranae PalmsSamuel Baker Konen is a 35 y.o. male admitted on 11/15/2015 with cellulitis. S/p clinda x 2 weeks.  Pharmacy has been consulted for vanc/zosyn dosing. AF, wbc wnl, SCr 1.07 on admit, normalized CrCl~99.  Plan: Vanc 2g IV x1; then 1250mg  IV q12h Zosyn 3.375g IV (30min inf) x1; then 3.375g IV q8h (4h inf) Monitor clinical progress, c/s, renal function, abx plan/LOT VT@SS  as indicated      Temp (24hrs), Avg:98.2 F (36.8 C), Min:98.2 F (36.8 C), Max:98.2 F (36.8 C)   Recent Labs Lab 11/15/15 0704  LATICACIDVEN 0.85    CrCl cannot be calculated (Unknown ideal weight.).    Allergies  Allergen Reactions  . Strawberry Extract Swelling  . Tomato Swelling  . Tramadol Other (See Comments)    Pt reports negative personality changes    Antimicrobials this admission: 4/25 vanc >>  4/25 zosyn >>  Dose adjustments this admission: n/a  Microbiology results: 4/25 wound: 4/25 UCx:     Babs BertinHaley Richanda Darin, PharmD, BCPS Clinical Pharmacist Pager [phone] 11/15/2015 7:48 AM

## 2015-11-15 NOTE — Consult Note (Signed)
Urology Consult  Referring physician:  Dr. Aldine Contes Reason for referral:  Hydradenitis suppritiva  Impression/Assessment:   Hidradentis Suppuritiva, post I/D scrotal abscess, q month ago in [place]. Wound has no erythema, but still edematous, with wick of drain. Scrotum clean. Inguinal glands tender, but no abscess.   Plan:   Agree with general Surgery A/P for soaks, dressing changes, wound care consult, antibiotics. Anticipate long recovery with multiple recurrences of acute infection. Pt is from [place], [place]. ? Local MD  For f/u care.    History of Present Illness: Dan Koch is a 35 y.o. male, with a history of hydradenitis suppertiva, presenting to the ED today with complaint of recurrent abscesses for the last 3 weeks.    Patient states that he had abscesses on his scrotum, around his anus, and in his right armpit,  treated with I&D surgically  a month ago while travelling in [place]. He was also placed on clindamycin for 2 weeks, which he finished; however,  the abscesses never fully went away and are beginning to grow and become more painful. His  his pain on admission is at 10 out of 10, sharp and burning, nonradiating.    He has been changing the bandages and the packing for his wounds multiple times a day himself, as directed. 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    He denies nausea/vomiting, abdominal pain, painful bowel movements, penile discharge, or any other complaints.    Past Medical History  Diagnosis Date  . Abscess   . Acne inversa     Hydradenitis suppurativa  . Hydradenitis    Past Surgical History  Procedure Laterality Date  . Hernia repair    . Incision and drainage      Medications: Home meds: Medication Sig Start Date End Date Taking? Authorizing Provider  chlorhexidine (HIBICLENS) 4 % external liquid Apply topically daily as needed. Patient not taking: Reported on 11/15/2015 07/20/15   Harvel Quale, MD   clindamycin (CLEOCIN-T) 1 % external solution Apply topically 2 (two) times daily. Patient not taking: Reported on 11/15/2015 08/01/15   Fransico Meadow, PA-C  doxycycline (VIBRAMYCIN) 100 MG capsule Take 1 capsule (100 mg total) by mouth 2 (two) times daily. Patient not taking: Reported on 11/15/2015 05/06/15   Margarita Mail, PA-C  famotidine (PEPCID) 20 MG tablet Take 1 tablet (20 mg total) by mouth 2 (two) times daily. Patient not taking: Reported on 11/15/2015 07/20/15   Harvel Quale, MD  HYDROcodone-acetaminophen (NORCO/VICODIN) 5-325 MG tablet Take 1 tablet by mouth every 4 (four) hours as needed. Patient not taking: Reported on 11/15/2015 08/27/15   Nona Dell, PA-C  hydrOXYzine (ATARAX/VISTARIL) 25 MG tablet Take 1 tablet (25 mg total) by mouth every 6 (six) hours. Patient not taking: Reported on 11/15/2015 08/06/15   Ashley Murrain, NP  ibuprofen (ADVIL,MOTRIN) 600 MG tablet Take 1 tablet (600 mg total) by mouth every 6 (six) hours as needed. Patient not taking: Reported on 11/15/2015 01/18/15   Carlisle Cater, PA-C  methocarbamol (ROBAXIN) 500 MG tablet Take 2 tablets (1,000 mg total) by mouth 4 (four) times daily. Patient not taking: Reported on 11/15/2015 01/18/15   Carlisle Cater, PA-C  mupirocin nasal ointment (BACTROBAN) 2 % Apply in each nostril daily Patient not taking: Reported on 11/15/2015 05/06/15   Margarita Mail, PA-C  ondansetron (ZOFRAN) 4 MG tablet Take 1 tablet (4 mg total) by mouth every 8 (eight) hours as needed for nausea or vomiting. Patient not taking: Reported  on 11/15/2015 07/20/15   Harvel Quale, MD  oxyCODONE-acetaminophen (PERCOCET/ROXICET) 5-325 MG tablet Take 2 tablets by mouth every 4 (four) hours as needed for severe pain. Patient not taking: Reported on 11/15/2015 10/19/15   Ottie Glazier, PA-C  tetracycline (ACHROMYCIN,SUMYCIN) 250 MG capsule Take 2 capsules (500 mg total) by mouth 2 (two)  times daily. Patient not taking: Reported on 11/15/2015 08/27/15   Nona Dell, PA-C  traMADol (ULTRAM) 50 MG tablet Take 1 tablet (50 mg total) by mouth every 8 (eight) hours as needed. Patient not taking: Reported on 11/15/2015           Current facility-administered medications:  . 0.9 % sodium chloride infusion, , Intravenous, Continuous, Corky Sox, MD . enoxaparin (LOVENOX) injection 40 mg, 40 mg, Subcutaneous, Q24H, Corky Sox, MD . HYDROcodone-acetaminophen (NORCO/VICODIN) 5-325 MG per tablet 1 tablet, 1 tablet, Oral, Q4H PRN, Corky Sox, MD . HYDROmorphone (DILAUDID) injection 1 mg, 1 mg, Intravenous, Q4H PRN, Shawn C Joy, PA-C . ondansetron (ZOFRAN) 4 MG/2ML injection, , , ,  . ondansetron (ZOFRAN) injection 4 mg, 4 mg, Intravenous, Q8H PRN, Shawn C Joy, PA-C . piperacillin-tazobactam (ZOSYN) IVPB 3.375 g, 3.375 g, Intravenous, Once, Romona Curls, [facility] . piperacillin-tazobactam (ZOSYN) IVPB 3.375 g, 3.375 g, Intravenous, Q8H, Romona Curls, RPH . vancomycin (VANCOCIN) 1,250 mg in sodium chloride 0.9 % 250 mL IVPB, 1,250 mg, Intravenous, Q12H, Romona Curls, RPH  Allergies:  Allergies  Allergen Reactions  . Strawberry Extract Swelling  . Tomato Swelling  . Tramadol Other (See Comments)    Pt reports negative personality changes    No family history on file.  Social History:  reports that he has never smoked. He has never used smokeless tobacco. He reports that he does not drink alcohol or use illicit drugs.  ROS: Constitutional: Positive for fever (subjective), chills and diaphoresis.  Gastrointestinal: Negative for nausea, vomiting, abdominal pain and blood in stool.  Genitourinary: Positive for scrotal swelling, difficulty urinating and testicular pain. Negative for dysuria and discharge.   Abscesses on scrotum and left gluteus.  Musculoskeletal: Negative for back pain.  All other systems reviewed and are negative.  Physical  Exam:  Vital signs in last 24 hours: Temp:  [98.2 F (36.8 C)-99 F (37.2 C)] 98.6 F (37 C) (04/25 1852) Pulse Rate:  [71-94] 81 (04/25 1819) Resp:  [16-18] 16 (04/25 1819) BP: (121-139)/(70-93) 121/70 mmHg (04/25 1819) SpO2:  [98 %-100 %] 100 % (04/25 1819) Weight:  [104.3 kg (229 lb 15 oz)] 104.3 kg (229 lb 15 oz) (04/25 1800) Physical Exam  Constitutional: He is oriented to person, place, and time. He appears well-developed and well-nourished. No distress.  HENT:  Head: Normocephalic.  Nose: Nose normal.  Eyes: Conjunctivae and EOM are normal. Right eye exhibits no discharge. Left eye exhibits no discharge. No scleral icterus.  Neck: Normal range of motion. Neck supple. No JVD present. No tracheal deviation present. No thyromegaly present.  Cardiovascular: Normal rate, regular rhythm, normal heart sounds and intact distal pulses.  Respiratory: Effort normal and breath sounds normal.  GI: Soft. Bowel sounds are normal. He exhibits no distension. There is no tenderness. There is no rebound and no guarding.  Genitourinary:  He has lesions and open area left scrotum, about 5 cm long, areas on right scrotum also. Both groins and buttocks around his rectum.  Musculoskeletal: He exhibits edema. He exhibits no tenderness.  Lymphadenopathy:   He has no cervical adenopathy.  Neurological: He  is alert and oriented to person, place, and time. No cranial nerve deficit.  Skin: Skin is warm. He is not diaphoretic.  He has multiple lesions right axilla, left axilla is clear. Both groins, left more than right, scrotum both sides, the one on the left is open and about 4-5 cm long, 7 mm wide and about 5 mm deep. He has lesions both buttocks, around the rectum. Small 1 cm lesion right mid thigh.  Psychiatric: He has a normal mood and affect. His behavior is normal. Judgment and thought content normal.   Laboratory Data:  Results for orders placed or performed during the hospital encounter  of 11/15/15 (from the past 72 hour(s))  I-Stat CG4 Lactic Acid, ED     Status: None   Collection Time: 11/15/15  7:04 AM  Result Value Ref Range   Lactic Acid, Venous 0.85 0.5 - 2.0 mmol/L  Comprehensive metabolic panel     Status: Abnormal   Collection Time: 11/15/15  7:10 AM  Result Value Ref Range   Sodium 135 135 - 145 mmol/L   Potassium 3.8 3.5 - 5.1 mmol/L   Chloride 100 (L) 101 - 111 mmol/L   CO2 26 22 - 32 mmol/L   Glucose, Bld 98 65 - 99 mg/dL   BUN <5 (L) 6 - 20 mg/dL   Creatinine, Ser 1.07 0.61 - 1.24 mg/dL   Calcium 8.7 (L) 8.9 - 10.3 mg/dL   Total Protein 7.6 6.5 - 8.1 g/dL   Albumin 2.8 (L) 3.5 - 5.0 g/dL   AST 19 15 - 41 U/L   ALT 28 17 - 63 U/L   Alkaline Phosphatase 68 38 - 126 U/L   Total Bilirubin 0.5 0.3 - 1.2 mg/dL   GFR calc non Af Amer >60 >60 mL/min   GFR calc Af Amer >60 >60 mL/min    Comment: (NOTE) The eGFR has been calculated using the CKD EPI equation. This calculation has not been validated in all clinical situations. eGFR's persistently <60 mL/min signify possible Chronic Kidney Disease.    Anion gap 9 5 - 15  CBC with Differential     Status: Abnormal   Collection Time: 11/15/15  7:10 AM  Result Value Ref Range   WBC 8.3 4.0 - 10.5 K/uL   RBC 4.66 4.22 - 5.81 MIL/uL   Hemoglobin 8.7 (L) 13.0 - 17.0 g/dL   HCT 29.4 (L) 39.0 - 52.0 %   MCV 63.1 (L) 78.0 - 100.0 fL   MCH 18.7 (L) 26.0 - 34.0 pg   MCHC 29.6 (L) 30.0 - 36.0 g/dL   RDW 17.2 (H) 11.5 - 15.5 %   Platelets 396 150 - 400 K/uL   Neutrophils Relative % 62 %   Lymphocytes Relative 27 %   Monocytes Relative 8 %   Eosinophils Relative 3 %   Basophils Relative 0 %   Neutro Abs 5.1 1.7 - 7.7 K/uL   Lymphs Abs 2.2 0.7 - 4.0 K/uL   Monocytes Absolute 0.7 0.1 - 1.0 K/uL   Eosinophils Absolute 0.3 0.0 - 0.7 K/uL   Basophils Absolute 0.0 0.0 - 0.1 K/uL   RBC Morphology ELLIPTOCYTES   Urinalysis, Routine w reflex microscopic     Status: None   Collection Time: 11/15/15  7:58 AM  Result  Value Ref Range   Color, Urine YELLOW YELLOW   APPearance CLEAR CLEAR   Specific Gravity, Urine 1.012 1.005 - 1.030   pH 6.0 5.0 - 8.0   Glucose, UA NEGATIVE  NEGATIVE mg/dL   Hgb urine dipstick NEGATIVE NEGATIVE   Bilirubin Urine NEGATIVE NEGATIVE   Ketones, ur NEGATIVE NEGATIVE mg/dL   Protein, ur NEGATIVE NEGATIVE mg/dL   Nitrite NEGATIVE NEGATIVE   Leukocytes, UA NEGATIVE NEGATIVE    Comment: MICROSCOPIC NOT DONE ON URINES WITH NEGATIVE PROTEIN, BLOOD, LEUKOCYTES, NITRITE, OR GLUCOSE <1000 mg/dL.   No results found for this or any previous visit (from the past 240 hour(s)). Creatinine:  Recent Labs  11/15/15 0710  CREATININE 1.07      Zenaida Tesar I Daria Mcmeekin 11/15/2015, 8:10 PM

## 2015-11-15 NOTE — H&P (Signed)
Date: 11/15/2015               Patient Name:  Dan Koch MRN: [id number]  DOB: Nov 26, 1980 Age / Sex: 35 y.o., male   PCP: Jaclyn Shaggy, MD         Medical Service: Internal Medicine Teaching Service         Attending Physician: Dr. Earl Lagos, MD    First Contact: Dr. Earnest Conroy Pager: [phone]  Second Contact: Dr. Allena Katz Pager: [phone]       After Hours (After 5p/  First Contact Pager: [phone]  weekends / holidays): Second Contact Pager: [phone]   Chief Complaint: Gluteal/scrotal pain and swelling  History of Present Illness: Dan Koch is a 35 y.o. male w/ PMHx of hydradenitis suppuritiva, presents to the ED w/ complaints of worsening gluteal and scrotal pain and swelling. Patient states this has been an issue on his gluteal region for quite some time, starting with boils initially, then progressing to worsening pain and swelling. He has been seen in the ED multiple times for this, in his groin, gluteal region, and left axilla. Has been treated with course of antibiotics in the past. 3 weeks ago, patient was traveling to see friends in [place] and says that the pain, swelling, and discharge in his right axilla, groin, and gluteal region was so severe he went to the ED. He apparently was hospitalized for a short time, had I&D in the right axilla and left gluteus. He was given Clindamycin per patient and stopped taking this as recently as 1-2 weeks ago. At no time did he say he experienced fever, chills, nausea, vomiting, decreased appetite, palpitations, or diarrhea. Also no SOB, chest pain, headache. Today, he states that his pain and swelling was too overwhelming and has progressively gotten worse over the past week. Continue to deny systemic symptoms. Patient denies any significant family history of similar issues, only significant for DM type II in his mother. Patient denies drinking alcohol, recreational drugs, or smoking. Not currently sexually active, no  previous h/o STD's.    Meds: Current Facility-Administered Medications  Medication Dose Route Frequency Provider Last Rate Last Dose  . 0.9 %  sodium chloride infusion   Intravenous Continuous Courtney Paris, MD      . enoxaparin (LOVENOX) injection 40 mg  40 mg Subcutaneous Q24H Courtney Paris, MD      . HYDROcodone-acetaminophen (NORCO/VICODIN) 5-325 MG per tablet 1 tablet  1 tablet Oral Q4H PRN Courtney Paris, MD      . HYDROmorphone (DILAUDID) injection 1 mg  1 mg Intravenous Q4H PRN Shawn C Joy, PA-C      . ondansetron (ZOFRAN) 4 MG/2ML injection           . ondansetron (ZOFRAN) injection 4 mg  4 mg Intravenous Q8H PRN Shawn C Joy, PA-C      . piperacillin-tazobactam (ZOSYN) IVPB 3.375 g  3.375 g Intravenous Once Almon Hercules, [facility]      . piperacillin-tazobactam (ZOSYN) IVPB 3.375 g  3.375 g Intravenous Q8H Almon Hercules, [facility]      . vancomycin (VANCOCIN) 1,250 mg in sodium chloride 0.9 % 250 mL IVPB  1,250 mg Intravenous Q12H Almon Hercules, [facility]        Allergies: Allergies as of 11/15/2015 - Review Complete 11/15/2015  Allergen Reaction Noted  . Strawberry extract Swelling 09/15/2014  . Tomato Swelling 09/15/2014  . Tramadol Other (See Comments) 05/06/2015   Past Medical History  Diagnosis Date  . Abscess   . Acne inversa     Hydradenitis suppurativa  . Hydradenitis    Past Surgical History  Procedure Laterality Date  . Hernia repair    . Incision and drainage     No family history on file.   Social History   Social History  . Marital Status: Single    Spouse Name: N/A  . Number of Children: N/A  . Years of Education: N/A   Occupational History  . Not on file.   Social History Main Topics  . Smoking status: Never Smoker   . Smokeless tobacco: Never Used  . Alcohol Use: No  . Drug Use: No  . Sexual Activity: Not Currently   Other Topics Concern  . Not on file   Social History Narrative    Review of Systems: General: Denies fever, chills, diaphoresis,  appetite change and fatigue.  Respiratory: Denies SOB, DOE, cough, chest tightness, and wheezing.   Cardiovascular: Denies chest pain and palpitations.  Gastrointestinal: Denies nausea, vomiting, abdominal pain, diarrhea, constipation, blood in stool and abdominal distention.  Genitourinary: Denies dysuria, urgency, frequency, hematuria, and flank pain. Endocrine: Denies hot or cold intolerance, polyuria, and polydipsia. Musculoskeletal: Positive for groin, gluteal, and axillary pain. Denies myalgias, back pain, joint swelling, arthralgias and gait problem.  Skin: Positive for draining, painful lesions in right axilla, groin, buttocks, and scrotum. Denies pallor.  Neurological: Denies dizziness, seizures, syncope, weakness, lightheadedness, numbness and headaches.  Psychiatric/Behavioral: Denies mood changes, confusion, nervousness, sleep disturbance and agitation.    Physical Exam: Blood pressure 127/83, pulse 76, temperature 98.9 F (37.2 C), temperature source Oral, resp. rate 18, SpO2 100 %.  General: AA male, alert, cooperative, NAD. HEENT: PERRL, EOMI. Moist mucus membranes. Multiple tender healing lesions on the face.  Neck: Full range of motion without pain, supple. No carotid bruits. Submental lymphadenopathy vs sebacous cysts.  Axilla: Left axilla with multiple indurated, tender lesions draining serous/purulent appearing fluid.  Lungs: Clear to ascultation bilaterally, normal work of respiration, no wheezes, rales, rhonchi Heart: RRR, no murmurs, gallops, or rubs Abdomen: Soft, non-tender, non-distended, BS + Groin/buttocks: Multiple large, tender, serous draining lesions on groin bilaterally. Buttocks severely tender, indurated, fluctuant in some places, with scattered drainage. Previous I&D on the right buttock. Scrotum tender, swollen, edematous, with large open appearing wound on the right (6 cm x 1 cm).  Extremities: No cyanosis, clubbing, or edema Neurologic: Alert &  oriented X3, cranial nerves II-XII intact, strength grossly intact, sensation intact to light touch   Lab results: Basic Metabolic Panel:  Recent Labs  16/10/96 0710  NA 135  K 3.8  CL 100*  CO2 26  GLUCOSE 98  BUN <5*  CREATININE 1.07  CALCIUM 8.7*   Liver Function Tests:  Recent Labs  11/15/15 0710  AST 19  ALT 28  ALKPHOS 68  BILITOT 0.5  PROT 7.6  ALBUMIN 2.8*   CBC:  Recent Labs  11/15/15 0710  WBC 8.3  NEUTROABS 5.1  HGB 8.7*  HCT 29.4*  MCV 63.1*  PLT 396   Urinalysis:  Recent Labs  11/15/15 0758  COLORURINE YELLOW  LABSPEC 1.012  PHURINE 6.0  GLUCOSEU NEGATIVE  HGBUR NEGATIVE  BILIRUBINUR NEGATIVE  KETONESUR NEGATIVE  PROTEINUR NEGATIVE  NITRITE NEGATIVE  LEUKOCYTESUR NEGATIVE    Imaging results:  Ct Abdomen Pelvis W Contrast  11/15/2015  CLINICAL DATA:  Left scrotal abscess EXAM: CT ABDOMEN AND PELVIS WITH CONTRAST TECHNIQUE: Multidetector CT imaging of the abdomen and  pelvis was performed using the standard protocol following bolus administration of intravenous contrast. CONTRAST:  75mL ISOVUE-300 IOPAMIDOL (ISOVUE-300) INJECTION 61% COMPARISON:  05/06/2015 FINDINGS: Lower chest:  Lung bases are unremarkable. Hepatobiliary: No focal hepatic mass. No calcified gallstones are noted within gallbladder. Pancreas: No mass, inflammatory changes, or other significant abnormality. Spleen: Within normal limits in size and appearance. Adrenals/Urinary Tract: No adrenal gland mass is noted. Enhanced kidneys are symmetrical in size. Left extrarenal pelvis again noted. No hydronephrosis or hydroureter. Mild distended urinary bladder without bladder filling defects or calculi. Stomach/Bowel: No gastric outlet obstruction. No thickened or dilated small bowel loops. No small bowel obstruction. No pericecal inflammation. Normal appendix is noted in axial image 59. Moderate stool noted in right colon transverse colon. Some colonic stool noted in descending colon.  No distal colonic obstruction. No evidence of colitis or diverticulitis. Vascular/Lymphatic: No aortic aneurysm. No retroperitoneal or mesenteric adenopathy. Mild enlarged right external iliac lymph node measures 1.2 cm in diameter without significant change from prior exam. Borderline left external iliac lymph node stable from prior exam. Right inguinal lymph node measures 1.2 cm short-axis probable reactive. Left inguinal lymph node measures 1.2 cm short-axis probable reactive. Reproductive: Prostate gland and seminal vesicles are unremarkable. Other: Again noted significant skin thickening at gluteal crease/cleft bilaterally. There is also skin thickening along the perineum. Findings are consistent with extensive dermatitis or cellulitis. There is a superficial subcutaneous abscess axial image 85 in left medial gluteal region measures 1.6 cm. Somewhat elongated subcutaneous superficial fluid collection in left gluteal region axial image 98 measures 1.6 cm. There is a third confluent superficial fluid collection left gluteal region axial image 104 measures 3.2 cm. Findings are highly suspicious for multiple superficial abscesses. There is also skin thickening about the base of the scrotum without significant change from prior exam. There is thickening of scrotal wall and mild subcutaneous edema. There is small amount of fluid and subcutaneous edema anterior perineum base of the scrotum without definite evidence of a confluent scrotal abscess. There is also skin thickening in anterior pelvic wall just above the penis. Please see axial image 78. No definite evidence of perianal or perirectal abscess. Small superficial subcutaneous fluid collection right gluteal cleft medially axial image 105 and measures 2 cm suspicious for tiny superficial abscess. Musculoskeletal: No destructive bony lesions are noted. Sagittal images of the spine are unremarkable. IMPRESSION: 1. Again noted significant skin thickening at gluteal  crease/cleft bilaterally. There is also skin thickening along the perineum. Progression of skin thickening in left gluteal region. Findings are consistent with extensive dermatitis or cellulitis. There is a superficial subcutaneous abscess axial image 85 in left medial gluteal region measures 1.6 cm. Somewhat elongated subcutaneous superficial fluid collection in left gluteal region axial image 98 measures 1.6 cm. There is a third confluent superficial fluid collection left gluteal region axial image 104 measures 3.2 cm. Findings are highly suspicious for multiple superficial abscesses. There is also skin thickening about the base of the scrotum without significant change from prior exam. There is thickening of scrotal wall and mild subcutaneous edema. There is small amount of fluid and subcutaneous edema anterior perineum base of the scrotum without definite evidence of a confluent scrotal abscess. There is also skin thickening in anterior pelvic wall just above the penis. Please see axial image 78. No definite evidence of perianal or perirectal abscess. 2. Again noted bilateral external iliac and inguinal probable or reactive adenopathy. 3. No small bowel or colonic obstruction. No pericecal inflammation. Normal  appendix. Moderate stool noted in right colon and transverse colon. 4. No hydronephrosis or hydroureter. Electronically Signed   By: Natasha Mead M.D.   On: 11/15/2015 09:39    Other results: EKG: pending  Assessment & Plan by Problem: [name] is a 35 y.o. male w/ PMHx of hydradenitis suppuritiva, admitted for worsening gluteal and scrotal cellulitis.   Scrotal/Gluteal Cellulitis: Patient with quite complicated hydradenitis in the right axilla, bilateral groin, and buttock. Has been treated with antibiotics before. Had recent I&D in the right axilla and buttocks. Finished a course of Clindamycin and says it is now worse. Several regions of exquisitely tender, indurated, and some  fluctuant regions in the right axilla, groin bilaterally, and buttocks. Some scattered serous/purulent drainage. Scrotum enlarged, tender, edematous, with open appearing lesion on the left side. No systemic symptoms, no leukocytosis. CT scan in the ED shows extensive dermatitis or cellulitis as well as asuperficial subcutaneous abscess in the left medial gluteal region measuring 1.6 cm, a somewhat elongated subcutaneous superficial fluid collection also in the left gluteal region also measuring 1.6 cm and a third confluent superficial fluid collection in the left gluteal region measuring 3.2 cm. There is also skin thickening about the base of the scrotum without significant change from prior exam. There is also thickening of the scrotal wall and mild subcutaneous edema. There is small amount of fluid and subcutaneous edema in the anterior perineum and base of the scrotum without definite evidence of a confluent scrotal abscess. There is also skin thickening in anterior pelvic wall just above the penis.No definite evidence of perianal or perirectal abscess seen. Suspect this may be  complicated hydradenitis, however, some concern that this could progress into worsening abscess formation and cellulitis.  -Admit to med-surg -Continue Vancomycin + Zosyn for now given severity on exam -Surgery consult; appreciate recs -Pain control with Vicodin 5-325 mg q4h prn -Zofran prn for nausea -If worsening clinical status, can also consider urology consult given involvement of scrotum -Check HIV -Wound culture pending -CBC, BMP in AM  Anemia: Hb 8.7, MVC 63. Most likely iron deficiency, although a cause for iron deficiency is unclear. May also be undiagnosed thalassemia trait, although RDW is elevated, which would support iron deficiency.  -Check ferritin, iron, TIBC, and retics -Repeat CBC in AM -May need further outpatient workup if iron deficiency   DVT/PE PPx: Lovenox [place]  Dispo: Disposition is deferred at  this time, awaiting improvement of current medical problems. Anticipated discharge in approximately 1-2 day(s).   The patient does have a current PCP (Jaclyn Shaggy, MD) and does not need an [facility] hospital follow-up appointment after discharge.  The patient does not have transportation limitations that hinder transportation to clinic appointments.  Signed: Courtney Paris, MD 11/15/2015, 11:56 AM

## 2015-11-15 NOTE — Consult Note (Signed)
Reason for Consult:  Hydradendritis  Referring Physician: Orie Fisherman   Dan Koch is an 35 y.o. male.  HPI: Pt with multiple visits to the ED with boils in multiple sites going back at least to 08/2014.  ED note reports abscesses on his scrotum, around his anus and right axilla.  These were treated last month with I&D in [place] while visiting a friend.  They have never healed and he presents with 10/10 pain.  Prior test were positive for MRSA Work up in the ED shows he is afebrile, VSS.  Labs show a normal WBC but some anemia, and low albumin, but are otherwise normal. CT of the abdomen shows:   significant skin thickening at gluteal crease/cleft bilaterally. There is also skin thickening along the perineum. Progression of skin thickening in left gluteal region. Findings are consistent with extensive dermatitis or cellulitis. There is a superficial subcutaneous abscess axial image 85 in left medial gluteal region measures 1.6 cm. Somewhat elongated subcutaneous superficial fluid collection in left gluteal region axial image 98 measures 1.6 cm. There is a third confluent superficial fluid collection left gluteal region axial image 104 measures 3.2 cm. Findings are highly suspicious for multiple superficial abscesses. There is also skin thickening about the base of the scrotum without significant change from prior exam. There is thickening of scrotal wall and mild subcutaneous edema. There is small amount of fluid and subcutaneous edema anterior perineum base of the scrotum without definite evidence of a confluent scrotal abscess. There is also skin thickening in anterior pelvic wall just above the penis. Please see axial image 78. No definite evidence of perianal or perirectal abscess.  Again noted bilateral external iliac and inguinal probable or reactive adenopathy. 3. No small bowel or colonic obstruction. No pericecal inflammation. Normal appendix. Moderate stool noted in right colon and  transverse Colon. 4. No hydronephrosis or hydroureter. Cultures are pending and we are ask to see.  Past Medical History  Diagnosis Date  . Abscess   . Acne inversa     Hydradenitis suppurativa  . Hydradenitis     Past Surgical History  Procedure Laterality Date  . Hernia repair    . Incision and drainage      No family history on file.  Social History:  reports that he has never smoked. He has never used smokeless tobacco. He reports that he does not drink alcohol or use illicit drugs.  Allergies:  Allergies  Allergen Reactions  . Strawberry Extract Swelling  . Tomato Swelling  . Tramadol Other (See Comments)    Pt reports negative personality changes    Current facility-administered medications:  .  0.9 %  sodium chloride infusion, , Intravenous, Continuous, Corky Sox, MD .  enoxaparin (LOVENOX) injection 40 mg, 40 mg, Subcutaneous, Q24H, Corky Sox, MD .  HYDROcodone-acetaminophen (NORCO/VICODIN) 5-325 MG per tablet 1 tablet, 1 tablet, Oral, Q4H PRN, Corky Sox, MD .  HYDROmorphone (DILAUDID) injection 1 mg, 1 mg, Intravenous, Q4H PRN, Shawn C Joy, PA-C .  ondansetron (ZOFRAN) 4 MG/2ML injection, , , ,  .  ondansetron (ZOFRAN) injection 4 mg, 4 mg, Intravenous, Q8H PRN, Shawn C Joy, PA-C .  piperacillin-tazobactam (ZOSYN) IVPB 3.375 g, 3.375 g, Intravenous, Once, Romona Curls, [facility] .  piperacillin-tazobactam (ZOSYN) IVPB 3.375 g, 3.375 g, Intravenous, Q8H, Romona Curls, RPH .  vancomycin (VANCOCIN) 1,250 mg in sodium chloride 0.9 % 250 mL IVPB, 1,250 mg, Intravenous, Q12H, Romona Curls, [facility] Prior to Admission  medications   Medication Sig Start Date End Date Taking? Authorizing Provider  chlorhexidine (HIBICLENS) 4 % external liquid Apply topically daily as needed. Patient not taking: Reported on 11/15/2015 07/20/15   Harvel Quale, MD  clindamycin (CLEOCIN-T) 1 % external solution Apply topically 2 (two) times daily. Patient not taking: Reported on 11/15/2015  08/01/15   Fransico Meadow, PA-C  doxycycline (VIBRAMYCIN) 100 MG capsule Take 1 capsule (100 mg total) by mouth 2 (two) times daily. Patient not taking: Reported on 11/15/2015 05/06/15   Margarita Mail, PA-C  famotidine (PEPCID) 20 MG tablet Take 1 tablet (20 mg total) by mouth 2 (two) times daily. Patient not taking: Reported on 11/15/2015 07/20/15   Harvel Quale, MD  HYDROcodone-acetaminophen (NORCO/VICODIN) 5-325 MG tablet Take 1 tablet by mouth every 4 (four) hours as needed. Patient not taking: Reported on 11/15/2015 08/27/15   Nona Dell, PA-C  hydrOXYzine (ATARAX/VISTARIL) 25 MG tablet Take 1 tablet (25 mg total) by mouth every 6 (six) hours. Patient not taking: Reported on 11/15/2015 08/06/15   Ashley Murrain, NP  ibuprofen (ADVIL,MOTRIN) 600 MG tablet Take 1 tablet (600 mg total) by mouth every 6 (six) hours as needed. Patient not taking: Reported on 11/15/2015 01/18/15   Carlisle Cater, PA-C  methocarbamol (ROBAXIN) 500 MG tablet Take 2 tablets (1,000 mg total) by mouth 4 (four) times daily. Patient not taking: Reported on 11/15/2015 01/18/15   Carlisle Cater, PA-C  mupirocin nasal ointment (BACTROBAN) 2 % Apply in each nostril daily Patient not taking: Reported on 11/15/2015 05/06/15   Margarita Mail, PA-C  ondansetron (ZOFRAN) 4 MG tablet Take 1 tablet (4 mg total) by mouth every 8 (eight) hours as needed for nausea or vomiting. Patient not taking: Reported on 11/15/2015 07/20/15   Harvel Quale, MD  oxyCODONE-acetaminophen (PERCOCET/ROXICET) 5-325 MG tablet Take 2 tablets by mouth every 4 (four) hours as needed for severe pain. Patient not taking: Reported on 11/15/2015 10/19/15   Ottie Glazier, PA-C  tetracycline (ACHROMYCIN,SUMYCIN) 250 MG capsule Take 2 capsules (500 mg total) by mouth 2 (two) times daily. Patient not taking: Reported on 11/15/2015 08/27/15   Nona Dell, PA-C  traMADol (ULTRAM) 50 MG tablet Take 1 tablet (50 mg total) by mouth every 8 (eight) hours  as needed. Patient not taking: Reported on 11/15/2015 05/11/15   Arnoldo Morale, MD     Results for orders placed or performed during the hospital encounter of 11/15/15 (from the past 48 hour(s))  I-Stat CG4 Lactic Acid, ED     Status: None   Collection Time: 11/15/15  7:04 AM  Result Value Ref Range   Lactic Acid, Venous 0.85 0.5 - 2.0 mmol/L  Comprehensive metabolic panel     Status: Abnormal   Collection Time: 11/15/15  7:10 AM  Result Value Ref Range   Sodium 135 135 - 145 mmol/L   Potassium 3.8 3.5 - 5.1 mmol/L   Chloride 100 (L) 101 - 111 mmol/L   CO2 26 22 - 32 mmol/L   Glucose, Bld 98 65 - 99 mg/dL   BUN <5 (L) 6 - 20 mg/dL   Creatinine, Ser 1.07 0.61 - 1.24 mg/dL   Calcium 8.7 (L) 8.9 - 10.3 mg/dL   Total Protein 7.6 6.5 - 8.1 g/dL   Albumin 2.8 (L) 3.5 - 5.0 g/dL   AST 19 15 - 41 U/L   ALT 28 17 - 63 U/L   Alkaline Phosphatase 68 38 - 126 U/L   Total Bilirubin  0.5 0.3 - 1.2 mg/dL   GFR calc non Af Amer >60 >60 mL/min   GFR calc Af Amer >60 >60 mL/min    Comment: (NOTE) The eGFR has been calculated using the CKD EPI equation. This calculation has not been validated in all clinical situations. eGFR's persistently <60 mL/min signify possible Chronic Kidney Disease.    Anion gap 9 5 - 15  CBC with Differential     Status: Abnormal   Collection Time: 11/15/15  7:10 AM  Result Value Ref Range   WBC 8.3 4.0 - 10.5 K/uL   RBC 4.66 4.22 - 5.81 MIL/uL   Hemoglobin 8.7 (L) 13.0 - 17.0 g/dL   HCT 29.4 (L) 39.0 - 52.0 %   MCV 63.1 (L) 78.0 - 100.0 fL   MCH 18.7 (L) 26.0 - 34.0 pg   MCHC 29.6 (L) 30.0 - 36.0 g/dL   RDW 17.2 (H) 11.5 - 15.5 %   Platelets 396 150 - 400 K/uL   Neutrophils Relative % 62 %   Lymphocytes Relative 27 %   Monocytes Relative 8 %   Eosinophils Relative 3 %   Basophils Relative 0 %   Neutro Abs 5.1 1.7 - 7.7 K/uL   Lymphs Abs 2.2 0.7 - 4.0 K/uL   Monocytes Absolute 0.7 0.1 - 1.0 K/uL   Eosinophils Absolute 0.3 0.0 - 0.7 K/uL   Basophils  Absolute 0.0 0.0 - 0.1 K/uL   RBC Morphology ELLIPTOCYTES   Urinalysis, Routine w reflex microscopic     Status: None   Collection Time: 11/15/15  7:58 AM  Result Value Ref Range   Color, Urine YELLOW YELLOW   APPearance CLEAR CLEAR   Specific Gravity, Urine 1.012 1.005 - 1.030   pH 6.0 5.0 - 8.0   Glucose, UA NEGATIVE NEGATIVE mg/dL   Hgb urine dipstick NEGATIVE NEGATIVE   Bilirubin Urine NEGATIVE NEGATIVE   Ketones, ur NEGATIVE NEGATIVE mg/dL   Protein, ur NEGATIVE NEGATIVE mg/dL   Nitrite NEGATIVE NEGATIVE   Leukocytes, UA NEGATIVE NEGATIVE    Comment: MICROSCOPIC NOT DONE ON URINES WITH NEGATIVE PROTEIN, BLOOD, LEUKOCYTES, NITRITE, OR GLUCOSE <1000 mg/dL.    Ct Abdomen Pelvis W Contrast  11/15/2015  CLINICAL DATA:  Left scrotal abscess EXAM: CT ABDOMEN AND PELVIS WITH CONTRAST TECHNIQUE: Multidetector CT imaging of the abdomen and pelvis was performed using the standard protocol following bolus administration of intravenous contrast. CONTRAST:  76m ISOVUE-300 IOPAMIDOL (ISOVUE-300) INJECTION 61% COMPARISON:  05/06/2015 FINDINGS: Lower chest:  Lung bases are unremarkable. Hepatobiliary: No focal hepatic mass. No calcified gallstones are noted within gallbladder. Pancreas: No mass, inflammatory changes, or other significant abnormality. Spleen: Within normal limits in size and appearance. Adrenals/Urinary Tract: No adrenal gland mass is noted. Enhanced kidneys are symmetrical in size. Left extrarenal pelvis again noted. No hydronephrosis or hydroureter. Mild distended urinary bladder without bladder filling defects or calculi. Stomach/Bowel: No gastric outlet obstruction. No thickened or dilated small bowel loops. No small bowel obstruction. No pericecal inflammation. Normal appendix is noted in axial image 59. Moderate stool noted in right colon transverse colon. Some colonic stool noted in descending colon. No distal colonic obstruction. No evidence of colitis or diverticulitis.  Vascular/Lymphatic: No aortic aneurysm. No retroperitoneal or mesenteric adenopathy. Mild enlarged right external iliac lymph node measures 1.2 cm in diameter without significant change from prior exam. Borderline left external iliac lymph node stable from prior exam. Right inguinal lymph node measures 1.2 cm short-axis probable reactive. Left inguinal lymph node measures 1.2 cm  short-axis probable reactive. Reproductive: Prostate gland and seminal vesicles are unremarkable. Other: Again noted significant skin thickening at gluteal crease/cleft bilaterally. There is also skin thickening along the perineum. Findings are consistent with extensive dermatitis or cellulitis. There is a superficial subcutaneous abscess axial image 85 in left medial gluteal region measures 1.6 cm. Somewhat elongated subcutaneous superficial fluid collection in left gluteal region axial image 98 measures 1.6 cm. There is a third confluent superficial fluid collection left gluteal region axial image 104 measures 3.2 cm. Findings are highly suspicious for multiple superficial abscesses. There is also skin thickening about the base of the scrotum without significant change from prior exam. There is thickening of scrotal wall and mild subcutaneous edema. There is small amount of fluid and subcutaneous edema anterior perineum base of the scrotum without definite evidence of a confluent scrotal abscess. There is also skin thickening in anterior pelvic wall just above the penis. Please see axial image 78. No definite evidence of perianal or perirectal abscess. Small superficial subcutaneous fluid collection right gluteal cleft medially axial image 105 and measures 2 cm suspicious for tiny superficial abscess. Musculoskeletal: No destructive bony lesions are noted. Sagittal images of the spine are unremarkable. IMPRESSION: 1. Again noted significant skin thickening at gluteal crease/cleft bilaterally. There is also skin thickening along the  perineum. Progression of skin thickening in left gluteal region. Findings are consistent with extensive dermatitis or cellulitis. There is a superficial subcutaneous abscess axial image 85 in left medial gluteal region measures 1.6 cm. Somewhat elongated subcutaneous superficial fluid collection in left gluteal region axial image 98 measures 1.6 cm. There is a third confluent superficial fluid collection left gluteal region axial image 104 measures 3.2 cm. Findings are highly suspicious for multiple superficial abscesses. There is also skin thickening about the base of the scrotum without significant change from prior exam. There is thickening of scrotal wall and mild subcutaneous edema. There is small amount of fluid and subcutaneous edema anterior perineum base of the scrotum without definite evidence of a confluent scrotal abscess. There is also skin thickening in anterior pelvic wall just above the penis. Please see axial image 78. No definite evidence of perianal or perirectal abscess. 2. Again noted bilateral external iliac and inguinal probable or reactive adenopathy. 3. No small bowel or colonic obstruction. No pericecal inflammation. Normal appendix. Moderate stool noted in right colon and transverse colon. 4. No hydronephrosis or hydroureter. Electronically Signed   By: Liviu  Pop M.D.   On: 11/15/2015 09:39    Review of Systems  Constitutional: Negative.        He has sites right axilla, both groins, left worse than right, an open site left scrotum, buttocks around his anus and a small area right thigh.  Some are draining and some are not.  HENT: Negative.   Eyes: Negative.   Respiratory: Negative.   Cardiovascular: Negative.   Gastrointestinal: Negative.   Genitourinary: Negative.   Musculoskeletal: Negative.   Skin: Negative.   Neurological: Negative.   Psychiatric/Behavioral: Negative.    Blood pressure 127/83, pulse 76, temperature 98.9 F (37.2 C), temperature source Oral, resp.  rate 18, SpO2 100 %. Physical Exam  Constitutional: He is oriented to person, place, and time. He appears well-developed and well-nourished. No distress.  HENT:  Head: Normocephalic.  Nose: Nose normal.  Eyes: Conjunctivae and EOM are normal. Right eye exhibits no discharge. Left eye exhibits no discharge. No scleral icterus.  Neck: Normal range of motion. Neck supple. No JVD present. No   tracheal deviation present. No thyromegaly present.  Cardiovascular: Normal rate, regular rhythm, normal heart sounds and intact distal pulses.   Respiratory: Effort normal and breath sounds normal.  GI: Soft. Bowel sounds are normal. He exhibits no distension. There is no tenderness. There is no rebound and no guarding.  Genitourinary:  He has lesions and open area left scrotum, about 5 cm long, areas on right scrotum also.  Both groins and buttocks around his rectum.    Musculoskeletal: He exhibits edema. He exhibits no tenderness.  Lymphadenopathy:    He has no cervical adenopathy.  Neurological: He is alert and oriented to person, place, and time. No cranial nerve deficit.  Skin: Skin is warm. He is not diaphoretic.  He has multiple lesions right axilla, left axilla is clear.  Both groins, left more than right, scrotum both sides, the one on the left is open and about 4-5 cm long, 7 mm wide and about 5 mm deep.  He has lesions both buttocks, around the rectum.  Small 1 cm lesion right mid thigh.    Psychiatric: He has a normal mood and affect. His behavior is normal. Judgment and thought content normal.    Assessment/Plan: Hidradenitis suppurativa, right axilla, groin, scrotum and buttocks  Plan:  Warm soaks, local wound care, with soap and water to keep sites clean.  Antibiotic treatment.   I will ask wound care nurse to see and advise on any ways to help with dressings, especially with his scrotum.  At this point nothing surgical to offer.    Dan Koch 11/15/2015, 12:06 PM

## 2015-11-15 NOTE — Progress Notes (Signed)
Patient arrived to unit, alert, oriented, requested woc consult, did not want rn to place temporary dressing. Oriented to room, unit, staff, will continue to monitor.

## 2015-11-15 NOTE — Consult Note (Signed)
WOC wound consult note Reason for Consult: hydradenitis; axilla and groin, buttocks Met with patient, he reports problems with hydradenitis since age 35. He recently had an I & D of and area on the scrotum in [place].  He has been carrying for the problem at home for many years.  The groin is worse he says. I have been asked to assist with drainage and any other options. I have explained to the patient that really topical care for hydradenitis is about drainage control and that there may be surgical options for surgeons who specialize in the treatment of this condition, otherwise there is no cure for the problem.   Wound type: open draining areas bilateral groin with tenderness and induration, purulent drainage. Buttocks and axilla not as bad.  Wound bed: I have visualized open areas in both groin and the surgical site on the left side of the scrotum Drainage (amount, consistency, odor) moderate to heavy, purulent, odor Periwound:maceration from moisture Dressing procedure/placement/frequency: We can try xeroform gauze in the groin for antibacterial effects and drying, however may not be able to keep in place for now I have suggested using mesh underwear.  I have discussed with patient use of maxipads for drainage control and he is optimistic about this option.  Continue packing I & D site, will order iodoform gauze 1/4" packing strip.  Patient has been independent with this care at home prior to admission, he was just out of packing strip. I have also suggested he research options for treatment online.    Discussed POC with patient and bedside nurse.  Re consult if needed, will not follow at this time. Thanks  Melody Austin RN, CWOCN ([phone])     

## 2015-11-15 NOTE — Progress Notes (Signed)
Scrotal dressing changed per order. Patient now resting in bed speaking on phone

## 2015-11-15 NOTE — ED Notes (Signed)
Pt. reports worsening abscess at left groin with drainage onset last week , denies fever or chills.

## 2015-11-16 ENCOUNTER — Encounter (HOSPITAL_COMMUNITY): Payer: Self-pay | Admitting: General Practice

## 2015-11-16 DIAGNOSIS — D509 Iron deficiency anemia, unspecified: Secondary | ICD-10-CM

## 2015-11-16 DIAGNOSIS — L732 Hidradenitis suppurativa: Principal | ICD-10-CM

## 2015-11-16 LAB — BASIC METABOLIC PANEL
Anion gap: 10 (ref 5–15)
BUN: 8 mg/dL (ref 6–20)
CHLORIDE: 103 mmol/L (ref 101–111)
CO2: 26 mmol/L (ref 22–32)
Calcium: 8.9 mg/dL (ref 8.9–10.3)
Creatinine, Ser: 1.02 mg/dL (ref 0.61–1.24)
GFR calc non Af Amer: >60 mL/min (ref >60)
Glucose, Bld: 103 mg/dL — ABNORMAL HIGH (ref 65–99)
POTASSIUM: 3.9 mmol/L (ref 3.5–5.1)
SODIUM: 139 mmol/L (ref 135–145)

## 2015-11-16 LAB — RETICULOCYTES
RBC.: 5.39 MIL/uL (ref 4.22–5.81)
RETIC COUNT ABSOLUTE: 53.9 10*3/uL (ref 19.0–186.0)
Retic Ct Pct: 1 % (ref 0.4–3.1)

## 2015-11-16 LAB — CBC
HEMATOCRIT: 33.8 % — AB (ref 39.0–52.0)
HEMOGLOBIN: 10.2 g/dL — AB (ref 13.0–17.0)
MCH: 18.9 pg — ABNORMAL LOW (ref 26.0–34.0)
MCHC: 30.2 g/dL (ref 30.0–36.0)
MCV: 62.7 fL — AB (ref 78.0–100.0)
Platelets: 488 10*3/uL — ABNORMAL HIGH (ref 150–400)
RBC: 5.39 MIL/uL (ref 4.22–5.81)
RDW: 17 % — ABNORMAL HIGH (ref 11.5–15.5)
WBC: 10.4 10*3/uL (ref 4.0–10.5)

## 2015-11-16 LAB — URINE CULTURE

## 2015-11-16 LAB — IRON AND TIBC
IRON: 19 ug/dL — AB (ref 45–182)
SATURATION RATIOS: 10 % — AB (ref 17.9–39.5)
TIBC: 188 ug/dL — AB (ref 250–450)
UIBC: 169 ug/dL

## 2015-11-16 LAB — HIV ANTIBODY (ROUTINE TESTING W REFLEX): HIV Screen 4th Generation wRfx: NONREACTIVE

## 2015-11-16 LAB — FERRITIN: Ferritin: 64 ng/mL (ref 24–336)

## 2015-11-16 MED ORDER — ACETAMINOPHEN 500 MG PO TABS
1000.0000 mg | ORAL_TABLET | Freq: Four times a day (QID) | ORAL | Status: DC
  Administered 2015-11-16 – 2015-11-17 (×6): 1000 mg via ORAL
  Filled 2015-11-16 (×6): qty 2

## 2015-11-16 MED ORDER — OXYCODONE HCL 5 MG PO TABS
5.0000 mg | ORAL_TABLET | ORAL | Status: DC | PRN
  Administered 2015-11-16 – 2015-11-17 (×6): 5 mg via ORAL
  Filled 2015-11-16 (×6): qty 1

## 2015-11-16 MED ORDER — IBUPROFEN 200 MG PO TABS
600.0000 mg | ORAL_TABLET | Freq: Four times a day (QID) | ORAL | Status: DC
  Administered 2015-11-16 – 2015-11-17 (×6): 600 mg via ORAL
  Filled 2015-11-16 (×6): qty 3

## 2015-11-16 MED ORDER — HYDROMORPHONE HCL 1 MG/ML IJ SOLN
0.2500 mg | INTRAMUSCULAR | Status: DC | PRN
Start: 2015-11-16 — End: 2015-11-17
  Administered 2015-11-16 – 2015-11-17 (×3): 0.25 mg via INTRAVENOUS
  Filled 2015-11-16 (×3): qty 1

## 2015-11-16 MED ORDER — ENOXAPARIN SODIUM 60 MG/0.6ML ~~LOC~~ SOLN
50.0000 mg | SUBCUTANEOUS | Status: DC
  Filled 2015-11-16 (×2): qty 0.5

## 2015-11-16 MED ORDER — FERROUS SULFATE 325 (65 FE) MG PO TABS
325.0000 mg | ORAL_TABLET | Freq: Every day | ORAL | Status: DC
  Administered 2015-11-16 – 2015-11-17 (×2): 325 mg via ORAL
  Filled 2015-11-16 (×2): qty 1

## 2015-11-16 MED ORDER — IBUPROFEN 200 MG PO TABS: 800.0000 mg | ORAL_TABLET | Freq: Four times a day (QID) | ORAL | Status: DC | PRN

## 2015-11-16 NOTE — Progress Notes (Signed)
Patient ID: Dan Koch, male   DOB: 07-30-1980, 35 y.o.   MRN: [id number]   Subjective: Dan Koch is the same as yesterday. He does not have any new complaints today. He's interested in following with [facility] once he is discharged. He tells [place] clindamycin and doxycyline have hardly helped his disease.  Objective: Vital signs in last 24 hours: Filed Vitals:   11/15/15 2041 11/16/15 0214 11/16/15 0500 11/16/15 0914  BP: 115/65 143/80 127/76 136/87  Pulse: 86 83 76 84  Temp: 98.8 F (37.1 C) 98.3 F (36.8 C) 97.4 F (36.3 C) 97.3 F (36.3 C)  TempSrc: Oral Oral Oral Oral  Resp: Weight:      SpO2: 100% 98% 96% 100%   General: resting in bed comfortably, appropriately conversational Cardiac: regular rate and rhythm, no rubs, murmurs or gallops Pulm: breathing well, clear to auscultation bilaterally Ext: warm and well perfused, without pedal edema Skin: There are extensive fluctuant nodules in his perineal and perigenital areas draining purulent fluid. His scrotum is markedly elevated to the size of a grapefruit draining purulent fluid. There are superficial sinuses on the shaft of his penis.  Lab Results: Basic Metabolic Panel:  Recent Labs Lab 11/15/15 0710 11/16/15 0210  NA 135 139  K 3.8 3.9  CL 100* 103  CO2 26 26  GLUCOSE 98 103*  BUN <5* 8  CREATININE 1.07 1.02  CALCIUM 8.7* 8.9    CBC:  Recent Labs Lab 11/15/15 0710 11/16/15 0210  WBC 8.3 10.4  NEUTROABS 5.1  --   HGB 8.7* 10.2*  HCT 29.4* 33.8*  MCV 63.1* 62.7*  PLT 396 488*   Anemia Panel:  Recent Labs Lab 11/16/15 0210  FERRITIN 64  TIBC 188*  IRON 19*  RETICCTPCT 1.0   Micro Results: Recent Results (from the past 240 hour(s))  Urine culture     Status: Abnormal   Collection Time: 11/15/15  7:58 AM  Result Value Ref Range Status   Specimen Description URINE, CLEAN CATCH  Final   Special Requests NONE  Final   Culture 2,000 COLONIES/mL  INSIGNIFICANT GROWTH (A)  Final   Report Status 11/16/2015 FINAL  Final  Wound culture     Status: None (Preliminary result)   Collection Time: 11/15/15  8:11 AM  Result Value Ref Range Status   Specimen Description WOUND LEFT BUTTOCKS  Final   Special Requests NONE  Final   Gram Stain   Final    ABUNDANT WBC PRESENT,BOTH PMN AND MONONUCLEAR NO SQUAMOUS EPITHELIAL CELLS SEEN ABUNDANT GRAM POSITIVE COCCI IN PAIRS Performed at [facility]    Culture PENDING  Incomplete   Report Status PENDING  Incomplete  Culture, routine-abscess     Status: None (Preliminary result)   Collection Time: 11/15/15  8:14 AM  Result Value Ref Range Status   Specimen Description ABSCESS LEFT SCROTUM  Final   Special Requests NONE  Final   Gram Stain   Final    ABUNDANT WBC PRESENT,BOTH PMN AND MONONUCLEAR NO SQUAMOUS EPITHELIAL CELLS SEEN ABUNDANT GRAM POSITIVE COCCI IN PAIRS IN CHAINS Performed at [facility]    Culture PENDING  Incomplete   Report Status PENDING  Incomplete   Medications: I have reviewed the patient's current medications. Scheduled Meds: . acetaminophen  1,000 mg Oral QID  . enoxaparin (LOVENOX) injection  50 mg Subcutaneous Q24H  . ferrous sulfate  325 mg Oral Q breakfast  . ibuprofen  600 mg  Oral QID  . pantoprazole  40 mg Oral QAC breakfast  . piperacillin-tazobactam (ZOSYN)  IV  3.375 g Intravenous Q8H  . vancomycin  1,250 mg Intravenous Q12H   Continuous Infusions:  PRN Meds:.HYDROmorphone (DILAUDID) injection, oxyCODONE   Assessment/Plan:  Dan Koch is a 35 year old man here with severe Hurley stage IV hidradenitis suppuritiva.  Hurley stage IV hidradenitis suppurativa: His wounds appears to be superinfected as evidenced by the extensive purulent drainage. We'll continue broad-spectrum antibiotics today and tomorrow and discharge him home on oral and topical clindamycin. [facility] June [facility] where he can  receive multi-disciplinary care. -Continue vancomycin and piperacillin-tazobactam -Follow wound cultures -Will discharge on oral and topical clindamycin -[facility]  Iron-deficiency anemia: I suspect this is related to his chronic wounds. We will inquire about family history of colorectal cancer tomorrow. I've started iron suppleme.tation. -Will inquire further about family history of CRC -Started ferrous sulfate 325mg  daily  Dispo: Discharge home tomorrow.  He does not have a PCP; he will follow in our clinic upon discharge and I'll get him an appointment.  The patient does not have transportation limitations that hinder transportation to clinic appointments.  .Services Needed at time of discharge: Y = Yes, Blank = No PT:   OT:   RN:   Equipment:   Other:     LOS: 1 day   Dan CooleyKyle Avrie Kedzierski, MD 11/16/2015, 4:48 PM

## 2015-11-16 NOTE — Progress Notes (Signed)
[facility] Progress Note     Subjective: Pt doing well.  He's concerned about the swelling of his scrotum/penis.  Ambulating well.  Tolerating diet  Objective: Vital signs in last 24 hours: Temp:  [97.3 F (36.3 C)-98.9 F (37.2 C)] 97.3 F (36.3 C) (04/26 0914) Pulse Rate:  [76-86] 84 (04/26 0914) Resp:  [16-20] 20 (04/26 0914) BP: (115-143)/(65-89) 136/87 mmHg (04/26 0914) SpO2:  [96 %-100 %] 100 % (04/26 0914) Weight:  [229 lb 15 oz (104.3 kg)] 229 lb 15 oz (104.3 kg) (04/25 1800) Last BM Date: 11/15/15  Intake/Output from previous day: 04/25 0701 - 04/26 0700 In: 1500 [I.V.:1500] Out: 400 [Urine:400] Intake/Output this shift:    PE: Gen:  Alert, NAD, pleasant Skin:  Multiple areas of chronic scarring and edema, multiple draining sinuses of buttock, groin, scrotum.  Drainage is purulent, but thin.  Skin is macerated.  Scrotal/penile swelling noted.  Lab Results:   Recent Labs  11/15/15 0710 11/16/15 0210  WBC 8.3 10.4  HGB 8.7* 10.2*  HCT 29.4* 33.8*  PLT 396 488*   BMET  Recent Labs  11/15/15 0710 11/16/15 0210  NA 135 139  K 3.8 3.9  CL 100* 103  CO2 26 26  GLUCOSE 98 103*  BUN <5* 8  CREATININE 1.07 1.02  CALCIUM 8.7* 8.9   PT/INR No results for input(s): LABPROT, INR in the last 72 hours. CMP     Component Value Date/Time   NA 139 11/16/2015 0210   K 3.9 11/16/2015 0210   CL 103 11/16/2015 0210   CO2 26 11/16/2015 0210   GLUCOSE 103* 11/16/2015 0210   BUN 8 11/16/2015 0210   CREATININE 1.02 11/16/2015 0210   CREATININE 0.84 04/12/2015 1219   CALCIUM 8.9 11/16/2015 0210   PROT 7.6 11/15/2015 0710   ALBUMIN 2.8* 11/15/2015 0710   AST 19 11/15/2015 0710   ALT 28 11/15/2015 0710   ALKPHOS 68 11/15/2015 0710   BILITOT 0.5 11/15/2015 0710   GFRNONAA >60 11/16/2015 0210   GFRAA >60 11/16/2015 0210   Lipase  No results found for: LIPASE     Studies/Results: Ct Abdomen Pelvis W Contrast  11/15/2015  CLINICAL DATA:   Left scrotal abscess EXAM: CT ABDOMEN AND PELVIS WITH CONTRAST TECHNIQUE: Multidetector CT imaging of the abdomen and pelvis was performed using the standard protocol following bolus administration of intravenous contrast. CONTRAST:  75mL ISOVUE-300 IOPAMIDOL (ISOVUE-300) INJECTION 61% COMPARISON:  05/06/2015 FINDINGS: Lower chest:  Lung bases are unremarkable. Hepatobiliary: No focal hepatic mass. No calcified gallstones are noted within gallbladder. Pancreas: No mass, inflammatory changes, or other significant abnormality. Spleen: Within normal limits in size and appearance. Adrenals/Urinary Tract: No adrenal gland mass is noted. Enhanced kidneys are symmetrical in size. Left extrarenal pelvis again noted. No hydronephrosis or hydroureter. Mild distended urinary bladder without bladder filling defects or calculi. Stomach/Bowel: No gastric outlet obstruction. No thickened or dilated small bowel loops. No small bowel obstruction. No pericecal inflammation. Normal appendix is noted in axial image 59. Moderate stool noted in right colon transverse colon. Some colonic stool noted in descending colon. No distal colonic obstruction. No evidence of colitis or diverticulitis. Vascular/Lymphatic: No aortic aneurysm. No retroperitoneal or mesenteric adenopathy. Mild enlarged right external iliac lymph node measures 1.2 cm in diameter without significant change from prior exam. Borderline left external iliac lymph node stable from prior exam. Right inguinal lymph node measures 1.2 cm short-axis probable reactive. Left inguinal lymph node measures 1.2 cm short-axis probable reactive. Reproductive:  Prostate gland and seminal vesicles are unremarkable. Other: Again noted significant skin thickening at gluteal crease/cleft bilaterally. There is also skin thickening along the perineum. Findings are consistent with extensive dermatitis or cellulitis. There is a superficial subcutaneous abscess axial image 85 in left medial gluteal  region measures 1.6 cm. Somewhat elongated subcutaneous superficial fluid collection in left gluteal region axial image 98 measures 1.6 cm. There is a third confluent superficial fluid collection left gluteal region axial image 104 measures 3.2 cm. Findings are highly suspicious for multiple superficial abscesses. There is also skin thickening about the base of the scrotum without significant change from prior exam. There is thickening of scrotal wall and mild subcutaneous edema. There is small amount of fluid and subcutaneous edema anterior perineum base of the scrotum without definite evidence of a confluent scrotal abscess. There is also skin thickening in anterior pelvic wall just above the penis. Please see axial image 78. No definite evidence of perianal or perirectal abscess. Small superficial subcutaneous fluid collection right gluteal cleft medially axial image 105 and measures 2 cm suspicious for tiny superficial abscess. Musculoskeletal: No destructive bony lesions are noted. Sagittal images of the spine are unremarkable. IMPRESSION: 1. Again noted significant skin thickening at gluteal crease/cleft bilaterally. There is also skin thickening along the perineum. Progression of skin thickening in left gluteal region. Findings are consistent with extensive dermatitis or cellulitis. There is a superficial subcutaneous abscess axial image 85 in left medial gluteal region measures 1.6 cm. Somewhat elongated subcutaneous superficial fluid collection in left gluteal region axial image 98 measures 1.6 cm. There is a third confluent superficial fluid collection left gluteal region axial image 104 measures 3.2 cm. Findings are highly suspicious for multiple superficial abscesses. There is also skin thickening about the base of the scrotum without significant change from prior exam. There is thickening of scrotal wall and mild subcutaneous edema. There is small amount of fluid and subcutaneous edema anterior perineum  base of the scrotum without definite evidence of a confluent scrotal abscess. There is also skin thickening in anterior pelvic wall just above the penis. Please see axial image 78. No definite evidence of perianal or perirectal abscess. 2. Again noted bilateral external iliac and inguinal probable or reactive adenopathy. 3. No small bowel or colonic obstruction. No pericecal inflammation. Normal appendix. Moderate stool noted in right colon and transverse colon. 4. No hydronephrosis or hydroureter. Electronically Signed   By: Natasha Mead M.D.   On: 11/15/2015 09:39    Anti-infectives: Anti-infectives    Start     Dose/Rate Route Frequency Ordered Stop   11/15/15 2330  piperacillin-tazobactam (ZOSYN) IVPB 3.375 g     3.375 g 12.5 mL/hr over 240 Minutes Intravenous Every 8 hours 11/15/15 1118     11/15/15 2030  vancomycin (VANCOCIN) 1,250 mg in sodium chloride 0.9 % 250 mL IVPB     1,250 mg 166.7 mL/hr over 90 Minutes Intravenous Every 12 hours 11/15/15 0807     11/15/15 1130  piperacillin-tazobactam (ZOSYN) IVPB 3.375 g     3.375 g 100 mL/hr over 30 Minutes Intravenous  Once 11/15/15 1118 11/15/15 1557   11/15/15 0800  vancomycin (VANCOCIN) 2,000 mg in sodium chloride 0.9 % 500 mL IVPB     2,000 mg 250 mL/hr over 120 Minutes Intravenous  Once 11/15/15 0749 11/15/15 1120       Assessment/Plan Hidradenitis suppurativa, right axilla, groin, scrotum and buttocks -Discussed antibiotic treatment.  No surgical treatment now.  I think part of his  problem was he was using bleach to clean his wounds and constantly trying to pop them open which is making everything worse.  He can get a referral to Dr. Magnus Ivan for consideration of excision, but his infections will have to be under better control before that can be a possibility.  He will also likely need to be followed by urology. -I discussed the basics of infection control, drainage control with dressings.  I discussed no ecessive scrubbing or cleaning  with bleach solution.  I discussed using gentle soap and water and dressing changes twice a day.  No popping/squeezing abscesses.  Use hot packs and soaks to allow them to open on their own.  We discussed washing clothing/towels in hot water.  Keep wounds open to the air at times to help them dry out. -Packing changes to the scrotum per urology    LOS: 1 day    Nonie Hoyer 11/16/2015, 10:33 AM Pager: [phone]  (7am - 4:30pm M-F; 7am - 11:30am Sa/Su)

## 2015-11-16 NOTE — Care Management Note (Signed)
Case Management Note  Patient Details  Name: Dan Koch MRN: [id number] Date of Birth: 06/09/1981  Subjective/Objective:                    Action/Plan: Patient was admitted with cellulitis. Lives at home with parents. Patient is currently listed as self-pay and is being followed by Harrold DonathNathan in [employer] [facility]. Will follow for discharge needs.  Expected Discharge Date:                  Expected Discharge Plan:     In-House Referral:     Discharge planning Services     Post Acute Care Choice:    Choice offered to:     DME Arranged:    DME Agency:     HH Arranged:    HH Agency:     Status of Service:  In process, will continue to follow  Medicare Important Message Given:    Date Medicare IM Given:    Medicare IM give by:    Date Additional Medicare IM Given:    Additional Medicare Important Message give by:     If discussed at Long Length of Stay Meetings, dates discussed:    Additional Comments:  [name], [name], RN 11/16/2015, 12:40 PM [phone]

## 2015-11-16 NOTE — Discharge Summary (Signed)
Name: Dan PalmsSamuel Baker Koch MRN: [id number] DOB: 01/29/1981 35 y.o. PCP: Jaclyn ShaggyEnobong Amao, MD  Date of Admission: 11/15/2015  6:16 AM Date of Discharge: 11/17/2015  Attending Physician: Earl LagosNischal Narendra, MD  Discharge Diagnosis: 1. Hurley stage IV hidradenitis suppurativa with superimposed infection 2. Iron-deficiency anemia  Discharge Medications:    Medication List    STOP taking these medications        clindamycin 1 % external solution  Commonly known as:  CLEOCIN-T  Replaced by:  CLEOCIN-T 1 % external solution     doxycycline 100 MG capsule  Commonly known as:  VIBRAMYCIN     famotidine 20 MG tablet  Commonly known as:  PEPCID     HYDROcodone-acetaminophen 5-325 MG tablet  Commonly known as:  NORCO/VICODIN     hydrOXYzine 25 MG tablet  Commonly known as:  ATARAX/VISTARIL     methocarbamol 500 MG tablet  Commonly known as:  ROBAXIN     mupirocin nasal ointment 2 %  Commonly known as:  BACTROBAN     ondansetron 4 MG tablet  Commonly known as:  ZOFRAN     oxyCODONE-acetaminophen 5-325 MG tablet  Commonly known as:  PERCOCET/ROXICET     tetracycline 250 MG capsule  Commonly known as:  ACHROMYCIN,SUMYCIN     traMADol 50 MG tablet  Commonly known as:  ULTRAM      TAKE these medications        chlorhexidine 4 % external liquid  Commonly known as:  HIBICLENS  Apply topically daily as needed.     CLEOCIN-T 1 % external solution  Generic drug:  clindamycin  Apply twice daily to affected areas. Dispense 240ml with 6 refills.     clindamycin 300 MG capsule  Commonly known as:  CLEOCIN  Take 1 capsule (300 mg total) by mouth 3 (three) times daily.     ferrous sulfate 325 (65 FE) MG tablet  Take 1 tablet (325 mg total) by mouth daily with breakfast.     ibuprofen 600 MG tablet  Commonly known as:  ADVIL,MOTRIN  Take 1 tablet (600 mg total) by mouth 4 (four) times daily.       Disposition and follow-up:   Mr.Dan Koch [facility] Good [facility].  At the hospital follow up visit please address:  1.  He has followed with Dr. Tammi Souhristopher Sayed at [facility] for his hidradenitis suppurativa  2. Follow-up wound cultures  Follow-up Appointments: Follow-up Information    Follow up with SAYED, Canary BrimHRISTOPHER J, MD. Go on 12/26/2015.   Specialty:  Dermatology   Why:  Follow-up with Dr. Jarome Matinhris Sayed at the [facility] ([address]; [place], [place], on the 2nd floor) on June 5 at [employer]:00am   Contact information:   [address] [id number] [place] [id number] [phone]       Follow up with Selina CooleyKyle Akiya Morr, MD. Go on 11/24/2015.   Specialty:  Internal Medicine   Why:  1:15PM   Contact information:   [address] [place] [address] [phone]      Consultations:  Treatment Team:  Md Montez Moritacs, MD Jethro BolusSigmund Tannenbaum, MD  Procedures Performed:  Ct Abdomen Pelvis W Contrast  11/15/2015  CLINICAL DATA:  Left scrotal abscess EXAM: CT ABDOMEN AND PELVIS WITH CONTRAST TECHNIQUE: Multidetector CT imaging of the abdomen and pelvis was performed using the standard protocol following bolus administration of intravenous contrast. CONTRAST:  75mL ISOVUE-300 IOPAMIDOL (ISOVUE-300) INJECTION 61% COMPARISON:  05/06/2015 FINDINGS: Lower chest:  Lung bases are unremarkable. Hepatobiliary: No focal hepatic mass. No calcified gallstones are noted within gallbladder. Pancreas: No mass, inflammatory changes, or other significant abnormality. Spleen: Within normal limits in size and appearance. Adrenals/Urinary Tract: No adrenal gland mass is noted. Enhanced kidneys are symmetrical in size. Left extrarenal pelvis again noted. No hydronephrosis or hydroureter. Mild distended urinary bladder without bladder filling defects or calculi. Stomach/Bowel: No gastric outlet obstruction. No thickened or dilated small bowel loops. No small bowel obstruction. No pericecal inflammation. Normal appendix is noted in  axial image 59. Moderate stool noted in right colon transverse colon. Some colonic stool noted in descending colon. No distal colonic obstruction. No evidence of colitis or diverticulitis. Vascular/Lymphatic: No aortic aneurysm. No retroperitoneal or mesenteric adenopathy. Mild enlarged right external iliac lymph node measures 1.2 cm in diameter without significant change from prior exam. Borderline left external iliac lymph node stable from prior exam. Right inguinal lymph node measures 1.2 cm short-axis probable reactive. Left inguinal lymph node measures 1.2 cm short-axis probable reactive. Reproductive: Prostate gland and seminal vesicles are unremarkable. Other: Again noted significant skin thickening at gluteal crease/cleft bilaterally. There is also skin thickening along the perineum. Findings are consistent with extensive dermatitis or cellulitis. There is a superficial subcutaneous abscess axial image 85 in left medial gluteal region measures 1.6 cm. Somewhat elongated subcutaneous superficial fluid collection in left gluteal region axial image 98 measures 1.6 cm. There is a third confluent superficial fluid collection left gluteal region axial image 104 measures 3.2 cm. Findings are highly suspicious for multiple superficial abscesses. There is also skin thickening about the base of the scrotum without significant change from prior exam. There is thickening of scrotal wall and mild subcutaneous edema. There is small amount of fluid and subcutaneous edema anterior perineum base of the scrotum without definite evidence of a confluent scrotal abscess. There is also skin thickening in anterior pelvic wall just above the penis. Please see axial image 78. No definite evidence of perianal or perirectal abscess. Small superficial subcutaneous fluid collection right gluteal cleft medially axial image 105 and measures 2 cm suspicious for tiny superficial abscess. Musculoskeletal: No destructive bony lesions are  noted. Sagittal images of the spine are unremarkable. IMPRESSION: 1. Again noted significant skin thickening at gluteal crease/cleft bilaterally. There is also skin thickening along the perineum. Progression of skin thickening in left gluteal region. Findings are consistent with extensive dermatitis or cellulitis. There is a superficial subcutaneous abscess axial image 85 in left medial gluteal region measures 1.6 cm. Somewhat elongated subcutaneous superficial fluid collection in left gluteal region axial image 98 measures 1.6 cm. There is a third confluent superficial fluid collection left gluteal region axial image 104 measures 3.2 cm. Findings are highly suspicious for multiple superficial abscesses. There is also skin thickening about the base of the scrotum without significant change from prior exam. There is thickening of scrotal wall and mild subcutaneous edema. There is small amount of fluid and subcutaneous edema anterior perineum base of the scrotum without definite evidence of a confluent scrotal abscess. There is also skin thickening in anterior pelvic wall just above the penis. Please see axial image 78. No definite evidence of perianal or perirectal abscess. 2. Again noted bilateral external iliac and inguinal probable or reactive adenopathy. 3. No small bowel or colonic obstruction. No pericecal inflammation. Normal appendix. Moderate stool noted in right colon and transverse colon. 4. No hydronephrosis or hydroureter. Electronically Signed   By: Natasha Mead  M.D.   On: 11/15/2015 09:39   Admission HPI: [name] is a 35 y.o. male w/ PMHx of hydradenitis suppuritiva, presents to the ED w/ complaints of worsening gluteal and scrotal pain and swelling. Patient states this has been an issue on his gluteal region for quite some time, starting with boils initially, then progressing to worsening pain and swelling. He has been seen in the ED multiple times for this, in his groin, gluteal  region, and left axilla. Has been treated with course of antibiotics in the past. 3 weeks ago, patient was traveling to see friends in [place] and says that the pain, swelling, and discharge in his right axilla, groin, and gluteal region was so severe he went to the ED. He apparently was hospitalized for a short time, had I&D in the right axilla and left gluteus. He was given Clindamycin per patient and stopped taking this as recently as 1-2 weeks ago. At no time did he say he experienced fever, chills, nausea, vomiting, decreased appetite, palpitations, or diarrhea. Also no SOB, chest pain, headache. Today, he states that his pain and swelling was too overwhelming and has progressively gotten worse over the past week. Continue to deny systemic symptoms. Patient denies any significant family history of similar issues, only significant for DM type II in his mother. Patient denies drinking alcohol, recreational drugs, or smoking. Not currently sexually active, no previous h/o STD's.   Hospital Course by problem list:   1. Hurley stage IV hidradenitis suppurativa with superimposed infection: He presented with numerous perianal, penile, and scrotal fluctuant nodules and sinus tracts with purulent drainage, along with clinical elephantiasis, from severe hidradenitis suppurativa. One month prior to admission, one of his scrotal abscesses was drained in [place] but has not yet healed and continued to drain purulent fluid. Given the history of marked increased purulence over the last several days, and extent of disease, we were concerned about superinfection and thus started him on vancomycin and piperacillin-tazobactam for three days with hopes of briefly sterilizing his wounds. Urology and general surgery evaluated him and did not feel he required surgical intervention this admission. He was discharged with clindamycin  twice daily, clindamycin 1% cream, and chlorhexidine wash, and he was referred to Dr.  Tammi Sou at [facility] for further evaluation and management of his severe disease.   2. Iron deficiency anemia: His hemoglobin on admission was 10, iron 20, and TIBC 188, consistent with iron deficiency anemia. He was discharged on ferrous sulfate  daily. He does not have a history of colorectal cancer.  Discharge Vitals:   BP 127/81 mmHg  Pulse 64  Temp(Src) 98.6 F (37 C) (Oral)  Resp 20  Wt 229 lb 15 oz (104.3 kg)  SpO2 100%  Discharge Labs:  No results found for this or any previous visit (from the past 24 hour(s)).  Signed: Selina Cooley, MD 11/17/2015, 12:56 PM

## 2015-11-17 LAB — CULTURE, ROUTINE-ABSCESS

## 2015-11-17 LAB — HEMOGLOBIN A1C
Hgb A1c MFr Bld: 6 % — ABNORMAL HIGH (ref 4.8–5.6)
MEAN PLASMA GLUCOSE: 126 mg/dL

## 2015-11-17 MED ORDER — CLEOCIN-T 1 % EX SOLN: CUTANEOUS | Status: AC

## 2015-11-17 MED ORDER — CLINDAMYCIN HCL 300 MG PO CAPS: 300.0000 mg | ORAL_CAPSULE | Freq: Three times a day (TID) | ORAL | Status: DC

## 2015-11-17 MED ORDER — FERROUS SULFATE 325 (65 FE) MG PO TABS: 325.0000 mg | ORAL_TABLET | Freq: Every day | ORAL | Status: AC

## 2015-11-17 MED ORDER — IBUPROFEN 600 MG PO TABS: 600.0000 mg | ORAL_TABLET | Freq: Four times a day (QID) | ORAL | Status: AC

## 2015-11-17 MED ORDER — CLINDAMYCIN PHOSPHATE 1 % EX SOLN: Freq: Two times a day (BID) | CUTANEOUS | Status: DC

## 2015-11-17 NOTE — Progress Notes (Signed)
Patient ID: Dan Koch, male   DOB: 05/01/1981, 35 y.o.   MRN: [id number]   Subjective: Dan Koch is feeling a little better today. His drainage has improved. He's been watching a lot of YouTube videos about HS and is feeling despondent about his future. We cheered him up and he's hopeful about meeting Dr. Janyth Koch.  Objective: Vital signs in last 24 hours: Filed Vitals:   11/16/15 2152 11/17/15 0143 11/17/15 0455 11/17/15 1012  BP: 128/76 127/78 125/78 127/81  Pulse: 75 72 60 64  Temp: 98.4 F (36.9 C) 97.9 F (36.6 C) 97.9 F (36.6 C) 98.6 F (37 C)  TempSrc: Oral Oral Oral Oral  Resp: 20 20 20 20   Weight:      SpO2: 100% 100% 100% 100%   General: resting in bed comfortably, appropriately conversational Cardiac: regular rate and rhythm, no rubs, murmurs or gallops Pulm: breathing well, clear to auscultation bilaterally Ext: warm and well perfused, without pedal edema Skin: There are extensive fluctuant nodules in his perineal and perigenital areas draining purulent fluid. His scrotum is markedly elevated to the size of a grapefruit draining purulent fluid. There are superficial sinuses on the shaft of his penis.  Medications: I have reviewed the patient's current medications. Scheduled Meds: . acetaminophen  1,000 mg Oral QID  . enoxaparin (LOVENOX) injection  50 mg Subcutaneous Q24H  . ferrous sulfate  325 mg Oral Q breakfast  . ibuprofen  600 mg Oral QID  . pantoprazole  40 mg Oral QAC breakfast  . piperacillin-tazobactam (ZOSYN)  IV  3.375 g Intravenous Q8H  . vancomycin  1,250 mg Intravenous Q12H   Continuous Infusions:  PRN Meds:.HYDROmorphone (DILAUDID) injection, oxyCODONE   Assessment/Plan:  Dan Koch is a 35 year old man here with severe Hurley stage IV hidradenitis suppuritiva.  Hurley stage IV hidradenitis suppurativa: We will discharge him home on oral and topical clindamycin. He has follow-[facility] with [facility] on June 3 where he can  receive multi-disciplinary care. -Follow wound cultures upon discharge -Will discharge on oral and topical clindamycin -He has [facility]  Iron-deficiency anemia: I suspect this is related to his chronic wounds. I've started iron supplementation. -Started ferrous sulfate 325mg  daily  Dispo: Disposition is deferred at this time, awaiting improvement of current medical problems.  Anticipated discharge in approximately 0 day(s).   The patient does have a current PCP (Dan ShaggyEnobong Amao, MD) and does need an [facility] follow-up appointment after discharge.  The patient does have transportation limitations that hinder transportation to clinic appointments.  .Services Needed at time of discharge: Y = Yes, Blank = No PT:   OT:   RN:   Equipment:   Other:     LOS: 2 days   Selina CooleyKyle Kenyette Gundy, MD 11/17/2015, 12:54 PM

## 2015-11-17 NOTE — Progress Notes (Signed)
Pt discharged at this time with no complaints.  Verbalizes understanding of discharge instructions and has prescription in hand.  Walked out with RN to car with mother.

## 2015-11-17 NOTE — Care Management Note (Signed)
Case Management Note  Patient Details  Name: Ranae PalmsSamuel Baker Allum MRN: [id number] Date of Birth: 05/10/1981  Subjective/Objective:                    Action/Plan: Patient is discharging home today, self-care.  MATCH program was provided to assist with the cost of patient's antibiotics.  Patient verbalized understanding of the program and is aware of the expiration date. Patient will follow with the [facility] for follow-up.  Bedside RN updated.  Expected Discharge Date:                  Expected Discharge Plan:  Home/Self Care  In-House Referral:     Discharge planning Services  CM Consult, Medication Assistance, MATCH Program  Post Acute Care Choice:    Choice offered to:     DME Arranged:    DME Agency:     HH Arranged:    HH Agency:     Status of Service:  Completed, signed off  Medicare Important Message Given:    Date Medicare IM Given:    Medicare IM give by:    Date Additional Medicare IM Given:    Additional Medicare Important Message give by:     If discussed at Long Length of Stay Meetings, dates discussed:    Additional Comments:  [name], Tyresse Jayson C, RN 11/17/2015, 12:10 PM [phone]

## 2015-11-18 LAB — WOUND CULTURE: CULTURE: NORMAL

## 2015-11-23 MED FILL — CLINDAMYCIN PH 1% SOLUTION: 1 | 15 days supply | Qty: 30 | Fill #1

## 2015-11-24 ENCOUNTER — Ambulatory Visit: Payer: Self-pay | Admitting: Internal Medicine

## 2015-12-05 ENCOUNTER — Encounter (HOSPITAL_COMMUNITY): Payer: Self-pay | Admitting: Emergency Medicine

## 2015-12-05 ENCOUNTER — Emergency Department (HOSPITAL_COMMUNITY)
Admission: EM | Admit: 2015-12-05 | Discharge: 2015-12-05 | Disposition: A | Discharge status: Home / Self Care | Payer: Medicaid Other | Source: Home / Self Care | Attending: Emergency Medicine | Admitting: Emergency Medicine

## 2015-12-05 ENCOUNTER — Inpatient Hospital Stay (HOSPITAL_COMMUNITY)
Admission: AD | Admit: 2015-12-05 | Discharge: 2015-12-09 | DRG: 581 | Disposition: A | Discharge status: Home Health | Payer: Medicaid Other | Source: Ambulatory Visit | Attending: Internal Medicine | Admitting: Internal Medicine

## 2015-12-05 ENCOUNTER — Emergency Department (HOSPITAL_COMMUNITY): Payer: Medicaid Other

## 2015-12-05 DIAGNOSIS — D638 Anemia in other chronic diseases classified elsewhere: Secondary | ICD-10-CM | POA: Diagnosis present

## 2015-12-05 DIAGNOSIS — N4889 Other specified disorders of penis: Secondary | ICD-10-CM

## 2015-12-05 DIAGNOSIS — L732 Hidradenitis suppurativa: Secondary | ICD-10-CM

## 2015-12-05 DIAGNOSIS — B9689 Other specified bacterial agents as the cause of diseases classified elsewhere: Secondary | ICD-10-CM

## 2015-12-05 DIAGNOSIS — L0231 Cutaneous abscess of buttock: Principal | ICD-10-CM | POA: Diagnosis present

## 2015-12-05 DIAGNOSIS — Z888 Allergy status to other drugs, medicaments and biological substances status: Secondary | ICD-10-CM

## 2015-12-05 DIAGNOSIS — Z91018 Allergy to other foods: Secondary | ICD-10-CM | POA: Diagnosis not present

## 2015-12-05 DIAGNOSIS — D509 Iron deficiency anemia, unspecified: Secondary | ICD-10-CM | POA: Diagnosis present

## 2015-12-05 DIAGNOSIS — Z792 Long term (current) use of antibiotics: Secondary | ICD-10-CM

## 2015-12-05 DIAGNOSIS — D649 Anemia, unspecified: Secondary | ICD-10-CM

## 2015-12-05 DIAGNOSIS — N492 Inflammatory disorders of scrotum: Secondary | ICD-10-CM | POA: Diagnosis present

## 2015-12-05 DIAGNOSIS — Z79899 Other long term (current) drug therapy: Secondary | ICD-10-CM | POA: Insufficient documentation

## 2015-12-05 DIAGNOSIS — N5089 Other specified disorders of the male genital organs: Secondary | ICD-10-CM | POA: Diagnosis present

## 2015-12-05 DIAGNOSIS — Z791 Long term (current) use of non-steroidal anti-inflammatories (NSAID): Secondary | ICD-10-CM

## 2015-12-05 DIAGNOSIS — S41101A Unspecified open wound of right upper arm, initial encounter: Secondary | ICD-10-CM

## 2015-12-05 DIAGNOSIS — X58XXXA Exposure to other specified factors, initial encounter: Secondary | ICD-10-CM

## 2015-12-05 DIAGNOSIS — S3130XA Unspecified open wound of scrotum and testes, initial encounter: Secondary | ICD-10-CM

## 2015-12-05 LAB — BASIC METABOLIC PANEL
ANION GAP: 8 (ref 5–15)
BUN: 7 mg/dL (ref 6–20)
CALCIUM: 9.1 mg/dL (ref 8.9–10.3)
CO2: 26 mmol/L (ref 22–32)
Chloride: 104 mmol/L (ref 101–111)
Creatinine, Ser: 1.1 mg/dL (ref 0.61–1.24)
GFR calc Af Amer: >60 mL/min (ref >60)
GLUCOSE: 93 mg/dL (ref 65–99)
Potassium: 4.2 mmol/L (ref 3.5–5.1)
SODIUM: 138 mmol/L (ref 135–145)

## 2015-12-05 LAB — CBC WITH DIFFERENTIAL/PLATELET
BASOS ABS: 0 10*3/uL (ref 0.0–0.1)
Basophils Relative: 0 %
EOS ABS: 0.2 10*3/uL (ref 0.0–0.7)
Eosinophils Relative: 2 %
HCT: 33.3 % — ABNORMAL LOW (ref 39.0–52.0)
Hemoglobin: 10.1 g/dL — ABNORMAL LOW (ref 13.0–17.0)
LYMPHS ABS: 1.8 10*3/uL (ref 0.7–4.0)
LYMPHS PCT: 17 %
MCH: 19.5 pg — ABNORMAL LOW (ref 26.0–34.0)
MCHC: 30.3 g/dL (ref 30.0–36.0)
MCV: 64.2 fL — ABNORMAL LOW (ref 78.0–100.0)
Monocytes Absolute: 0.9 10*3/uL (ref 0.1–1.0)
Monocytes Relative: 8 %
NEUTROS ABS: 7.8 10*3/uL — AB (ref 1.7–7.7)
Neutrophils Relative %: 73 %
Platelets: 374 10*3/uL (ref 150–400)
RBC: 5.19 MIL/uL (ref 4.22–5.81)
RDW: 17.9 % — AB (ref 11.5–15.5)
WBC: 10.7 10*3/uL — AB (ref 4.0–10.5)

## 2015-12-05 MED ORDER — OXYCODONE HCL 5 MG PO TABS
5.0000 mg | ORAL_TABLET | Freq: Once | ORAL | Status: AC
Start: 2015-12-05 — End: 2015-12-05
  Administered 2015-12-05: 5 mg via ORAL
  Filled 2015-12-05: qty 1

## 2015-12-05 MED ORDER — SODIUM CHLORIDE 0.9 % IV BOLUS (SEPSIS)
1000.0000 mL | Freq: Once | INTRAVENOUS | Status: AC
  Administered 2015-12-05: 1000 mL via INTRAVENOUS

## 2015-12-05 MED ORDER — HYDROMORPHONE HCL 1 MG/ML IJ SOLN
1.0000 mg | Freq: Once | INTRAMUSCULAR | Status: AC
  Administered 2015-12-05: 1 mg via INTRAVENOUS
  Filled 2015-12-05: qty 1

## 2015-12-05 MED ORDER — OXYCODONE-ACETAMINOPHEN 5-325 MG PO TABS
1.0000 | ORAL_TABLET | Freq: Once | ORAL | Status: AC
  Administered 2015-12-05: 1 via ORAL
  Filled 2015-12-05: qty 1

## 2015-12-05 MED ORDER — PIPERACILLIN-TAZOBACTAM 3.375 G IVPB 30 MIN
3.3750 g | Freq: Once | INTRAVENOUS | Status: AC
  Administered 2015-12-05: 3.375 g via INTRAVENOUS
  Filled 2015-12-05: qty 50

## 2015-12-05 MED ORDER — VANCOMYCIN HCL IN DEXTROSE 1-5 GM/200ML-% IV SOLN
1000.0000 mg | Freq: Once | INTRAVENOUS | Status: AC
Start: 2015-12-05 — End: 2015-12-05
  Administered 2015-12-05: 1000 mg via INTRAVENOUS
  Filled 2015-12-05: qty 200

## 2015-12-05 MED ORDER — OXYCODONE HCL 5 MG PO TABS
10.0000 mg | ORAL_TABLET | Freq: Once | ORAL | Status: AC
  Administered 2015-12-05: 10 mg via ORAL
  Filled 2015-12-05: qty 2

## 2015-12-05 MED ORDER — IOPAMIDOL (ISOVUE-300) INJECTION 61%
INTRAVENOUS | Status: AC
  Administered 2015-12-05: 100 mL
  Filled 2015-12-05: qty 100

## 2015-12-05 MED ORDER — CLINDAMYCIN PHOSPHATE 600 MG/50ML IV SOLN
600.0000 mg | Freq: Once | INTRAVENOUS | Status: AC
  Administered 2015-12-05: 600 mg via INTRAVENOUS
  Filled 2015-12-05: qty 50

## 2015-12-05 NOTE — ED Provider Notes (Signed)
CSN: [id number]     Arrival date & time 12/05/15  1043 History   First MD Initiated Contact with Patient 12/05/15 1123     Chief Complaint  Patient presents with  . Wound Check     (Consider location/radiation/quality/duration/timing/severity/associated sxs/prior Treatment) The history is provided by the patient and medical records.     Pt with hx hydradenitis suppurativa recent hospitalized 4/25-4/27/17 for multiple draining abscesses and d/c home on clindamycin TID p/w acute worsening of his pain and swelling of the groin, increased pain of the left buttock. Symptoms were stable since hospitalization until last night when he noticed increased swelling and pain with even greater increase in swelling of the scrotum and new swelling of the penis that began this morning.  Has been sweating at home.  Unsure of recent temperatures but denies shaking chills or myalgias.  He is able to urinate.  Denies abnormal penile discharge, N/V, bowel changes.  Has been taking clindamycin TID since discharge from hospital until yesterday when he slept all day and only took one dose.    Past Medical History  Diagnosis Date  . Abscess   . Acne inversa     Hydradenitis suppurativa  . Hydradenitis    Past Surgical History  Procedure Laterality Date  . Hernia repair    . Incision and drainage     History reviewed. No pertinent family history. Social History  Substance Use Topics  . Smoking status: Never Smoker   . Smokeless tobacco: Never Used  . Alcohol Use: No    Review of Systems  All other systems reviewed and are negative.     Allergies  Strawberry extract; Tomato; and Tramadol  Home Medications   Prior to Admission medications   Medication Sig Start Date End Date Taking? Authorizing Provider  chlorhexidine (HIBICLENS) 4 % external liquid Apply topically daily as needed. Patient not taking: Reported on 11/15/2015 07/20/15   Leta Baptist, MD  CLEOCIN-T 1 % external solution Apply  twice daily to affected areas. Dispense with 6 refills. 11/17/15   Selina Cooley, MD  clindamycin (CLEOCIN) 300 MG capsule Take 1 capsule (300 mg total) by mouth 3 (three) times daily. 11/17/15   Selina Cooley, MD  ferrous sulfate 325 (65 FE) MG tablet Take 1 tablet (325 mg total) by mouth daily with breakfast. 11/17/15   Beather Arbour, MD  ibuprofen (ADVIL,MOTRIN) 600 MG tablet Take 1 tablet (600 mg total) by mouth 4 (four) times daily. 11/17/15   Selina Cooley, MD   BP 130/88 mmHg  Pulse 98  Temp(Src) 98.6 F (37 C) (Oral)  Resp 18  SpO2 100% Physical Exam  Constitutional: He appears well-developed and well-nourished. No distress.  HENT:  Head: Normocephalic and atraumatic.  Neck: Neck supple.  Cardiovascular: Normal rate and regular rhythm.   Pulmonary/Chest: Effort normal and breath sounds normal. No respiratory distress. He has no wheezes. He has no rales.  Abdominal: Soft. He exhibits no distension and no mass. There is no tenderness. There is no rebound and no guarding.  Genitourinary:  Large edema of scrotum with tenderness to palpation.  Focal induration over right scrotum.  No fluctuance.  Left scrotum with two surgical incisions.  Mild light/white drainage from larger more anterior incision.  Posterior incision is closed.   Penis is edematous and tender to palpation.  Ventral penis appears more edematous than dorsal aspect.  No focal fluctuance or induration.    Neurological: He is alert. He exhibits normal muscle tone.  Skin: He is not diaphoretic.  Axilla and groin with multiple tender draining areas.   Left buttock with large area of induration with purulent drainage, tender to palpation.  Please see GU exam    Nursing note and vitals reviewed.      ED Course  Procedures (including critical care time) Labs Review Labs Reviewed  CBC WITH DIFFERENTIAL/PLATELET - Abnormal; Notable for the following:    WBC 10.7 (*)    Hemoglobin 10.1 (*)    HCT 33.3 (*)    MCV 64.2  (*)    MCH 19.5 (*)    RDW 17.9 (*)    Neutro Abs 7.8 (*)    All other components within normal limits  BASIC METABOLIC PANEL    Imaging Review Ct Pelvis W Contrast  12/05/2015  CLINICAL DATA:  Scrotal swelling and penile pain, initial encounter EXAM: CT PELVIS WITH CONTRAST TECHNIQUE: Multidetector CT imaging of the pelvis was performed using the standard protocol following the bolus administration of intravenous contrast. CONTRAST:  100 mL ISOVUE-300 IOPAMIDOL (ISOVUE-300) INJECTION 61% COMPARISON:  11/15/2015 FINDINGS: Bladder is well distended. The intrapelvic structures are within normal limits. There is again noted thickening of the gluteal cleft particularly on the left with extension into the left buttocks. Some small fluid collections are noted in this region but a dominant collection is noted inferiorly which measures 6.7 cm in greatest dimension. This is an increase from the prior exam a which time it measured 4.1 cm in greatest dimension. Diffuse small subcutaneous fluid collections are noted on the left relatively stable from the prior exam. Some mild scrotal edema is seen. Some soft tissue swelling is noted along the left inguinal region as well. This is stable from the prior exam. Reactive lymphadenopathy is noted in the inguinal regions bilaterally relatively stable from the prior study. No acute bony abnormality is noted. IMPRESSION: The overall appearance of the skin thickening and subcutaneous fluid collections is stable with the exception of the most inferior/dominant collections along the inferomedial left buttock. It has increased in size as described. Electronically Signed   By: Alcide CleverMark  Lukens M.D.   On: 12/05/2015 14:50   I have personally reviewed and evaluated these images and lab results as part of my medical decision-making.   EKG Interpretation None      MDM   Final diagnoses:  Hidradenitis suppurativa  Cellulitis of scrotum  Swelling of penis    Afebrile  nontoxic patient with hx hidradenitis suppurativa with recent admission for multiple infections of scrotum, buttock, axilla presents today with sudden increase in pain and swelling that began last night.  He has new swelling involving his penis.  Recent admission 4/25-4/27 was admitted to Internal Medicine Teaching Service with consult by urology and general surgery but without need for surgical intervention.  Labs significant for mild leukocytosis, anemia (though improved from prior testing).  CT shows stable disease generally but with increase in size of left buttock abscess.  Pt was also seen and examined by ED attending Dr Clayborne DanaMesner who recommended vancomycin, zosyn, and clindamycin given sudden increase in penis and scrotal swelling, concerning for worsening infection that is a risk for developing fournier's gangrene.  Admitted to Internal Medicine Teaching Service.      Trixie Dredgemily Rabia Argote, PA-C 12/05/15 1549  Marily MemosJason Mesner, MD 12/05/15 [phone]

## 2015-12-05 NOTE — ED Notes (Signed)
Pt sts swelling and possible abscess to penis with pain

## 2015-12-05 NOTE — H&P (Signed)
Date: 12/06/2015               Patient Name:  Dan Koch MRN: [id number]  DOB: 02/06/1981 Age / Sex: 35 y.o., male   PCP: Jaclyn ShaggyEnobong Amao, MD         Medical Service: Internal Medicine Teaching Service         Attending Physician: Dr. Inez CatalinaEmily B Mullen, MD    First Contact: Dr. Karma GreaserBoswell Pager: [phone]  Second Contact: Dr. Tasia CatchingsAhmed Pager: [phone]       After Hours (After 5p/  First Contact Pager: [phone]  weekends / holidays): Second Contact Pager: [phone]   Chief Complaint: Infection in axilla and groin  History of Present Illness: 35 y/o man with hidradenitis suppurativa since teenage years affecting his groin and R axilla who was discharged from this hospital on 4/27 on clindamycin for cellulitis of the affected areas now presents with worsening pain and swelling of the scrotum, penis, and left gluteal fold since last night. He noticed corresponding increase in drainage some serous some purulent from these areas. This pain was previously stable every day since his hospitalization. He has been feeling sweaty at home but denies chills or shivering and did not check his temperature. He denies any nausea, vomiting, abdominal pain or dysuria. [place] 6 [place] I&D of his R axilla and L gluteal abscesses.  Of note he was recommended to [facility] up [facility] on [facility]/5 which has not happened yet. He was also recommended to [facility] which he reports missing due to car trouble and taking care of his son(?)  Meds: Current Facility-Administered Medications  Medication Dose Route Frequency Provider Last Rate Last Dose  . enoxaparin (LOVENOX) injection 40 mg  40 mg Subcutaneous Daily Fuller Planhristopher W Rice, MD      . oxyCODONE-acetaminophen (PERCOCET/ROXICET) 5-325 MG per tablet 2 tablet  2 tablet Oral Q6H PRN Fuller Planhristopher W Rice, MD      . piperacillin-tazobactam (ZOSYN) IVPB 3.375 g  3.375 g Intravenous Q8H Fuller Planhristopher W  Rice, MD      . vancomycin (VANCOCIN) IVPB 1000 mg/200 mL premix  1,000 mg Intravenous Q8H Inez CatalinaEmily B Mullen, MD        Allergies: Allergies as of 12/05/2015 - Review Complete 12/05/2015  Allergen Reaction Noted  . Strawberry extract Swelling 09/15/2014  . Tomato Swelling 09/15/2014  . Citrus Swelling 12/05/2015  . Tramadol Other (See Comments) 05/06/2015   Past Medical History  Diagnosis Date  . Abscess   . Acne inversa     Hydradenitis suppurativa  . Hydradenitis    Past Surgical History  Procedure Laterality Date  . Hernia repair    . Incision and drainage     No family history on file. Social History   Social History  . Marital Status: Single    Spouse Name: N/A  . Number of Children: N/A  . Years of Education: N/A   Occupational History  . Not on file.   Social History Main Topics  . Smoking status: Never Smoker   . Smokeless tobacco: Never Used  . Alcohol Use: No  . Drug Use: No  . Sexual Activity: Not Currently   Other Topics Concern  . Not on file   Social History Narrative    Review of Systems: Review of Systems  Constitutional: Negative for chills.  Eyes: Negative for blurred vision.  Respiratory: Negative for shortness of breath.  Cardiovascular: Negative for chest pain.  Gastrointestinal: Negative for nausea and abdominal pain.  Genitourinary: Negative for dysuria and hematuria.  Musculoskeletal: Negative for myalgias.  Skin:       Positive for painful lesions in R axilla, groin, L buttocks, and scrotum with drainage.  Neurological: Negative for dizziness and headaches.  Endo/Heme/Allergies: Does not bruise/bleed easily.  Psychiatric/Behavioral: The patient does not have insomnia.     Physical Exam: Blood pressure 111/68, pulse 95, temperature 99.8 F (37.7 C), temperature source Oral, resp. rate 18, SpO2 100 %. GENERAL- alert, co-operative, NAD HEENT- Healing lesions on face R>L, no cervical LN enlargement. AXILLA: Right axilla with  multiple indurated lesions draining and easily expressed serous/purulent appearing fluid CARDIAC- RRR, no murmurs, rubs or gallops. RESP- CTAB, no wheezes or crackles. ABDOMEN- Soft, nontender, no guarding or rebound GENITOURINARY- Large serous draining lesions on scrotum largest ~4cm length on upper left, L gluteal fold with large indurated and fluctuant lesion with purulent drainage, significant induration diffusely along scrotum particularly R lower area NEURO- Strength upper and lower extremities- 5/5, Sensation intact globally EXTREMITIES- symmetric, no pedal edema. SKIN- extensive lesions as described above PSYCH- Normal mood and affect, appropriate thought content and speech.   Lab results: Basic Metabolic Panel:  Recent Labs  40/98/11 1155  NA 138  K 4.2  CL 104  CO2 26  GLUCOSE 93  BUN 7  CREATININE 1.10  CALCIUM 9.1   Liver Function Tests: No results for input(s): AST, ALT, ALKPHOS, BILITOT, PROT, ALBUMIN in the last 72 hours. No results for input(s): LIPASE, AMYLASE in the last 72 hours. No results for input(s): AMMONIA in the last 72 hours. CBC:  Recent Labs  12/05/15 1155  WBC 10.7*  NEUTROABS 7.8*  HGB 10.1*  HCT 33.3*  MCV 64.2*  PLT 374   Cardiac Enzymes: No results for input(s): CKTOTAL, CKMB, CKMBINDEX, TROPONINI in the last 72 hours. BNP: No results for input(s): PROBNP in the last 72 hours. D-Dimer: No results for input(s): DDIMER in the last 72 hours. CBG: No results for input(s): GLUCAP in the last 72 hours. Hemoglobin A1C: No results for input(s): HGBA1C in the last 72 hours. Fasting Lipid Panel: No results for input(s): CHOL, HDL, LDLCALC, TRIG, CHOLHDL, LDLDIRECT in the last 72 hours. Thyroid Function Tests: No results for input(s): TSH, T4TOTAL, FREET4, T3FREE, THYROIDAB in the last 72 hours. Anemia Panel: No results for input(s): VITAMINB12, FOLATE, FERRITIN, TIBC, IRON, RETICCTPCT in the last 72 hours. Coagulation: No results for  input(s): LABPROT, INR in the last 72 hours. Urine Drug Screen: Drugs of Abuse  No results found for: LABOPIA, COCAINSCRNUR, LABBENZ, AMPHETMU, THCU, LABBARB  Alcohol Level: No results for input(s): ETH in the last 72 hours. Urinalysis: No results for input(s): COLORURINE, LABSPEC, PHURINE, GLUCOSEU, HGBUR, BILIRUBINUR, KETONESUR, PROTEINUR, UROBILINOGEN, NITRITE, LEUKOCYTESUR in the last 72 hours.  Invalid input(s): APPERANCEUR   Imaging results:  Ct Pelvis W Contrast  12/05/2015  CLINICAL DATA:  Scrotal swelling and penile pain, initial encounter EXAM: CT PELVIS WITH CONTRAST TECHNIQUE: Multidetector CT imaging of the pelvis was performed using the standard protocol following the bolus administration of intravenous contrast. CONTRAST:  100 mL ISOVUE-300 IOPAMIDOL (ISOVUE-300) INJECTION 61% COMPARISON:  11/15/2015 FINDINGS: Bladder is well distended. The intrapelvic structures are within normal limits. There is again noted thickening of the gluteal cleft particularly on the left with extension into the left buttocks. Some small fluid collections are noted in this region but a dominant collection is noted inferiorly which measures  6.7 cm in greatest dimension. This is an increase from the prior exam a which time it measured 4.1 cm in greatest dimension. Diffuse small subcutaneous fluid collections are noted on the left relatively stable from the prior exam. Some mild scrotal edema is seen. Some soft tissue swelling is noted along the left inguinal region as well. This is stable from the prior exam. Reactive lymphadenopathy is noted in the inguinal regions bilaterally relatively stable from the prior study. No acute bony abnormality is noted. IMPRESSION: The overall appearance of the skin thickening and subcutaneous fluid collections is stable with the exception of the most inferior/dominant collections along the inferomedial left buttock. It has increased in size as described. Electronically Signed    By: Alcide Clever M.D.   On: 12/05/2015 14:50   Assessment & Plan by Problem: Gluteal abscess / worsening hidradenitis suppurativa: Left gluteal lesion increased in size compared to at recent evaluation, draining purulent appearing contents, and very painful on examination. He may have developed a more significant infection of this chronic process. Increased pain and drainage somewhat more prominent than at other sites. CT demonstrating previously known small subcutaneous edema and scrotal edema, with L gluteal cleft progressive compared to prior study. He has been on clinda for weeks with stable symptoms. Derm appointment remains pending in June and [facility] follow up was missed 1.5 weeks ago. -Vancomycin 5/15>>> -Zosyn 5/15>>> -Superficial wound Cx obtained -Pain control PO oxycodone -Repeat AM CBC, Bmet -Surgical evaluation in AM  Anemia: Hgb 10.1, improved from previous admission with 8.7. Felt to be iron deficiency anemia at that time with MCV 63. Total iron was low but ferritin and TIBC not that abnormal. He was discharged with prescription for iron supplementation 325mg  daily but did not take this and was not aware of it.  FULL CODE Diet: Regular VTE ppx: [employer] enoxaparin  Dispo: Disposition is deferred at this time, awaiting improvement of current medical problems. Anticipated discharge in approximately 2-4 day(s).   The patient does have a current PCP (Jaclyn Shaggy, MD) and does need an [facility] hospital follow-up appointment after discharge. The patient does not know have transportation limitations that hinder transportation to clinic appointments.  The patient recently missed an appointment with Dr. Earnest Conroy on 11/24/15. He may benefit in particular from a new appointment with Dr. Earnest Conroy scheduled at time of discharge.  Signed: Fuller Plan, MD 12/06/2015, 1:21 AM

## 2015-12-05 NOTE — Discharge Instructions (Signed)
You are leaving against our medical advice. You need to be admitted to the hospital as soon as possible. Please return to the second floor [facility] As Possible for Direct Admission to the Internal Medicine Team for More Antibiotics and Further Evaluation.   Hidradenitis Suppurativa Hidradenitis suppurativa is a long-term (chronic) skin disease that starts with blocked sweat glands or hair follicles. Bacteria may grow in these blocked openings of your skin. Hidradenitis suppurativa is like a severe form of acne that develops in areas of your body where acne would be unusual. It is most likely to affect the areas of your body where skin rubs against skin and becomes moist. This includes your:  Underarms.  Groin.  Genital areas.  Buttocks.  Upper thighs.  Breasts. Hidradenitis suppurativa may start out with small pimples. The pimples can develop into deep sores that break open (rupture) and drain pus. Over time your skin may thicken and become scarred. Hidradenitis suppurativa cannot be passed from person to person.  CAUSES  The exact cause of hidradenitis suppurativa is not known. This condition may be due to:  Male and male hormones. The condition is rare before and after puberty.  An overactive body defense system (immune system). Your immune system may overreact to the blocked hair follicles or sweat glands and cause swelling and pus-filled sores. RISK FACTORS You may have a higher risk of hidradenitis suppurativa if you:  Are a woman.  Are between ages 38 and 37.  Have a family history of hidradenitis suppurativa.  Have a personal history of acne.  Are overweight.  Smoke.  Take the drug lithium. SIGNS AND SYMPTOMS  The first signs of an outbreak are usually painful skin bumps that look like pimples. As the condition progresses:  Skin bumps may get bigger and grow deeper into the skin.  Bumps under the skin may rupture and drain smelly  pus.  Skin may become itchy and infected.  Skin may thicken and scar.  Drainage may continue through tunnels under the skin (fistulas).  Walking and moving your arms can become painful. DIAGNOSIS  Your health care provider may diagnose hidradenitis suppurativa based on your medical history and your signs and symptoms. A physical exam will also be done. You may need to see a health care provider who specializes in skin diseases (dermatologist). You may also have tests done to confirm the diagnosis. These can include:  Swabbing a sample of pus or drainage from your skin so it can be sent to the lab and tested for infection.  Blood tests to check for infection. TREATMENT  The same treatment will not work for everybody with hidradenitis suppurativa. Your treatment will depend on how severe your symptoms are. You may need to try several treatments to find what works best for you. Part of your treatment may include cleaning and bandaging (dressing) your wounds. You may also have to take medicines, such as the following:  Antibiotics.  Acne medicines.  Medicines to block or suppress the immune system.  A diabetes medicine (metformin) is sometimes used to treat this condition.  For women, birth control pills can sometimes help relieve symptoms. You may need surgery if you have a severe case of hidradenitis suppurativa that does not respond to medicine. Surgery may involve:   Using a laser to clear the skin and remove hair follicles.  Opening and draining deep sores.  Removing the areas of skin that are diseased and scarred. HOME CARE INSTRUCTIONS  Learn as  much as you can about your disease, and work closely with your health care providers.  Take medicines only as directed by your health care provider.  If you were prescribed an antibiotic medicine, finish it all even if you start to feel better.  If you are overweight, losing weight may be very helpful. Try to reach and maintain a  healthy weight.  Do not use any tobacco products, including cigarettes, chewing tobacco, or electronic cigarettes. If you need help quitting, ask your health care provider.  Do not shave the areas where you get hidradenitis suppurativa.  Do not wear deodorant.  Wear loose-fitting clothes.  Try not to overheat and get sweaty.  Take a daily bleach bath as directed by your health care provider.  Fill your bathtub halfway with water.  Pour in  cup of unscented household bleach.  Soak for 5-10 minutes.  Cover sore areas with a warm, clean washcloth (compress) for 5-10 minutes. SEEK MEDICAL CARE IF:   You have a flare-up of hidradenitis suppurativa.  You have chills or a fever.  You are having trouble controlling your symptoms at home.   This information is not intended to replace advice given to you by your health care provider. Make sure you discuss any questions you have with your health care provider.   Document Released: 02/21/2004 Document Revised: 07/30/2014 Document Reviewed: 10/09/2013 Elsevier Interactive Patient Education [employer]

## 2015-12-05 NOTE — ED Notes (Signed)
Pt ambulated to room from waiting room. 

## 2015-12-05 NOTE — ED Provider Notes (Signed)
This patient had been evaluated in the ED earlier today with concern for recurrent abscesses and cellulitis.  Patient has received labs and IV antibiotics in the ED. He was originally admitted to the internal medicine teaching service with Dr. render as the attending. However when evaluated by the admitting team patient reports that he needs to go home to take care of childcare for his autistic son. The internal medicine service reports that they discussed with patient that he needs to return to the hospital as soon as possible for admission. They will arrange for direct admission on patient's return. Patient was discharged with strict instructions to return to the hospital as soon as possible for admission. He is advised that he is leaving against the advice of the emergency team and the admitting internal medicine team. Patient reports understanding of the risks and benefits of discharge and is in agreement with the plan of returning for direct admission.  Discussed with Dr. Anitra LauthPlunkett.   Isa RankinAnn B Morningstar Toft, MD 12/06/15 [phone]  Gwyneth SproutWhitney Plunkett, MD 12/06/15 2308

## 2015-12-06 DIAGNOSIS — N492 Inflammatory disorders of scrotum: Secondary | ICD-10-CM | POA: Diagnosis present

## 2015-12-06 LAB — BASIC METABOLIC PANEL
Anion gap: 10 (ref 5–15)
BUN: 8 mg/dL (ref 6–20)
CALCIUM: 8.6 mg/dL — AB (ref 8.9–10.3)
CO2: 27 mmol/L (ref 22–32)
Chloride: 100 mmol/L — ABNORMAL LOW (ref 101–111)
Creatinine, Ser: 0.95 mg/dL (ref 0.61–1.24)
GFR calc Af Amer: >60 mL/min (ref >60)
GLUCOSE: 108 mg/dL — AB (ref 65–99)
POTASSIUM: 3.2 mmol/L — AB (ref 3.5–5.1)
Sodium: 137 mmol/L (ref 135–145)

## 2015-12-06 LAB — CBC
HCT: 30 % — ABNORMAL LOW (ref 39.0–52.0)
Hemoglobin: 9 g/dL — ABNORMAL LOW (ref 13.0–17.0)
MCH: 19.1 pg — AB (ref 26.0–34.0)
MCHC: 30 g/dL (ref 30.0–36.0)
MCV: 63.6 fL — AB (ref 78.0–100.0)
PLATELETS: 344 10*3/uL (ref 150–400)
RBC: 4.72 MIL/uL (ref 4.22–5.81)
RDW: 17.3 % — AB (ref 11.5–15.5)
WBC: 8.7 10*3/uL (ref 4.0–10.5)

## 2015-12-06 MED ORDER — ENOXAPARIN SODIUM 40 MG/0.4ML ~~LOC~~ SOLN
40.0000 mg | Freq: Every day | SUBCUTANEOUS | Status: DC
  Filled 2015-12-06: qty 0.4

## 2015-12-06 MED ORDER — PIPERACILLIN-TAZOBACTAM 3.375 G IVPB
3.3750 g | Freq: Three times a day (TID) | INTRAVENOUS | Status: DC
  Administered 2015-12-06 – 2015-12-08 (×9): 3.375 g via INTRAVENOUS
  Filled 2015-12-06 (×13): qty 50

## 2015-12-06 MED ORDER — VANCOMYCIN HCL IN DEXTROSE 1-5 GM/200ML-% IV SOLN
1000.0000 mg | Freq: Three times a day (TID) | INTRAVENOUS | Status: DC
  Administered 2015-12-06 – 2015-12-08 (×8): 1000 mg via INTRAVENOUS
  Filled 2015-12-06 (×11): qty 200

## 2015-12-06 MED ORDER — VANCOMYCIN HCL IN DEXTROSE 1-5 GM/200ML-% IV SOLN: 1000.0000 mg | Freq: Two times a day (BID) | INTRAVENOUS | Status: DC

## 2015-12-06 MED ORDER — MORPHINE SULFATE (PF) 2 MG/ML IV SOLN
1.0000 mg | INTRAVENOUS | Status: DC | PRN
  Administered 2015-12-06 – 2015-12-09 (×17): 1 mg via INTRAVENOUS
  Filled 2015-12-06 (×17): qty 1

## 2015-12-06 MED ORDER — OXYCODONE-ACETAMINOPHEN 5-325 MG PO TABS
2.0000 | ORAL_TABLET | Freq: Four times a day (QID) | ORAL | Status: DC | PRN
  Administered 2015-12-06 – 2015-12-09 (×12): 2 via ORAL
  Filled 2015-12-06 (×13): qty 2

## 2015-12-06 MED ORDER — POTASSIUM CHLORIDE CRYS ER 20 MEQ PO TBCR
40.0000 meq | EXTENDED_RELEASE_TABLET | Freq: Once | ORAL | Status: AC
  Administered 2015-12-06: 40 meq via ORAL
  Filled 2015-12-06: qty 2

## 2015-12-06 NOTE — Consult Note (Signed)
[facility] Progress Note     Subjective: 35 y/o male who was seen by [place] 2.5 weeks ago for chronic severe hidradenitis.  He c/o pain throughout his perineum, buttocks, groin, scrotum and pubic region.  His right axilla is also significantly affected (but not left).  He has many actively draining areas.  He is extremely sore over his left buttock.  He says his scrotum and penis have swollen that he has trouble urinating.  He has been using peroxide, dial and zest soap and taking more frequent showers to try to help with this.  He is trying to be mindful of hand hygiene and transmission of infection, but several times during my examination he's cross-contaminating by touching his open draining wounds and touching objects in his room and other areas on his skin.  Mild elevation in WBC upon arrival, but normal now.   CT shows un-drained inferomedial left buttock abscess  Objective: Vital signs in last 24 hours: Temp:  [98.1 F (36.7 C)-99.8 F (37.7 C)] 98.1 F (36.7 C) (05/16 0558) Pulse Rate:  [69-95] 82 (05/16 0558) Resp:  [16-18] 18 (05/15 2256) BP: (111-136)/(58-102) 125/67 mmHg (05/16 0558) SpO2:  [99 %-100 %] 99 % (05/16 0558)    Intake/Output from previous day: 05/15 0701 - 05/16 0700 In: 240 [P.O.:240] Out: -  Intake/Output this shift: Total I/O In: 240 [P.O.:240] Out: -   PE: Gen:  Alert, NAD, pleasant Skin: Multiple areas of chronic scarring and edema, multiple draining sinuses of buttock, groin, scrotum, right axilla. Drainage is serosanguinous with some purulence, but thin. Skin is macerated. Scrotal/penile swelling noted.  These areas are hard to fully examen due to significant pain.  Left inferomedial buttock abscess palpated, but the patient won't let me fully evaluate any of the areas well due to jumping out of the way every time I palpate.  Lab Results:   Recent Labs  12/05/15 1155 12/06/15 0624  WBC 10.7* 8.7  HGB 10.1* 9.0*  HCT 33.3* 30.0*   PLT 374 344   BMET  Recent Labs  12/05/15 1155 12/06/15 0624  NA 138 137  K 4.2 3.2*  CL 104 100*  CO2 26 27  GLUCOSE 93 108*  BUN 7 8  CREATININE 1.10 0.95  CALCIUM 9.1 8.6*   PT/INR No results for input(s): LABPROT, INR in the last 72 hours. CMP     Component Value Date/Time   NA 137 12/06/2015 0624   K 3.2* 12/06/2015 0624   CL 100* 12/06/2015 0624   CO2 27 12/06/2015 0624   GLUCOSE 108* 12/06/2015 0624   BUN 8 12/06/2015 0624   CREATININE 0.95 12/06/2015 0624   CREATININE 0.84 04/12/2015 1219   CALCIUM 8.6* 12/06/2015 0624   PROT 7.6 11/15/2015 0710   ALBUMIN 2.8* 11/15/2015 0710   AST 19 11/15/2015 0710   ALT 28 11/15/2015 0710   ALKPHOS 68 11/15/2015 0710   BILITOT 0.5 11/15/2015 0710   GFRNONAA >60 12/06/2015 0624   GFRAA >60 12/06/2015 0624   Lipase  No results found for: LIPASE     Studies/Results: Ct Pelvis W Contrast  12/05/2015  CLINICAL DATA:  Scrotal swelling and penile pain, initial encounter EXAM: CT PELVIS WITH CONTRAST TECHNIQUE: Multidetector CT imaging of the pelvis was performed using the standard protocol following the bolus administration of intravenous contrast. CONTRAST:  100 mL ISOVUE-300 IOPAMIDOL (ISOVUE-300) INJECTION 61% COMPARISON:  11/15/2015 FINDINGS: Bladder is well distended. The intrapelvic structures are within normal limits. There is again noted thickening  of the gluteal cleft particularly on the left with extension into the left buttocks. Some small fluid collections are noted in this region but a dominant collection is noted inferiorly which measures 6.7 cm in greatest dimension. This is an increase from the prior exam a which time it measured 4.1 cm in greatest dimension. Diffuse small subcutaneous fluid collections are noted on the left relatively stable from the prior exam. Some mild scrotal edema is seen. Some soft tissue swelling is noted along the left inguinal region as well. This is stable from the prior exam. Reactive  lymphadenopathy is noted in the inguinal regions bilaterally relatively stable from the prior study. No acute bony abnormality is noted. IMPRESSION: The overall appearance of the skin thickening and subcutaneous fluid collections is stable with the exception of the most inferior/dominant collections along the inferomedial left buttock. It has increased in size as described. Electronically Signed   By: Alcide CleverMark  Lukens M.D.   On: 12/05/2015 14:50    Anti-infectives: Anti-infectives    Start     Dose/Rate Route Frequency Ordered Stop   12/06/15 0200  vancomycin (VANCOCIN) IVPB 1000 mg/200 mL premix     1,000 mg 200 mL/hr over 60 Minutes Intravenous Every 8 hours 12/06/15 0054     12/06/15 0045  piperacillin-tazobactam (ZOSYN) IVPB 3.375 g     3.375 g 12.5 mL/hr over 240 Minutes Intravenous Every 8 hours 12/06/15 0033     12/06/15 0045  vancomycin (VANCOCIN) IVPB 1000 mg/200 mL premix  Status:  Discontinued     1,000 mg 200 mL/hr over 60 Minutes Intravenous Every 12 hours 12/06/15 0033 12/06/15 0035       Assessment/Plan Chronic severe hidradenitis suppurativa - right axilla, perineum, groin, scrotum and buttocks -I think part of his problem was he was using peroxide even thought last admission I told him not to use cleaning agents tha burn the skin like bleach.   -He will need a referral to Dr. Magnus IvanBlackman for consideration of excision, but his infections will have to be under better control before that can be a possibility. He will also likely need to be followed by urology. -I rediscussed the basics of infection control, drainage control with dressings. I discussed no ecessive scrubbing or cleaning with bleach or peroxide solution. I discussed using gentle soap (dove or ivory) and water and dressing changes twice a day. No popping/squeezing abscesses. Use hot packs and soaks to allow them to open on their own. We discussed washing clothing/towels in hot water. Keep wounds open to the air at  times to help them dry out. -He has an un-drained abscess area on his inferiomedial buttock.  This area is relatively small but is so tender I'm not sure if he'll tolerate bedside drainage.  The skin is so edematous and he is bleeding quite a bit from these sites.  Will discuss with Dr. Janee Mornhompson about bedside vs OR drainage.  The patient has had multiple bedside drainage in the past and he is unsure if he can tolerate it this time.  Penile and scrotal swelling secondary to infectious/inflammatory process   LOS: 1 day   Addendum: Will schedule him for OR tomorrow 1000 for incision and drainage, NPO MN, hold lovenox tomorrow, consent ordered.  Dr. Janee Mornhompson to discuss operation with the patient.   Nonie HoyerMegan N Andreah Goheen 12/06/2015, 11:21 AM Pager: [phone]  (7am - 4:30pm M-F; 7am - 11:30am Sa/Su)

## 2015-12-06 NOTE — Consult Note (Addendum)
WOC consult requested for hydradenitis.  This complex medical condition is beyond WOC scope of practice.  Please consult surgical team for assessment and plan of care.  Pt is familiar to their service from previous admission. Discussed with primary team via phone and they plan to consult [facility]. Please re-consult if further assistance is needed.  Thank-you,  Cammie Mcgeeawn Braidan Ricciardi MSN, RN, CWOCN, [place]CN-AP, CNS [phone]

## 2015-12-06 NOTE — Progress Notes (Signed)
Subjective: Dan Koch reports continued pain and drainage from his R axilla, left scrotal lesion and left gluteal fold. Reports pain is not well controlled on oral pain medications. Denies any fevers, chills, nausea, vomiting, abdominal pain. Reports no difficulty with urination. States swelling is improving.   Objective: Vital signs in last 24 hours: Filed Vitals:   12/05/15 2256 12/06/15 0558  BP: 111/68 125/67  Pulse: 95 82  Temp: 99.8 F (37.7 C) 98.1 F (36.7 C)  TempSrc: Oral Oral  Resp: 18   SpO2: 100% 99%   Weight change:   Intake/Output Summary (Last 24 hours) at 12/06/15 1037 Last data filed at 12/06/15 0211  Gross per 24 hour  Intake    240 ml  Output      0 ml  Net    240 ml    General: alert, well-developed, and cooperative to examination.  Lungs: CTAB, no wheezes or crackles Heart: normal rate, regular rhythm, no murmur, no gallop, and no rub.  Abdomen: soft, non-tender, normal bowel sounds, no distention, no guarding, no rebound tenderness Pulses: 2+ DP/PT pulses bilaterally Extremities: No cyanosis, clubbing, edema GU: Large serous draining lesions on left scrotum with largest 4 cm in length on upper left scrotum with smaller lesion below, left gluteal fold with large indurated and fluctuant lesion with sinus tracking to two small lesions with purulent drainage expressed with manipulation. Significant induration diffusely along scrotum particularly R lower area.  Skin: R axilla with multiple indurated lesions with small lesion draining purulent fluid Psych: normal mood and affect  Medications: I have reviewed the patient's current medications. Scheduled Meds: . enoxaparin (LOVENOX) injection  40 mg Subcutaneous Daily  . piperacillin-tazobactam (ZOSYN)  IV  3.375 g Intravenous Q8H  . vancomycin  1,000 mg Intravenous Q8H   Continuous Infusions:  PRN Meds:.morphine injection, oxyCODONE-acetaminophen Assessment/Plan:  Gluteal abscess / worsening  hidradenitis suppurativa: CT demonstrating previously known small subcutaneous edema and scrotal edema, with L gluteal cleft progressive compared to prior study. He was started on vancomycin and zosyn in the ED initially. He remains afebrile. White count improved from 10.7 on admission to 8.7 today. Continues to have purulent drainage from multiple sites including his right axilla, left scrotum and left gluteal cleft. Has previously been on PO clindamycin as well as clinda cream. Pain not well controlled on PO medications. Will continue IV ABX today. Consult surgery for evaluation. -Vancomycin 5/15>>> -Zosyn 5/15>>> -Superficial wound Cx obtained last night, pending -Oxycodone prn -Morphine prn -Repeat AM CBC, Bmet -Surgical consult, appreciate help -Has June 5 appointment at [facility]  Anemia: Hgb 10.1 > 9.0 overnight. Improved from previous admission with 8.7. Felt to be iron deficiency anemia at that time with MCV 63. Total iron was low but ferritin and TIBC not that abnormal. Ferrtin may not have been accurate given ongoing infection. MCV remains low at 63. -Consider restarting iron vs ferraheme  -monitor CBC  FULL CODE  Diet: Regular  VTE ppx: [place] enoxaparin  Dispo: Disposition is deferred at this time, awaiting improvement of current medical problems.  The patient does have a current PCP (Dan Shaggy, MD) and does need an [facility] hospital follow-up appointment after discharge.  The patient does have transportation limitations that hinder transportation to clinic appointments.  .Services Needed at time of discharge: Y = Yes, Blank = No PT:   OT:   RN:   Equipment:   Other:     LOS: 1 day   Valentino Nose, MD IMTS PGY-1 [phone]  12/06/2015, 10:37 AM

## 2015-12-06 NOTE — Care Management Note (Addendum)
Case Management Note  Patient Details  Name: Ranae PalmsSamuel Baker Pruden MRN: [id number] Date of Birth: 01/09/1981  Subjective/Objective:                    Action/Plan: Patient discharged 11/17/15 MATCH letter used at that time . [facility]ed appointment at [facility].  Expected Discharge Date:                  Expected Discharge Plan:  Home/Self Care  In-House Referral:     Discharge planning Services     Post Acute Care Choice:    Choice offered to:     DME Arranged:    DME Agency:     HH Arranged:    HH Agency:     Status of Service:  In process, will continue to follow  Medicare Important Message Given:    Date Medicare IM Given:    Medicare IM give by:    Date Additional Medicare IM Given:    Additional Medicare Important Message give by:     If discussed at Long Length of Stay Meetings, dates discussed:    Additional Comments:  Kingsley PlanWile, Damien Batty Marie, RN 12/06/2015, 8:23 AM

## 2015-12-06 NOTE — Progress Notes (Signed)
Pharmacy Antibiotic Note  Dan Koch is a 35 y.o. male admitted on 12/05/2015 with worsening cellulitis .  Pharmacy has been consulted for Vancomycin and Zosyn dosing.  Vancomycin 1 g IV given in ED at  1430  Plan: Vancomycin 1 g IV q8h Zosyn 3.375 g IV q8h      Temp (24hrs), Avg:99.2 F (37.3 C), Min:98.6 F (37 C), Max:99.8 F (37.7 C)   Recent Labs Lab 12/05/15 1155  WBC 10.7*  CREATININE 1.10    CrCl cannot be calculated (Unknown ideal weight.).    Allergies  Allergen Reactions  . Strawberry Extract Swelling    Tongue and lip swelling  . Tomato Swelling    Tongue and lip swelling  . Citrus Swelling    Tongue and lip swelling  . Tramadol Other (See Comments)    Pt reports negative personality changes- makes him mean, snappy    Dan Koch, Dan Koch 12/06/2015 12:51 AM

## 2015-12-07 ENCOUNTER — Encounter (HOSPITAL_COMMUNITY)
Admission: AD | Disposition: A | Discharge status: Home Health | Payer: Self-pay | Source: Ambulatory Visit | Attending: Internal Medicine

## 2015-12-07 ENCOUNTER — Inpatient Hospital Stay (HOSPITAL_COMMUNITY): Payer: Medicaid Other | Admitting: Anesthesiology

## 2015-12-07 ENCOUNTER — Encounter (HOSPITAL_COMMUNITY): Payer: Self-pay | Admitting: Anesthesiology

## 2015-12-07 HISTORY — PX: INCISION AND DRAINAGE ABSCESS: SHX5864

## 2015-12-07 LAB — CBC
HCT: 32.2 % — ABNORMAL LOW (ref 39.0–52.0)
Hemoglobin: 9.7 g/dL — ABNORMAL LOW (ref 13.0–17.0)
MCH: 19 pg — ABNORMAL LOW (ref 26.0–34.0)
MCHC: 30.1 g/dL (ref 30.0–36.0)
MCV: 63.1 fL — AB (ref 78.0–100.0)
Platelets: 341 10*3/uL (ref 150–400)
RBC: 5.1 MIL/uL (ref 4.22–5.81)
RDW: 17.2 % — AB (ref 11.5–15.5)
WBC: 9.1 10*3/uL (ref 4.0–10.5)

## 2015-12-07 LAB — BASIC METABOLIC PANEL
Anion gap: 12 (ref 5–15)
CO2: 26 mmol/L (ref 22–32)
Calcium: 8.9 mg/dL (ref 8.9–10.3)
Chloride: 101 mmol/L (ref 101–111)
Creatinine, Ser: 1.06 mg/dL (ref 0.61–1.24)
Glucose, Bld: 85 mg/dL (ref 65–99)
POTASSIUM: 3.9 mmol/L (ref 3.5–5.1)
SODIUM: 139 mmol/L (ref 135–145)

## 2015-12-07 LAB — SURGICAL PCR SCREEN
MRSA, PCR: NEGATIVE
STAPHYLOCOCCUS AUREUS: NEGATIVE

## 2015-12-07 LAB — MAGNESIUM: Magnesium: 1.8 mg/dL (ref 1.7–2.4)

## 2015-12-07 SURGERY — INCISION AND DRAINAGE, ABSCESS
Anesthesia: General | Site: Buttocks

## 2015-12-07 MED ORDER — HYDROCODONE-ACETAMINOPHEN 7.5-325 MG PO TABS
ORAL_TABLET | ORAL | Status: AC
  Filled 2015-12-07: qty 1

## 2015-12-07 MED ORDER — FENTANYL CITRATE (PF) 250 MCG/5ML IJ SOLN
INTRAMUSCULAR | Status: AC
  Filled 2015-12-07: qty 5

## 2015-12-07 MED ORDER — HYDROMORPHONE HCL 1 MG/ML IJ SOLN
INTRAMUSCULAR | Status: AC
  Filled 2015-12-07: qty 1

## 2015-12-07 MED ORDER — ONDANSETRON HCL 4 MG/2ML IJ SOLN
INTRAMUSCULAR | Status: DC | PRN
  Administered 2015-12-07: 4 mg via INTRAVENOUS

## 2015-12-07 MED ORDER — ROCURONIUM BROMIDE 50 MG/5ML IV SOLN
INTRAVENOUS | Status: AC
  Filled 2015-12-07: qty 1

## 2015-12-07 MED ORDER — DEXAMETHASONE SODIUM PHOSPHATE 4 MG/ML IJ SOLN
INTRAMUSCULAR | Status: DC | PRN
  Administered 2015-12-07: 4 mg via INTRAVENOUS

## 2015-12-07 MED ORDER — MIDAZOLAM HCL 2 MG/2ML IJ SOLN
INTRAMUSCULAR | Status: AC
  Filled 2015-12-07: qty 2

## 2015-12-07 MED ORDER — MIDAZOLAM HCL 5 MG/5ML IJ SOLN
INTRAMUSCULAR | Status: DC | PRN
  Administered 2015-12-07: 2 mg via INTRAVENOUS

## 2015-12-07 MED ORDER — 0.9 % SODIUM CHLORIDE (POUR BTL) OPTIME
TOPICAL | Status: DC | PRN
  Administered 2015-12-07: 1000 mL

## 2015-12-07 MED ORDER — LIDOCAINE 2% (20 MG/ML) 5 ML SYRINGE
INTRAMUSCULAR | Status: AC
  Filled 2015-12-07: qty 5

## 2015-12-07 MED ORDER — PROPOFOL 10 MG/ML IV BOLUS
INTRAVENOUS | Status: AC
  Filled 2015-12-07: qty 20

## 2015-12-07 MED ORDER — BUPIVACAINE-EPINEPHRINE 0.25% -1:200000 IJ SOLN
INTRAMUSCULAR | Status: DC | PRN
  Administered 2015-12-07: 30 mL

## 2015-12-07 MED ORDER — LACTATED RINGERS IV SOLN
INTRAVENOUS | Status: DC
  Administered 2015-12-07 (×3): via INTRAVENOUS

## 2015-12-07 MED ORDER — PROPOFOL 10 MG/ML IV BOLUS
INTRAVENOUS | Status: DC | PRN
  Administered 2015-12-07: 200 mg via INTRAVENOUS

## 2015-12-07 MED ORDER — KETOROLAC TROMETHAMINE 30 MG/ML IJ SOLN
30.0000 mg | Freq: Once | INTRAMUSCULAR | Status: AC | PRN
  Administered 2015-12-07: 30 mg via INTRAVENOUS

## 2015-12-07 MED ORDER — POTASSIUM CHLORIDE CRYS ER 20 MEQ PO TBCR
40.0000 meq | EXTENDED_RELEASE_TABLET | Freq: Once | ORAL | Status: AC
  Administered 2015-12-07: 40 meq via ORAL
  Filled 2015-12-07: qty 2

## 2015-12-07 MED ORDER — KETOROLAC TROMETHAMINE 30 MG/ML IJ SOLN
INTRAMUSCULAR | Status: AC
Start: 2015-12-07 — End: 2015-12-07
  Filled 2015-12-07: qty 1

## 2015-12-07 MED ORDER — BUPIVACAINE-EPINEPHRINE (PF) 0.25% -1:200000 IJ SOLN
INTRAMUSCULAR | Status: AC
  Filled 2015-12-07: qty 30

## 2015-12-07 MED ORDER — ENOXAPARIN SODIUM 40 MG/0.4ML ~~LOC~~ SOLN
40.0000 mg | SUBCUTANEOUS | Status: DC
  Filled 2015-12-07 (×2): qty 0.4

## 2015-12-07 MED ORDER — HYDROMORPHONE HCL 1 MG/ML IJ SOLN
0.2500 mg | INTRAMUSCULAR | Status: DC | PRN
  Administered 2015-12-07 (×4): 0.5 mg via INTRAVENOUS

## 2015-12-07 MED ORDER — MAGNESIUM SULFATE 2 GM/50ML IV SOLN
2.0000 g | Freq: Once | INTRAVENOUS | Status: AC
  Administered 2015-12-07: 2 g via INTRAVENOUS
  Filled 2015-12-07: qty 50

## 2015-12-07 MED ORDER — FENTANYL CITRATE (PF) 100 MCG/2ML IJ SOLN
INTRAMUSCULAR | Status: DC | PRN
  Administered 2015-12-07 (×3): 50 ug via INTRAVENOUS
  Administered 2015-12-07: 100 ug via INTRAVENOUS

## 2015-12-07 MED ORDER — LIDOCAINE HCL (CARDIAC) 20 MG/ML IV SOLN
INTRAVENOUS | Status: DC | PRN
  Administered 2015-12-07: 60 mg via INTRAVENOUS

## 2015-12-07 MED ORDER — HYDROCODONE-ACETAMINOPHEN 7.5-325 MG PO TABS
1.0000 | ORAL_TABLET | Freq: Once | ORAL | Status: AC | PRN
  Administered 2015-12-07: 1 via ORAL

## 2015-12-07 MED ORDER — ONDANSETRON HCL 4 MG/2ML IJ SOLN
INTRAMUSCULAR | Status: AC
  Filled 2015-12-07: qty 2

## 2015-12-07 SURGICAL SUPPLY — 45 items
BLADE SURG 15 STRL LF DISP TIS (BLADE) ×1 IMPLANT
BLADE SURG 15 STRL SS (BLADE) ×1
BNDG GAUZE ELAST 4 BULKY (GAUZE/BANDAGES/DRESSINGS) ×2 IMPLANT
CANISTER SUCTION 2500CC (MISCELLANEOUS) ×2 IMPLANT
COVER MAYO STAND STRL (DRAPES) ×2 IMPLANT
COVER SURGICAL LIGHT HANDLE (MISCELLANEOUS) ×2 IMPLANT
DRAPE LAPAROSCOPIC ABDOMINAL (DRAPES) ×2 IMPLANT
DRAPE UTILITY XL STRL (DRAPES) IMPLANT
DRSG PAD ABDOMINAL 8X10 ST (GAUZE/BANDAGES/DRESSINGS) ×2 IMPLANT
ELECT CAUTERY BLADE 6.4 (BLADE) ×4 IMPLANT
ELECT REM PT RETURN 9FT ADLT (ELECTROSURGICAL) ×2
ELECTRODE REM PT RTRN 9FT ADLT (ELECTROSURGICAL) ×1 IMPLANT
GAUZE PACKING IODOFORM 1X5 (MISCELLANEOUS) ×2 IMPLANT
GAUZE SPONGE 4X4 12PLY STRL (GAUZE/BANDAGES/DRESSINGS) ×2 IMPLANT
GLOVE BIO SURGEON STRL SZ7.5 (GLOVE) ×4 IMPLANT
GLOVE BIO SURGEON STRL SZ8 (GLOVE) ×4 IMPLANT
GLOVE BIOGEL PI IND STRL 7.0 (GLOVE) ×1 IMPLANT
GLOVE BIOGEL PI IND STRL 7.5 (GLOVE) ×2 IMPLANT
GLOVE BIOGEL PI IND STRL 8 (GLOVE) ×1 IMPLANT
GLOVE BIOGEL PI INDICATOR 7.0 (GLOVE) ×1
GLOVE BIOGEL PI INDICATOR 7.5 (GLOVE) ×2
GLOVE BIOGEL PI INDICATOR 8 (GLOVE) ×1
GLOVE SURG SS PI 7.0 STRL IVOR (GLOVE) ×2 IMPLANT
GOWN STRL REUS W/ TWL LRG LVL3 (GOWN DISPOSABLE) ×2 IMPLANT
GOWN STRL REUS W/ TWL XL LVL3 (GOWN DISPOSABLE) ×1 IMPLANT
GOWN STRL REUS W/TWL LRG LVL3 (GOWN DISPOSABLE) ×2
GOWN STRL REUS W/TWL XL LVL3 (GOWN DISPOSABLE) ×1
KIT BASIN OR (CUSTOM PROCEDURE TRAY) ×2 IMPLANT
KIT ROOM TURNOVER OR (KITS) ×2 IMPLANT
NEEDLE 22X1 1/2 (OR ONLY) (NEEDLE) ×2 IMPLANT
NS IRRIG 1000ML POUR BTL (IV SOLUTION) ×2 IMPLANT
PACK GENERAL/GYN (CUSTOM PROCEDURE TRAY) IMPLANT
PACK LITHOTOMY IV (CUSTOM PROCEDURE TRAY) ×2 IMPLANT
PAD ARMBOARD 7.5X6 YLW CONV (MISCELLANEOUS) ×2 IMPLANT
PENCIL BUTTON HOLSTER BLD 10FT (ELECTRODE) ×4 IMPLANT
SPONGE LAP 18X18 X RAY DECT (DISPOSABLE) ×2 IMPLANT
STAPLER VISISTAT 35W (STAPLE) ×2 IMPLANT
SWAB COLLECTION DEVICE MRSA (MISCELLANEOUS) ×2 IMPLANT
SYR BULB IRRIGATION 50ML (SYRINGE) ×2 IMPLANT
SYR CONTROL 10ML LL (SYRINGE) ×2 IMPLANT
TOWEL OR 17X24 6PK STRL BLUE (TOWEL DISPOSABLE) ×2 IMPLANT
TOWEL OR 17X26 10 PK STRL BLUE (TOWEL DISPOSABLE) ×2 IMPLANT
TUBE ANAEROBIC SPECIMEN COL (MISCELLANEOUS) ×2 IMPLANT
TUBE CONNECTING 12X1/4 (SUCTIONS) ×2 IMPLANT
YANKAUER SUCT BULB TIP NO VENT (SUCTIONS) ×2 IMPLANT

## 2015-12-07 NOTE — Op Note (Signed)
12/05/2015 - 12/07/2015  10:54 AM  PATIENT:  Dan PalmsSamuel Baker Greeson  35 y.o. male  PRE-OPERATIVE DIAGNOSIS:  HYDRADENITIS  POST-OPERATIVE DIAGNOSIS:  HYDRADENITIS  PROCEDURE:  Procedure(s): INCISION AND DRAINAGE PERINIUM AND BUTTOCK COMPLEX ABSCESS X 3  SURGEON:  Violeta GelinasBurke Satish Hammers, M.D.  ASSISTANTS: Jaynie BreamJoshua Rickey, MD   ANESTHESIA:   local and general  EBL:  Total I/O In: 1000 [I.V.:1000] Out: 600 [Urine:600]  BLOOD ADMINISTERED:none  DRAINS: none   SPECIMEN:  Excision  DISPOSITION OF SPECIMEN:  PATHOLOGY  COUNTS:  YES Findings: 3 areas of abscess associated with his hidradenitis along his left gluteal region  Procedure in detail: Remi DeterSamuel was identified in the preop holding area. Informed consent was obtained.general anesthesia with LMA was administered by anesthesia. He was placed in lithotomy position. His perineum was prepped and draped in sterile fashion. Time out procedure was performed. 3 areas of fluctuance were identified in the left gluteal region. Elliptical incisions were made in each to excise active hidradenitis disease. Cultures were sent. All fluctuant pockets were opened. The more posterior to opening sexually connected. All 3 wounds were copiously irrigated hemostasis was obtained. They were packed with one-inch iodoform gauze and a sterile dressing was applied. Local was injected at the completion of the procedure. He tolerated procedure without apparent complications and was taken recovery in stable condition. DICTATION: .[name] Dictation  PATIENT DISPOSITION:  PACU - hemodynamically stable.   Delay start of Pharmacological VTE agent (>24hrs) due to surgical blood loss or risk of bleeding:  no  Violeta GelinasBurke Loman Logan, MD, MPH, FACS Pager: [phone]  5/17/201710:54 AM              12/05/2015 - 12/07/2015  11:00 AM  PATIENT:  Dan PalmsSamuel Baker Koch  35 y.o. male  PRE-OPERATIVE DIAGNOSIS:  HYDRADENITIS  POST-OPERATIVE DIAGNOSIS:   HYDRADENITIS  PROCEDURE:  Procedure(s): INCISION AND DRAINAGE PERINIUM AND BUTTOCK  SURGEON:  Violeta GelinasBurke Jailyn Langhorst, MD  ASSISTANTS: Madelin RearJosh Rickey. MD   ANESTHESIA:   local and general  EBL:  Total I/O In: 1000 [I.V.:1000] Out: 600 [Urine:600]  BLOOD ADMINISTERED:none  DRAINS: none   SPECIMEN:  Excision  DISPOSITION OF SPECIMEN:  PATHOLOGY  COUNTS:  YES  DICTATION: .Dragon Dictation  PATIENT DISPOSITION:  PACU - hemodynamically stable.   Delay start of Pharmacological VTE agent (>24hrs) due to surgical blood loss or risk of bleeding:  no  Violeta GelinasBurke Eleanna Theilen, MD, MPH, FACS Pager: [phone]  5/17/201711:00 AM

## 2015-12-07 NOTE — Anesthesia Preprocedure Evaluation (Addendum)
Anesthesia Evaluation  Patient identified by MRN, date of birth, ID band Patient awake    Reviewed: Allergy & Precautions, NPO status , Patient's Chart, lab work & pertinent test results  Airway Mallampati: II  TM Distance: >3 FB Neck ROM: Full    Dental  (+) Dental Advisory Given   Pulmonary neg pulmonary ROS,    Pulmonary exam normal        Cardiovascular negative cardio ROS   Rhythm:Regular Rate:Normal     Neuro/Psych negative neurological ROS     GI/Hepatic negative GI ROS, Neg liver ROS,   Endo/Other    Renal/GU negative Renal ROS     Musculoskeletal   Abdominal   Peds  Hematology  (+) anemia ,   Anesthesia Other Findings   Reproductive/Obstetrics                            Lab Results  Component Value Date   WBC 8.7 12/06/2015   HGB 9.0* 12/06/2015   HCT 30.0* 12/06/2015   MCV 63.6* 12/06/2015   PLT 344 12/06/2015   Lab Results  Component Value Date   CREATININE 0.95 12/06/2015   BUN 8 12/06/2015   NA 137 12/06/2015   K 3.2* 12/06/2015   CL 100* 12/06/2015   CO2 27 12/06/2015    Anesthesia Physical Anesthesia Plan  ASA: II  Anesthesia Plan: General   Post-op Pain Management:    Induction: Intravenous  Airway Management Planned: Oral ETT and LMA  Additional Equipment:   Intra-op Plan:   Post-operative Plan: Extubation in OR  Informed Consent: I have reviewed the patients History and Physical, chart, labs and discussed the procedure including the risks, benefits and alternatives for the proposed anesthesia with the patient or authorized representative who has indicated his/her understanding and acceptance.   Dental advisory given  Plan Discussed with: CRNA  Anesthesia Plan Comments:        Anesthesia Quick Evaluation

## 2015-12-07 NOTE — Transfer of Care (Signed)
Immediate Anesthesia Transfer of Care Note  Patient: Dan Koch  Procedure(s) Performed: Procedure(s): INCISION AND DRAINAGE PERINIUM AND BUTTOCK (N/A)  Patient Location: PACU  Anesthesia Type:General  Level of Consciousness: awake, oriented and patient cooperative  Airway & Oxygen Therapy: Patient Spontanous Breathing and Patient connected to nasal cannula oxygen  Post-op Assessment: Report given to RN and Post -op Vital signs reviewed and stable  Post vital signs: Reviewed  Last Vitals:  Filed Vitals:   12/06/15 2133 12/07/15 0500  BP: 145/89 137/80  Pulse: 86 78  Temp: 37 C 36.7 C  Resp: 18 18    Last Pain:  Filed Vitals:   12/07/15 0910  PainSc: 9          Complications: No apparent anesthesia complications

## 2015-12-07 NOTE — Progress Notes (Signed)
  Subjective: No acute changes,  He is concerned that we are going to include I&D of scrotum.  I told him plan is for buttocks only.    Objective: Vital signs in last 24 hours: Temp:  [98.1 F (36.7 C)-99.1 F (37.3 C)] 98.1 F (36.7 C) (05/17 0500) Pulse Rate:  [78-88] 78 (05/17 0500) Resp:  [18] 18 (05/17 0500) BP: (137-161)/(75-89) 137/80 mmHg (05/17 0500) SpO2:  [100 %] 100 % (05/17 0500)    Intake/Output from previous day: 05/16 0701 - 05/17 0700 In: 480 [P.O.:480] Out: 1400 [Urine:1400] Intake/Output this shift:    General appearance: alert, cooperative and no distress Skin: Skin color, texture, turgor normal. No rashes or lesions or not examined this Am  Lab Results:   Recent Labs  12/05/15 1155 12/06/15 0624  WBC 10.7* 8.7  HGB 10.1* 9.0*  HCT 33.3* 30.0*  PLT 374 344    BMET  Recent Labs  12/05/15 1155 12/06/15 0624  NA 138 137  K 4.2 3.2*  CL 104 100*  CO2 26 27  GLUCOSE 93 108*  BUN 7 8  CREATININE 1.10 0.95  CALCIUM 9.1 8.6*   PT/INR No results for input(s): LABPROT, INR in the last 72 hours.  No results for input(s): AST, ALT, ALKPHOS, BILITOT, PROT, ALBUMIN in the last 168 hours.   Lipase  No results found for: LIPASE   Studies/Results: Ct Pelvis W Contrast  12/05/2015  CLINICAL DATA:  Scrotal swelling and penile pain, initial encounter EXAM: CT PELVIS WITH CONTRAST TECHNIQUE: Multidetector CT imaging of the pelvis was performed using the standard protocol following the bolus administration of intravenous contrast. CONTRAST:  100 mL ISOVUE-300 IOPAMIDOL (ISOVUE-300) INJECTION 61% COMPARISON:  11/15/2015 FINDINGS: Bladder is well distended. The intrapelvic structures are within normal limits. There is again noted thickening of the gluteal cleft particularly on the left with extension into the left buttocks. Some small fluid collections are noted in this region but a dominant collection is noted inferiorly which measures 6.7 cm in greatest  dimension. This is an increase from the prior exam a which time it measured 4.1 cm in greatest dimension. Diffuse small subcutaneous fluid collections are noted on the left relatively stable from the prior exam. Some mild scrotal edema is seen. Some soft tissue swelling is noted along the left inguinal region as well. This is stable from the prior exam. Reactive lymphadenopathy is noted in the inguinal regions bilaterally relatively stable from the prior study. No acute bony abnormality is noted. IMPRESSION: The overall appearance of the skin thickening and subcutaneous fluid collections is stable with the exception of the most inferior/dominant collections along the inferomedial left buttock. It has increased in size as described. Electronically Signed   By: Alcide CleverMark  Lukens M.D.   On: 12/05/2015 14:50    Medications: . piperacillin-tazobactam (ZOSYN)  IV  3.375 g Intravenous Q8H  . vancomycin  1,000 mg Intravenous Q8H    Assessment/Plan Chronic severe hidradenitis suppurativa - right axilla, perineum, groin, scrotum and buttocks Penile and scrotal swelling secondary to infectious/inflammatory process FEN:  Npo  ID:  Day 3 Zosyn and vancomycin VTE:  SCD added      Plan:  Surgery later today.     LOS: 2 days    Dinora Hemm 12/07/2015 [phone]

## 2015-12-07 NOTE — Anesthesia Procedure Notes (Signed)
Procedure Name: LMA Insertion Date/Time: 12/07/2015 10:10 AM Performed by: Romie MinusOCK, Keelan Tripodi K Pre-anesthesia Checklist: Patient identified, Emergency Drugs available, Suction available and Patient being monitored Patient Re-evaluated:Patient Re-evaluated prior to inductionOxygen Delivery Method: Circle System Utilized Preoxygenation: Pre-oxygenation with 100% oxygen Intubation Type: IV induction Ventilation: Mask ventilation without difficulty LMA: LMA inserted LMA Size: 4.0 Number of attempts: 1 Airway Equipment and Method: Bite block Placement Confirmation: positive ETCO2 and breath sounds checked- equal and bilateral Tube secured with: Tape Dental Injury: Teeth and Oropharynx as per pre-operative assessment

## 2015-12-07 NOTE — Progress Notes (Signed)
Subjective: [name] reports continued pain and drainage from his R axilla, left scrotal lesion and left gluteal fold. Reports pain is still not well controlled on IV pain medications.  Denies any fevers, chills, nausea, vomiting, abdominal pain. Reports no difficulty with urination. States swelling is worse today. Will go for I&O of perineal abscess this morning.   Objective: Vital signs in last 24 hours: Filed Vitals:   12/06/15 0558 12/06/15 1500 12/06/15 2133 12/07/15 0500  BP: 125/67 161/75 145/89 137/80  Pulse: 82 88 86 78  Temp: 98.1 F (36.7 C) 99.1 F (37.3 C) 98.6 F (37 C) 98.1 F (36.7 C)  TempSrc: Oral Oral Oral Oral  Resp:  18 18 18   SpO2: 99% 100% 100% 100%   Weight change:   Intake/Output Summary (Last 24 hours) at 12/07/15 0727 Last data filed at 12/07/15 0524  Gross per 24 hour  Intake    480 ml  Output   1400 ml  Net   -920 ml    General: alert, well-developed, and cooperative to examination.  Lungs: CTAB, no wheezes or crackles Heart: normal rate, regular rhythm, no murmur, no gallop, and no rub.  Abdomen: soft, non-tender, normal bowel sounds, no distention, no guarding, no rebound tenderness Pulses: 2+ DP/PT pulses bilaterally Extremities: No cyanosis, clubbing, edema GU: Large serous draining lesions on left scrotum with largest 4 cm in length on upper left scrotum with smaller lesion below, left gluteal fold with large indurated and fluctuant lesion with sinus tracking to two small lesions with purulent drainage expressed with manipulation. Significant induration diffusely along scrotum particularly R lower area.  Skin: R axilla with multiple indurated lesions with small lesion draining purulent fluid Psych: normal mood and affect  Medications: I have reviewed the patient's current medications. Scheduled Meds: . piperacillin-tazobactam (ZOSYN)  IV  3.375 g Intravenous Q8H  . vancomycin  1,000 mg Intravenous Q8H   Continuous Infusions:  PRN  Meds:.morphine injection, oxyCODONE-acetaminophen Assessment/Plan:  Gluteal abscess / worsening hidradenitis suppurativa: CT demonstrating previously known small subcutaneous edema and scrotal edema, with L gluteal cleft progressive compared to prior study. Currently on vancomycin and zosyn. He remains afebrile. White count 10.7 > 8.7 > 9.1. Continues to have purulent drainage from multiple sites including his right axilla, left scrotum and left gluteal cleft. Has previously been on PO clindamycin as well as clinda cream. Pain is not well controlled. Will continue IV ABX today. Surgery evaluated patient yesterday, plan for I&D of gluteal abscess today.  -Vancomycin 5/15>>> -Zosyn 5/15>>> -Superficial wound Cx pending -Oxycodone prn -Morphine prn -Repeat AM CBC, Bmet -Surgical consult, appreciate help -[facility]  Anemia: Hgb 10.1 > 9.0 > 9.7. Improved from previous admission with 8.7. Felt to be iron deficiency anemia at that time with MCV 63. Total iron was low but ferritin and TIBC not that abnormal. Ferrtin may not have been accurate given ongoing infection. MCV remains low at 63, RDW 17.2. -Consider restarting iron vs ferraheme once infection controlled -May need further evaluation for other causes of microcytic anemia -monitor CBC  FULL CODE  Diet: Regular  VTE ppx: [place] enoxaparin  Dispo: Disposition is deferred at this time, awaiting improvement of current medical problems.  The patient does have a current PCP (Jaclyn ShaggyEnobong Amao, MD) and [facility] after discharge.  The patient does have transportation limitations that hinder transportation to clinic appointments.  .Services Needed at time of discharge: Y = Yes, Blank =  No PT:   OT:   RN:   Equipment:   Other:     LOS: 2 days   Valentino Nose, MD IMTS PGY-1 [phone] 12/07/2015, 7:27 AM

## 2015-12-08 ENCOUNTER — Encounter (HOSPITAL_COMMUNITY): Payer: Self-pay | Admitting: General Surgery

## 2015-12-08 LAB — CBC
HCT: 29.2 % — ABNORMAL LOW (ref 39.0–52.0)
Hemoglobin: 9 g/dL — ABNORMAL LOW (ref 13.0–17.0)
MCH: 19.2 pg — AB (ref 26.0–34.0)
MCHC: 30.8 g/dL (ref 30.0–36.0)
MCV: 62.4 fL — ABNORMAL LOW (ref 78.0–100.0)
PLATELETS: 353 10*3/uL (ref 150–400)
RBC: 4.68 MIL/uL (ref 4.22–5.81)
RDW: 17.1 % — AB (ref 11.5–15.5)
WBC: 9.4 10*3/uL (ref 4.0–10.5)

## 2015-12-08 LAB — BASIC METABOLIC PANEL
Anion gap: 10 (ref 5–15)
BUN: 8 mg/dL (ref 6–20)
CALCIUM: 8.6 mg/dL — AB (ref 8.9–10.3)
CO2: 26 mmol/L (ref 22–32)
CREATININE: 0.98 mg/dL (ref 0.61–1.24)
Chloride: 101 mmol/L (ref 101–111)
GFR calc non Af Amer: >60 mL/min (ref >60)
Glucose, Bld: 119 mg/dL — ABNORMAL HIGH (ref 65–99)
Potassium: 4.2 mmol/L (ref 3.5–5.1)
SODIUM: 137 mmol/L (ref 135–145)

## 2015-12-08 LAB — MAGNESIUM: MAGNESIUM: 2 mg/dL (ref 1.7–2.4)

## 2015-12-08 MED ORDER — "THROMBI-PAD 3""X3"" EX PADS"
4.0000 | MEDICATED_PAD | Freq: Once | CUTANEOUS | Status: AC
  Administered 2015-12-08: 4 via TOPICAL
  Filled 2015-12-08: qty 4

## 2015-12-08 MED ORDER — SULFAMETHOXAZOLE-TRIMETHOPRIM 800-160 MG PO TABS
2.0000 | ORAL_TABLET | Freq: Two times a day (BID) | ORAL | Status: DC
  Administered 2015-12-08 – 2015-12-09 (×2): 2 via ORAL
  Filled 2015-12-08 (×2): qty 2

## 2015-12-08 MED ORDER — MORPHINE SULFATE (PF) 4 MG/ML IV SOLN
6.0000 mg | Freq: Once | INTRAVENOUS | Status: AC
  Administered 2015-12-08: 4 mg via INTRAVENOUS
  Filled 2015-12-08: qty 2

## 2015-12-08 MED ORDER — SILVER NITRATE-POT NITRATE 75-25 % EX MISC
20.0000 | CUTANEOUS | Status: DC | PRN
  Filled 2015-12-08: qty 20

## 2015-12-08 NOTE — Progress Notes (Signed)
   Subjective: [name] reports feeling much better today following I&D. Pain is improved but still there. No complaints this morning.   Objective: Vital signs in last 24 hours: Filed Vitals:   12/07/15 1324 12/07/15 1350 12/07/15 2232 12/08/15 0505  BP: 126/83 124/74 126/68 113/73  Pulse: 62 81 72 58  Temp: 97.9 F (36.6 C) 98.3 F (36.8 C) 98.3 F (36.8 C) 98.2 F (36.8 C)  TempSrc:  Oral Oral   Resp: 12 16 18 19   SpO2: 100% 100% 100% 100%   Weight change:   Intake/Output Summary (Last 24 hours) at 12/08/15 0942 Last data filed at 12/08/15 0605  Gross per 24 hour  Intake   1540 ml  Output   1310 ml  Net    230 ml    General: alert, well-developed, and cooperative to examination.  Lungs: CTAB, no wheezes or crackles Heart: normal rate, regular rhythm, no murmur, no gallop, and no rub.  Abdomen: soft, non-tender, normal bowel sounds, no distention, no guarding, no rebound tenderness Pulses: 2+ DP/PT pulses bilaterally Extremities: No cyanosis, clubbing, edema GU: Large serous draining lesions on left scrotum with largest 4 cm in length on upper left scrotum with smaller lesion below. Left gluteal fold with multiple I&D sites, the largest with wicking in place. Serosanguinous discharge on gauze padding. No visible pus.  Skin: R axilla with multiple indurated lesions with small lesion draining purulent fluid Psych: normal mood and affect  Medications: I have reviewed the patient's current medications. Scheduled Meds: . enoxaparin (LOVENOX) injection  40 mg Subcutaneous Q24H  . piperacillin-tazobactam (ZOSYN)  IV  3.375 g Intravenous Q8H  . vancomycin  1,000 mg Intravenous Q8H   Continuous Infusions: . lactated ringers Stopped (12/07/15 1443)   PRN Meds:.morphine injection, oxyCODONE-acetaminophen Assessment/Plan:  Gluteal abscess / worsening hidradenitis suppurativa: s/p I&D of left gluteal abscess on 5/17. Pain and symptoms much improved. Hgb stable this morning,  9.0 >9.7>9.0. Superficial wound culture (scrotral lesion) from admission still pending. Cultures from abscess with abundant WBC but no organisms seen on gram stain. 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 Given patient's new abscess while on Clindamycin orally and topically as an outpatient, will start patient on Bactrim today. Anticipate discharge tomorrow.  -Vancomycin 5/15>>>5/18 -Zosyn 5/15>>>5/18 -Start Bactrim  -Superficial wound Cx pending -F/U abscess cutlures -Oxycodone prn -Morphine prn -Repeat AM CBC, Bmet -Surgical consult, appreciate help  Anemia: Hgb 9.0 > 9.7 > 9.0. Stable from previous admission with 8.7. Felt to be iron deficiency anemia at that time with MCV 63. Total iron was low but ferritin and TIBC not that abnormal. Ferrtin may not have been accurate given ongoing infection. MCV remains low at 63, RDW 17.2. Unclear if this is truly iron deficiency anemia given equivocal iron studies. Suspect this may be an underlying minor thalassemia. -Hgb electrophoresis  -monitor CBC  FULL CODE  Diet: Regular  VTE ppx: [place] enoxaparin  Dispo: Anticipate discharge tomorrow  The patient does have a current PCP (Dan ShaggyEnobong Amao, MD) and [facility].  The patient does have transportation limitations that hinder transportation to clinic appointments.  .Services Needed at time of discharge: Y = Yes, Blank = No PT:   OT:   RN:   Equipment:   Other:     LOS: 3 days   Valentino NoseNathan Shneur [place], MD IMTS PGY-1 [phone] 12/08/2015, 9:42 AM

## 2015-12-08 NOTE — Progress Notes (Signed)
1 Day Post-Op  Subjective: Complains of pain but feels much better than prior to surgery  Objective: Vital signs in last 24 hours: Temp:  [97.9 F (36.6 C)-98.3 F (36.8 C)] 98.2 F (36.8 C) (05/18 0505) Pulse Rate:  [54-85] 58 (05/18 0505) Resp:  [11-19] 19 (05/18 0505) BP: (104-139)/(64-93) 113/73 mmHg (05/18 0505) SpO2:  [97 %-100 %] 100 % (05/18 0505) Last BM Date: 12/06/15  Intake/Output from previous day: 05/17 0701 - 05/18 0700 In: 1740 [P.O.:240; I.V.:1000; IV Piggyback:500] Out: 1910 [Urine:1900; Blood:10] Intake/Output this shift:    Resp: clear to auscultation bilaterally Cardio: regular rate and rhythm GI: soft, non-tender; bowel sounds normal; no masses,  no organomegaly  Lab Results:   Recent Labs  12/07/15 0752 12/08/15 0642  WBC 9.1 9.4  HGB 9.7* 9.0*  HCT 32.2* 29.2*  PLT 341 353   BMET  Recent Labs  12/07/15 0752 12/08/15 0642  NA 139 137  K 3.9 4.2  CL 101 101  CO2 26 26  GLUCOSE 85 119*  BUN <5* 8  CREATININE 1.06 0.98  CALCIUM 8.9 8.6*   PT/INR No results for input(s): LABPROT, INR in the last 72 hours. ABG No results for input(s): PHART, HCO3 in the last 72 hours.  Invalid input(s): PCO2, PO2  Studies/Results: No results found.  Anti-infectives: Anti-infectives    Start     Dose/Rate Route Frequency Ordered Stop   12/06/15 0200  vancomycin (VANCOCIN) IVPB 1000 mg/200 mL premix     1,000 mg 200 mL/hr over 60 Minutes Intravenous Every 8 hours 12/06/15 0054     12/06/15 0045  piperacillin-tazobactam (ZOSYN) IVPB 3.375 g     3.375 g 12.5 mL/hr over 240 Minutes Intravenous Every 8 hours 12/06/15 0033     12/06/15 0045  vancomycin (VANCOCIN) IVPB 1000 mg/200 mL premix  Status:  Discontinued     1,000 mg 200 mL/hr over 60 Minutes Intravenous Every 12 hours 12/06/15 0033 12/06/15 0035      Assessment/Plan: s/p Procedure(s): INCISION AND DRAINAGE PERINIUM AND BUTTOCK (N/A) Continue vanc/zosyn  Will check wound at dressing  change  LOS: 3 days    TOTH III,Maeli Spacek S 12/08/2015

## 2015-12-08 NOTE — Progress Notes (Signed)
1 Day Post-Op  Subjective: Says he feels much better.  He can shower, cultures are pending.  He got his shower and the dressing came out easily.  Unfortunately he started bleeding heavily from the mid lateral and possibly the lower site.  It took about an hour of pressure and thrombi pad to control the bleeding.  Site are very clean and well perfused.    Objective: Vital signs in last 24 hours: Temp:  [97.9 F (36.6 C)-98.3 F (36.8 C)] 98.2 F (36.8 C) (05/18 0505) Pulse Rate:  [54-85] 58 (05/18 0505) Resp:  [11-19] 19 (05/18 0505) BP: (104-139)/(64-93) 113/73 mmHg (05/18 0505) SpO2:  [97 %-100 %] 100 % (05/18 0505) Last BM Date: 12/06/15 Afebrile, VSS Labs show some anemia, other wise stable. Intake/Output from previous day: 05/17 0701 - 05/18 0700 In: 1740 [P.O.:240; I.V.:1000; IV Piggyback:500] Out: 1910 [Urine:1900; Blood:10] Intake/Output this shift:    General appearance: alert, cooperative, mild distress and with heavy bleeding from incision sites. Skin: he has 3 sites that were open.  the mid right buttocks site is fine.  The upper right incision in the gluteal fold was bleeding heavily from a site at the base.  It took a long time to get supplies not on the floor to control this.  We got "thrombi pad," which I cut up and placed in the wound.  The continued pressure to the site to along with the "thrombi pad," controled the  bleeding.  there was also bleeding from the lower mid gluteal site.  I also applied pressure and thrombi pad in these areas.  I could not really tell if the bleeding was also from this site or just running down from the upper site.  All this was controlled and the site was dressed dry.    Lab Results:   Recent Labs  12/07/15 0752 12/08/15 0642  WBC 9.1 9.4  HGB 9.7* 9.0*  HCT 32.2* 29.2*  PLT 341 353    BMET  Recent Labs  12/07/15 0752 12/08/15 0642  NA 139 137  K 3.9 4.2  CL 101 101  CO2 26 26  GLUCOSE 85 119*  BUN <5* 8  CREATININE  1.06 0.98  CALCIUM 8.9 8.6*   PT/INR No results for input(s): LABPROT, INR in the last 72 hours.  No results for input(s): AST, ALT, ALKPHOS, BILITOT, PROT, ALBUMIN in the last 168 hours.   Lipase  No results found for: LIPASE   Studies/Results: No results found.  Medications: . enoxaparin (LOVENOX) injection  40 mg Subcutaneous Q24H  . piperacillin-tazobactam (ZOSYN)  IV  3.375 g Intravenous Q8H  . vancomycin  1,000 mg Intravenous Q8H    Assessment/Plan Chronic severe hidradenitis suppurativa - right axilla, perineum, groin, scrotum and buttocks Penile and scrotal swelling secondary to infectious/inflammatory process FEN:  Regular diet ID: Day 4 Zosyn and vancomycin VTE: SCD/Lovenox   Plan:  I am going to leave the dressing in tonight.  I will have them sit him in a Sitz bath tomorrow and let all of this soak off.  If he has bleeding again he has Siver nitrate stick, thrombi pad, and Surgicel that can be cut and placed into the site.      LOS: 3 days    Ripley Lovecchio 12/08/2015 [phone]

## 2015-12-08 NOTE — Progress Notes (Signed)
Pharmacist Student Provided - Patient Medication Education Education Best boyof B1 [employer] Team Teaching Service Patient  Education: Educated the patient on some of current medications, specifically vancomycin, zosyn, and lovenox. Reviewed side effects with the patient. Answered all of the patient's medication specific questions. These medications are on the patient's current med list and are subject to change upon discharge.  Delmar LandauJonathan Ryliegh Mcduffey P4 PharmD Candidate

## 2015-12-09 LAB — BASIC METABOLIC PANEL
Anion gap: 11 (ref 5–15)
BUN: 6 mg/dL (ref 6–20)
CHLORIDE: 103 mmol/L (ref 101–111)
CO2: 26 mmol/L (ref 22–32)
CREATININE: 1.07 mg/dL (ref 0.61–1.24)
Calcium: 8.8 mg/dL — ABNORMAL LOW (ref 8.9–10.3)
Glucose, Bld: 106 mg/dL — ABNORMAL HIGH (ref 65–99)
Potassium: 3.4 mmol/L — ABNORMAL LOW (ref 3.5–5.1)
SODIUM: 140 mmol/L (ref 135–145)

## 2015-12-09 LAB — HEMOGLOBINOPATHY EVALUATION
HGB A2 QUANT: 2.1 % (ref 0.7–3.1)
HGB A: 97.9 % (ref 94.0–98.0)
HGB C: 0 %
HGB F QUANT: 0 % (ref 0.0–2.0)
Hgb S Quant: 0 %

## 2015-12-09 LAB — CBC
HCT: 29.6 % — ABNORMAL LOW (ref 39.0–52.0)
HEMOGLOBIN: 8.9 g/dL — AB (ref 13.0–17.0)
MCH: 18.9 pg — AB (ref 26.0–34.0)
MCHC: 30.1 g/dL (ref 30.0–36.0)
MCV: 62.7 fL — ABNORMAL LOW (ref 78.0–100.0)
PLATELETS: 347 10*3/uL (ref 150–400)
RBC: 4.72 MIL/uL (ref 4.22–5.81)
RDW: 17 % — ABNORMAL HIGH (ref 11.5–15.5)
WBC: 9.9 10*3/uL (ref 4.0–10.5)

## 2015-12-09 LAB — WOUND CULTURE

## 2015-12-09 MED ORDER — OXYCODONE-ACETAMINOPHEN 5-325 MG PO TABS: 1.0000 | ORAL_TABLET | Freq: Four times a day (QID) | ORAL | Status: DC | PRN

## 2015-12-09 MED ORDER — POTASSIUM CHLORIDE CRYS ER 20 MEQ PO TBCR
40.0000 meq | EXTENDED_RELEASE_TABLET | Freq: Once | ORAL | Status: AC
  Administered 2015-12-09: 40 meq via ORAL
  Filled 2015-12-09: qty 2

## 2015-12-09 MED ORDER — OXYCODONE-ACETAMINOPHEN 10-325 MG PO TABS: 1.0000 | ORAL_TABLET | Freq: Four times a day (QID) | ORAL | Status: AC | PRN

## 2015-12-09 MED ORDER — SULFAMETHOXAZOLE-TRIMETHOPRIM 800-160 MG PO TABS: 2.0000 | ORAL_TABLET | Freq: Two times a day (BID) | ORAL | Status: AC

## 2015-12-09 NOTE — Progress Notes (Signed)
Pt discharged to home.  Discharge instructions explained to pt. Pt has no questions at the time of discharge.  Pt states he has all belongings.  IV's removed.  Pt ambulated off unit on his own.

## 2015-12-09 NOTE — Discharge Summary (Signed)
Name: Dan Koch MRN: [id number] DOB: 1981-02-07 35 y.o. PCP: Jaclyn Shaggy, MD  Date of Admission: 12/05/2015 10:02 PM Date of Discharge: 12/09/2015 Attending Physician: Inez Catalina, MD  Discharge Diagnosis: Principal Problem:   Gluteal abscess Active Problems:   Cellulitis of scrotum   Hidradenitis suppurativa   Anemia of chronic disease   Scrotal abscess  Discharge Medications:   Medication List    STOP taking these medications        clindamycin 300 MG capsule  Commonly known as:  CLEOCIN      TAKE these medications        CENTRUM MEN Tabs  Take 1 tablet by mouth 2 (two) times daily.     chlorhexidine 4 % external liquid  Commonly known as:  HIBICLENS  Apply topically daily as needed.     CLEOCIN-T 1 % external solution  Generic drug:  clindamycin  Apply twice daily to affected areas. Dispense with 6 refills.     ferrous sulfate 325 (65 FE) MG tablet  Take 1 tablet (325 mg total) by mouth daily with breakfast.     ibuprofen 200 MG tablet  Commonly known as:  ADVIL,MOTRIN  Take 1,600 mg by mouth 4 (four) times daily as needed (pain).     ibuprofen 600 MG tablet  Commonly known as:  ADVIL,MOTRIN  Take 1 tablet (600 mg total) by mouth 4 (four) times daily.     oxyCODONE-acetaminophen 10-325 MG tablet  Commonly known as:  PERCOCET  Take 1 tablet by mouth every 6 (six) hours as needed for pain.     sulfamethoxazole-trimethoprim 800-160 MG tablet  Commonly known as:  BACTRIM DS,SEPTRA DS  Take 2 tablets by mouth every 12 (twelve) hours.     TYLENOL PO  Take 4 tablets by mouth every 6 (six) hours as needed (pain).     vitamin C 1000 MG tablet  Take 1,000 mg by mouth 2 (two) times daily.        Disposition and follow-up:   Dan Koch was discharged from [facility] in Good condition.  At the hospital follow up visit please address:  1. Gluteal abscess / worsening hidradenitis suppurativa: s/p I&D.  Started on Bactrim. Has follow up with Jamelle Haring on 6/5, please ensure patient will be able to make appointment (reports car issues).  2.  Labs / imaging needed at time of follow-up: None  3.  Pending labs/ test needing follow-up: None  Follow-up Appointments: Follow-up Information    Follow up with Ruben Im, MD On 12/14/2015.   Specialty:  Internal Medicine   Why:  815 am   Contact information:   [address] [place] [address] [phone]       Follow up with Leonette Nutting, MD On 12/26/2015.   Specialty:  Dermatology   Why:  9:00 am   Contact information:   [address] [place] [address] [phone]       Discharge Instructions: Discharge Instructions    Diet - low sodium heart healthy    Complete by:  As directed      Increase activity slowly    Complete by:  As directed            Consultations: Treatment Team:  Md Ccs, MD  Procedures Performed:  Ct Pelvis W Contrast  12/05/2015  CLINICAL DATA:  Scrotal swelling and penile pain, initial encounter EXAM: CT PELVIS WITH CONTRAST TECHNIQUE: Multidetector CT imaging of  the pelvis was performed using the standard protocol following the bolus administration of intravenous contrast. CONTRAST:  100 mL ISOVUE-300 IOPAMIDOL (ISOVUE-300) INJECTION 61% COMPARISON:  11/15/2015 FINDINGS: Bladder is well distended. The intrapelvic structures are within normal limits. There is again noted thickening of the gluteal cleft particularly on the left with extension into the left buttocks. Some small fluid collections are noted in this region but a dominant collection is noted inferiorly which measures 6.7 cm in greatest dimension. This is an increase from the prior exam a which time it measured 4.1 cm in greatest dimension. Diffuse small subcutaneous fluid collections are noted on the left relatively stable from the prior exam. Some mild scrotal edema is seen. Some soft tissue swelling is noted along the left inguinal  region as well. This is stable from the prior exam. Reactive lymphadenopathy is noted in the inguinal regions bilaterally relatively stable from the prior study. No acute bony abnormality is noted. IMPRESSION: The overall appearance of the skin thickening and subcutaneous fluid collections is stable with the exception of the most inferior/dominant collections along the inferomedial left buttock. It has increased in size as described. Electronically Signed   By: Alcide Clever M.D.   On: 12/05/2015 14:50   Ct Abdomen Pelvis W Contrast  11/15/2015  CLINICAL DATA:  Left scrotal abscess EXAM: CT ABDOMEN AND PELVIS WITH CONTRAST TECHNIQUE: Multidetector CT imaging of the abdomen and pelvis was performed using the standard protocol following bolus administration of intravenous contrast. CONTRAST:  75mL ISOVUE-300 IOPAMIDOL (ISOVUE-300) INJECTION 61% COMPARISON:  05/06/2015 FINDINGS: Lower chest:  Lung bases are unremarkable. Hepatobiliary: No focal hepatic mass. No calcified gallstones are noted within gallbladder. Pancreas: No mass, inflammatory changes, or other significant abnormality. Spleen: Within normal limits in size and appearance. Adrenals/Urinary Tract: No adrenal gland mass is noted. Enhanced kidneys are symmetrical in size. Left extrarenal pelvis again noted. No hydronephrosis or hydroureter. Mild distended urinary bladder without bladder filling defects or calculi. Stomach/Bowel: No gastric outlet obstruction. No thickened or dilated small bowel loops. No small bowel obstruction. No pericecal inflammation. Normal appendix is noted in axial image 59. Moderate stool noted in right colon transverse colon. Some colonic stool noted in descending colon. No distal colonic obstruction. No evidence of colitis or diverticulitis. Vascular/Lymphatic: No aortic aneurysm. No retroperitoneal or mesenteric adenopathy. Mild enlarged right external iliac lymph node measures 1.2 cm in diameter without significant change from  prior exam. Borderline left external iliac lymph node stable from prior exam. Right inguinal lymph node measures 1.2 cm short-axis probable reactive. Left inguinal lymph node measures 1.2 cm short-axis probable reactive. Reproductive: Prostate gland and seminal vesicles are unremarkable. Other: Again noted significant skin thickening at gluteal crease/cleft bilaterally. There is also skin thickening along the perineum. Findings are consistent with extensive dermatitis or cellulitis. There is a superficial subcutaneous abscess axial image 85 in left medial gluteal region measures 1.6 cm. Somewhat elongated subcutaneous superficial fluid collection in left gluteal region axial image 98 measures 1.6 cm. There is a third confluent superficial fluid collection left gluteal region axial image 104 measures 3.2 cm. Findings are highly suspicious for multiple superficial abscesses. There is also skin thickening about the base of the scrotum without significant change from prior exam. There is thickening of scrotal wall and mild subcutaneous edema. There is small amount of fluid and subcutaneous edema anterior perineum base of the scrotum without definite evidence of a confluent scrotal abscess. There is also skin thickening in anterior pelvic wall just above the  penis. Please see axial image 78. No definite evidence of perianal or perirectal abscess. Small superficial subcutaneous fluid collection right gluteal cleft medially axial image 105 and measures 2 cm suspicious for tiny superficial abscess. Musculoskeletal: No destructive bony lesions are noted. Sagittal images of the spine are unremarkable. IMPRESSION: 1. Again noted significant skin thickening at gluteal crease/cleft bilaterally. There is also skin thickening along the perineum. Progression of skin thickening in left gluteal region. Findings are consistent with extensive dermatitis or cellulitis. There is a superficial subcutaneous abscess axial image 85 in left  medial gluteal region measures 1.6 cm. Somewhat elongated subcutaneous superficial fluid collection in left gluteal region axial image 98 measures 1.6 cm. There is a third confluent superficial fluid collection left gluteal region axial image 104 measures 3.2 cm. Findings are highly suspicious for multiple superficial abscesses. There is also skin thickening about the base of the scrotum without significant change from prior exam. There is thickening of scrotal wall and mild subcutaneous edema. There is small amount of fluid and subcutaneous edema anterior perineum base of the scrotum without definite evidence of a confluent scrotal abscess. There is also skin thickening in anterior pelvic wall just above the penis. Please see axial image 78. No definite evidence of perianal or perirectal abscess. 2. Again noted bilateral external iliac and inguinal probable or reactive adenopathy. 3. No small bowel or colonic obstruction. No pericecal inflammation. Normal appendix. Moderate stool noted in right colon and transverse colon. 4. No hydronephrosis or hydroureter. Electronically Signed   By: Natasha Mead M.D.   On: 11/15/2015 09:39    Admission HPI: 35 y/o man with hidradenitis suppurativa since teenage years affecting his groin and R axilla who was discharged from this hospital on 4/27 on clindamycin for cellulitis of the affected areas now presents with worsening pain and swelling of the scrotum, penis, and left gluteal fold since last night. He noticed corresponding increase in drainage some serous some purulent from these areas. This pain was previously stable every day since his hospitalization. He has been feeling sweaty at home but denies chills or shivering and did not check his temperature. He denies any nausea, vomiting, abdominal pain or dysuria. About 6 weeks ago he was [place] in [place] where he underwent I&D of his R axilla and L gluteal abscesses.  Of note he was recommended to follow up with [facility]  dermatology on 6/5 which has not happened yet. He was also recommended to see Dr. Earnest Conroy at [facility] on 5/4 which he reports missing due to car trouble and taking care of his son(?)  Hospital Course by problem list:  Gluteal abscess / worsening hidradenitis suppurativa: Left gluteal lesion increased in size compared to at recent evaluation, draining purulent appearing contents, and very painful on examination. He may have developed a more significant infection of this chronic process. Increased pain and drainage somewhat more prominent than at other sites. CT demonstrating previously known small subcutaneous edema and scrotal edema, with L gluteal cleft progressive compared to prior study. He had been on clinda for weeks with stable symptoms. Derm appointment remains pending in June and [facility] follow up was missed 1.5 weeks ago. Started on Vancomcyin and Zosyn. Underwent I&D of gluteal/perineal abscess on 5/17. Transitioned to high dose Bactrim following I&D. Superficial wound culture growing GBS. Abscess cultures with no growth. Discharged on Bactrim (hx of MRSA on prior cultures). Has follow up with [facility] on 6/5.   Anemia: Hgb 10.1 on admission, improved from previous admission with  8.7. Felt to be iron deficiency anemia at that time with MCV 63. Total iron was low but ferritin and TIBC not that abnormal. He was discharged with prescription for iron supplementation 325mg  daily but did not take this and was not aware of it. Hgb Electrophoresis was done and unremarkable. Hgb was stable around 9 during hospitalization. Discharged with Fe Rx.  Discharge Vitals:   BP 148/78 mmHg  Pulse 90  Temp(Src) 98.2 F (36.8 C) (Oral)  Resp 18  SpO2 97%  Discharge Labs:  No results found for this or any previous visit (from the past 24 hour(s)).  Signed: Valentino NoseNathan Masen Salvas, MD 12/11/2015, 5:48 PM    [facility]

## 2015-12-09 NOTE — Discharge Instructions (Addendum)
Follow up with SAYED, Canary BrimHRISTOPHER J, MD. Go on 12/26/2015.    Specialty: Dermatology   Why: Follow-up with Dr. Jarome Matinhris Sayed at the [facility] ([address]; [place], [place], on the 2nd floor) on June 5 at [employer]9:00am   Contact information:   [address] [id number] [place] [id number] [phone]        Mr. Pharo, we are giving you a prescription for a new antibiotic called Bactrim. Stop taking the oral clindamycin you have at home and take this antibiotic instead. Home health will come and help you change your wound dressings a couple of times a week.   Change your wound dressings daily. You do not have to repack the gauze. It is okay to get the area wet, just dry the area well after showering and reapply the dressing.   We have you scheduled for a follow up appointment in our clinic on 5/24 at 815 AM with Dr. Ala DachFord.  You also have [facility] on 6/5 which will be very important for you to follow up with them.  If you have any questions please call our clinic at [phone]

## 2015-12-09 NOTE — Care Management Note (Signed)
Case Management Note  Patient Details  Name: Dan PalmsSamuel Baker Koch MRN: [id number] Date of Birth: 10/31/1980  Subjective/Objective:                    Action/Plan:  Confirmed face sheet information with patient . Patient states he is already active with [facility] ( information provided) .   Patient just recently had a MATCH letter ( medication assistance ) which is once per calender year . Patient discharging on PO antibiotics , another letter given and explained . 14 day exception entered for Percocet .   Explained home health to patient . Patient aware HHRN will not be there everytime the dressings need to be changed .   He is aware he needs to find assistance for dressing changes.   Patient wanting CM to apply for disability for him. Explained he has to go to DSS on [address] . Patient voiced understanding.  Expected Discharge Date:                  Expected Discharge Plan:  Home w Home Health Services  In-House Referral:  Financial Counselor  Discharge planning Services  CM Consult, [facility]igent Health Clinic, [facility]  Post Acute Care Choice:  Home Health Choice offered to:  Patient  DME Arranged:    DME Agency:     HH Arranged:  RN HH Agency:  [facility]  Status of Service:  Completed, signed off  Medicare Important Message Given:    Date Medicare IM Given:    Medicare IM give by:    Date Additional Medicare IM Given:    Additional Medicare Important Message give by:     If discussed at Long Length of Stay Meetings, dates discussed:    Additional Comments:  Kingsley PlanWile, [name] Marie, RN 12/09/2015, 10:46 AM

## 2015-12-09 NOTE — Progress Notes (Signed)
   Subjective: Dan Koch reports doing the same today as yesterday. Pain is improved but still there. No complaints this morning. Concerned about wound changing at home.   Objective: Vital signs in last 24 hours: Filed Vitals:   12/08/15 0505 12/08/15 1455 12/08/15 2204 12/09/15 0500  BP: 113/73 129/82 117/73 125/69  Pulse: 58 75 73 76  Temp: 98.2 F (36.8 C) 98.5 F (36.9 C) 98.2 F (36.8 C) 97.8 F (36.6 C)  TempSrc:  Oral Oral Oral  Resp: 19 18  18   SpO2: 100% 100% 100% 100%   Weight change:   Intake/Output Summary (Last 24 hours) at 12/09/15 1257 Last data filed at 12/09/15 0948  Gross per 24 hour  Intake    650 ml  Output   1125 ml  Net   -475 ml    General: alert, well-developed, and cooperative to examination.  Lungs: CTAB, no wheezes or crackles Heart: normal rate, regular rhythm, no murmur, no gallop, and no rub.  Abdomen: soft, non-tender, normal bowel sounds, no distention, no guarding, no rebound tenderness Pulses: 2+ DP/PT pulses bilaterally Extremities: No cyanosis, clubbing, edema GU: Large serous draining lesions on left scrotum with largest 4 cm in length on upper left scrotum with smaller lesion below. Left gluteal fold with multiple I&D sites, the largest with wicking in place. Serosanguinous discharge on gauze padding. No visible pus.  Skin: R axilla with multiple indurated lesions with small lesion draining purulent fluid Psych: normal mood and affect  Medications: I have reviewed the patient's current medications. Scheduled Meds: . enoxaparin (LOVENOX) injection  40 mg Subcutaneous Q24H  . sulfamethoxazole-trimethoprim  2 tablet Oral Q12H   Continuous Infusions: . lactated ringers Stopped (12/07/15 1443)   PRN Meds:.morphine injection, oxyCODONE-acetaminophen, silver nitrate applicators Assessment/Plan:  Gluteal abscess / worsening hidradenitis suppurativa: s/p I&D of left gluteal abscess on 5/17. Pain and symptoms much improved. Hgb stable  this morning, 9.0 >9.7>9.0>8.9. Superficial wound culture (scrotral lesion) from admission gowning GBS. Cultures from abscess with no organisms growing. 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 Given patient's new abscess while on Clindamycin orally and topically as an outpatient, will start patient on Bactrim. Anticipate discharge today.  -Vancomycin 5/15>>>5/18 -Zosyn 5/15>>>5/18 -Bactrim 5/18 >> -F/U abscess cutlures -Oxycodone prn -Surgical consult, appreciate help -HH wound care  Anemia: Hgb 9.0 > 9.7 > 9.0>8.9. Stable from previous admission with 8.7. Felt to be iron deficiency anemia at that time with MCV 63. Total iron was low but ferritin and TIBC not that abnormal. Ferrtin may not have been accurate given ongoing infection. MCV remains low at 63, RDW 17.2. Unclear if this is truly iron deficiency anemia given equivocal iron studies. Suspect this may be an underlying minor thalassemia. -Hgb electrophoresis in progress -monitor CBC  FULL CODE  Diet: Regular  VTE ppx: [place] enoxaparin  Dispo: Anticipate discharge today  The patient does have a current PCP (Jaclyn ShaggyEnobong Amao, MD) and [facility] after discharge.  The patient does have transportation limitations that hinder transportation to clinic appointments.  .Services Needed at time of discharge: Y = Yes, Blank = No PT:   OT:   RN:   Equipment:   Other:     LOS: 4 days   Valentino NoseNathan Sharnita Bogucki, MD IMTS PGY-1 [phone] 12/09/2015, 12:56 PM

## 2015-12-09 NOTE — Progress Notes (Signed)
Patient ID: Dan Koch, male   DOB: 06/22/1981, 35 y.o.   MRN: [id number]   LOS: 4 days   POD#2  Subjective: Doing ok, feels about the same as yesterday. Buttock wound throbs when he lies on it.   Objective: Vital signs in last 24 hours: Temp:  [97.8 F (36.6 C)-98.5 F (36.9 C)] 97.8 F (36.6 C) (05/19 0500) Pulse Rate:  [73-76] 76 (05/19 0500) Resp:  [18] 18 (05/19 0500) BP: (117-129)/(69-82) 125/69 mmHg (05/19 0500) SpO2:  [100 %] 100 % (05/19 0500) Last BM Date: 12/07/15   Laboratory  CBC  Recent Labs  12/08/15 0642 12/09/15 0454  WBC 9.4 9.9  HGB 9.0* 8.9*  HCT 29.2* 29.6*  PLT 353 347   BMET  Recent Labs  12/08/15 0642 12/09/15 0454  NA 137 140  K 4.2 3.4*  CL 101 103  CO2 26 26  GLUCOSE 119* 106*  BUN 8 6  CREATININE 0.98 1.07  CALCIUM 8.6* 8.8*    Physical Exam General appearance: alert and no distress Resp: clear to auscultation bilaterally Cardio: regular rate and rhythm GI: normal findings: bowel sounds normal and soft, non-tender Incision/Wound: Wounds are hemostatic today, healing well. Larger ones packed with moistened gauze, smallest with 1/4" iodoform. Pt tolerated very well.   Assessment/Plan: Chronic severe hidradenitis suppurativa - right axilla, perineum, groin, scrotum and buttocks Penile and scrotal swelling secondary to infectious/inflammatory process s/p I&D buttock abscesses x3 5/17  -- Home once wound care and abx particulars can be worked out. He has no one able to help him with packing these wounds. FEN: Regular diet ID: Day 5 Zosyn and vancomycin, cultures still pending VTE: SCD/Lovenox    Dan CaldronMichael J. Darrien Belter, PA-C Pager: [phone] 12/09/2015

## 2015-12-10 LAB — CULTURE, ROUTINE-ABSCESS: CULTURE: NO GROWTH

## 2015-12-11 LAB — ANAEROBIC CULTURE

## 2015-12-12 NOTE — Anesthesia Postprocedure Evaluation (Signed)
Anesthesia Post Note  Patient: Ranae PalmsSamuel Baker Kisner  Procedure(s) Performed: Procedure(s) (LRB): INCISION AND DRAINAGE PERINIUM AND BUTTOCK (N/A)  Patient location during evaluation: PACU Anesthesia Type: General Level of consciousness: awake and alert Pain management: pain level controlled Vital Signs Assessment: post-procedure vital signs reviewed and stable Respiratory status: spontaneous breathing, nonlabored ventilation, respiratory function stable and patient connected to nasal cannula oxygen Cardiovascular status: blood pressure returned to baseline and stable Postop Assessment: no signs of nausea or vomiting Anesthetic complications: no     Last Vitals:  Filed Vitals:   12/09/15 0500 12/09/15 1413  BP: 125/69 148/78  Pulse: 76 90  Temp: 36.6 C 36.8 C  Resp: 18 18    Last Pain:  Filed Vitals:   12/09/15 1840  PainSc: 8    Pain Goal: Patients Stated Pain Goal: 2 (12/08/15 2003)               Kennieth RadFitzgerald, Dyesha Henault E

## 2015-12-13 LAB — WOUND CULTURE: GRAM STAIN: NONE SEEN

## 2015-12-14 ENCOUNTER — Telehealth: Payer: Self-pay | Admitting: Family Medicine

## 2015-12-14 NOTE — Telephone Encounter (Signed)
APT. REMINDER CALL, LMTCB °

## 2015-12-15 ENCOUNTER — Ambulatory Visit: Payer: Self-pay | Admitting: Internal Medicine

## 2015-12-22 ENCOUNTER — Ambulatory Visit: Payer: Self-pay | Admitting: Internal Medicine

## 2017-01-08 IMAGING — CR DG CERVICAL SPINE COMPLETE 4+V
5 series · 5 of 5 positions shown · non-contrast
Comparison: None.

CLINICAL DATA: Neck pain

EXAM:
CERVICAL SPINE  4+ VIEWS

[lpo]
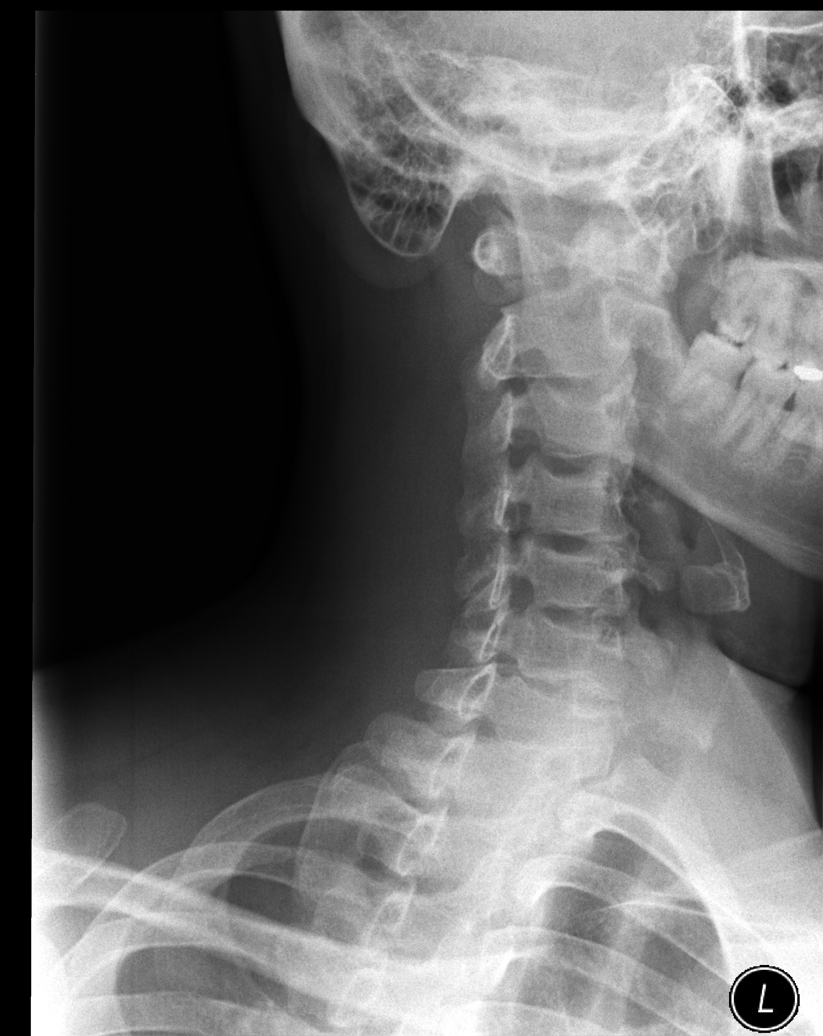

[lateral]
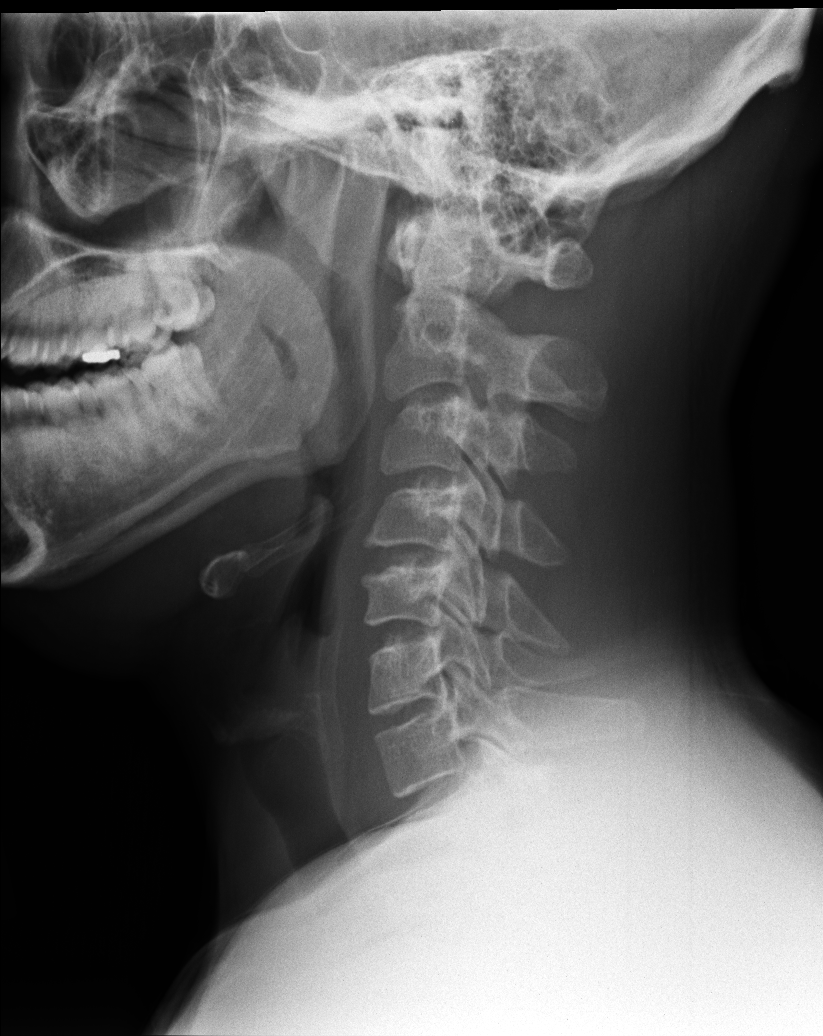

[rpo]
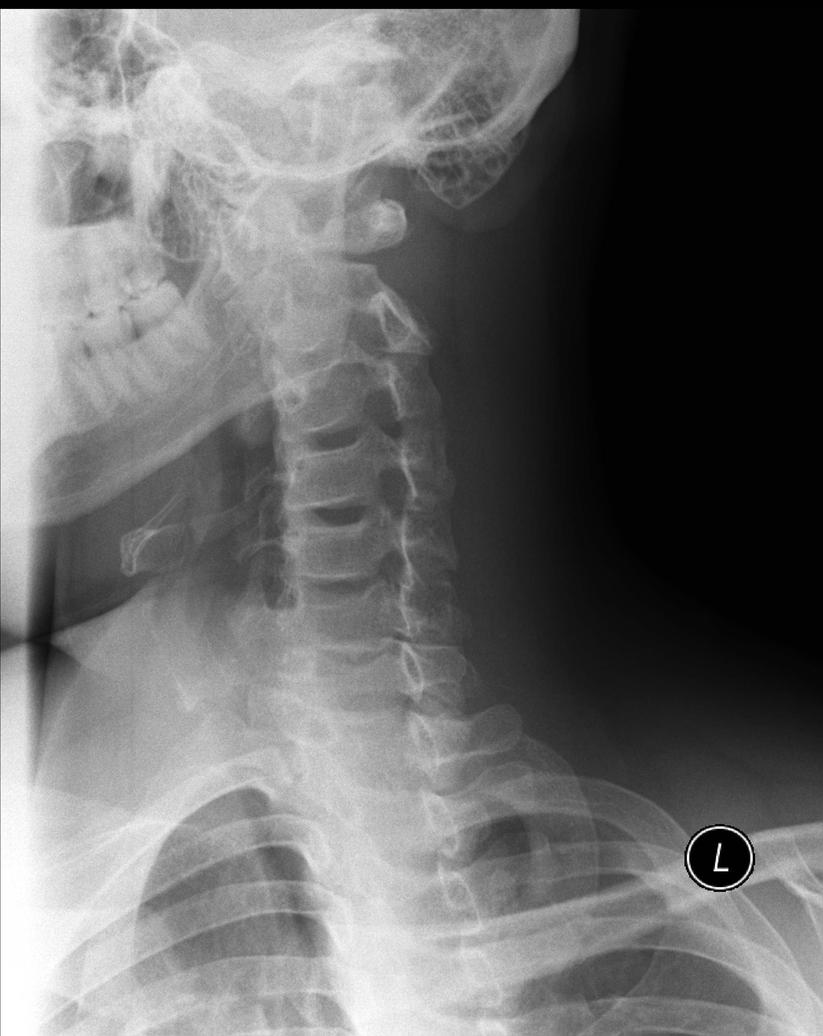

[AP]
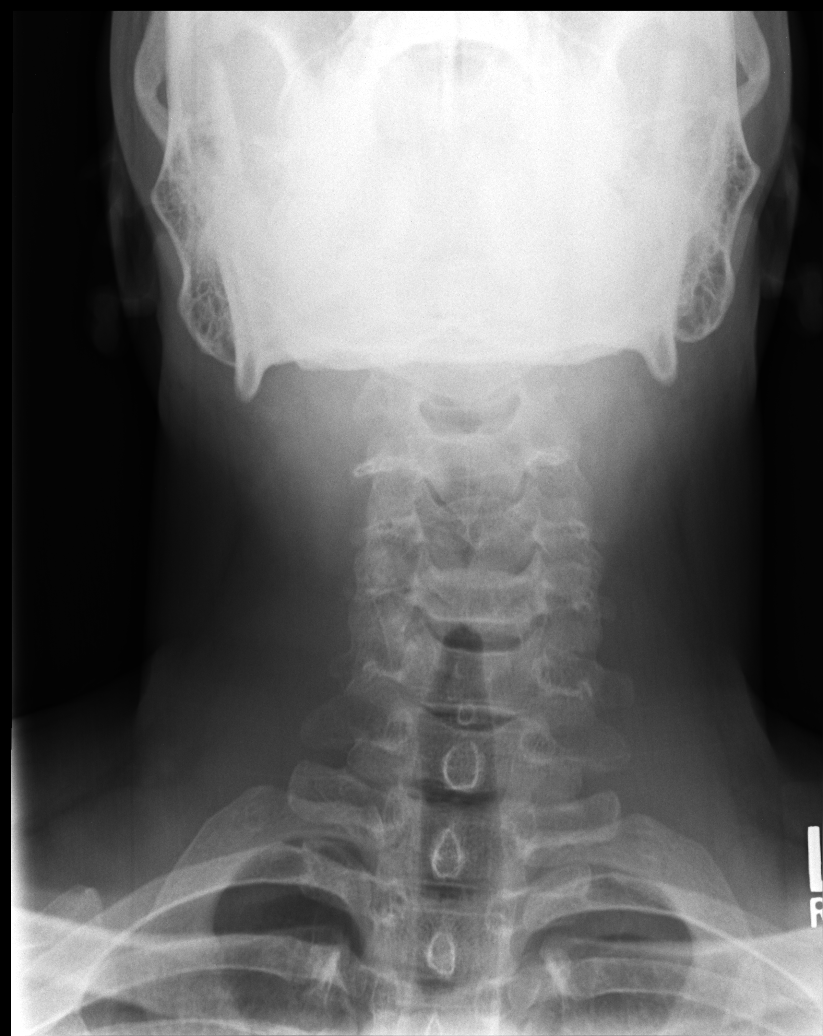

[ap open mouth]
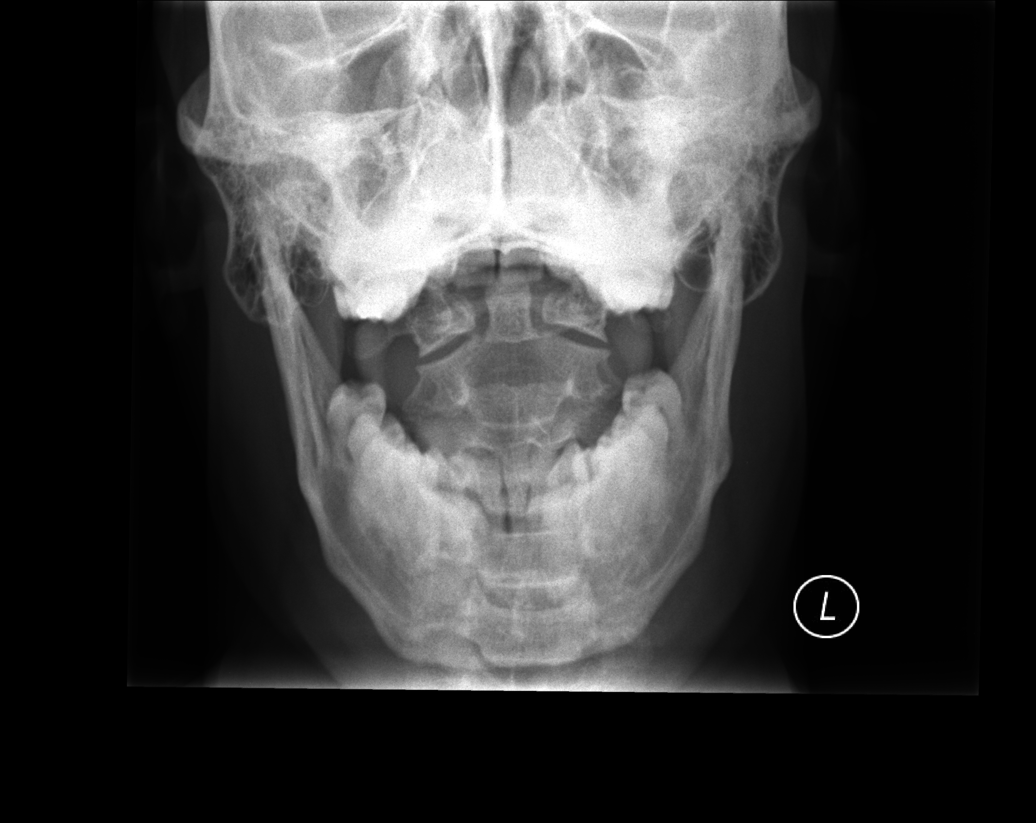

[5 of 5 positions shown; findings below may reference images not displayed]

FINDINGS: Normal alignment with mild straightening of the cervical lordosis.
Negative for fracture or mass

Mild disc degeneration with mild anterior and posterior spurring at
C4-5. Remaining disc spaces intact. No foraminal encroachment
IMPRESSION: Mild disc degeneration C4-5.

## 2017-02-14 ENCOUNTER — Ambulatory Visit
Admission: RE | Admit: 2017-02-14 | Discharge: 2017-03-15 | Disposition: A | Discharge status: Home / Self Care | Payer: MEDICAID

## 2017-02-14 DIAGNOSIS — L732 Hidradenitis suppurativa: Secondary | ICD-10-CM

## 2017-08-29 ENCOUNTER — Encounter (HOSPITAL_COMMUNITY): Payer: Self-pay | Admitting: Family Medicine

## 2017-08-29 ENCOUNTER — Ambulatory Visit (HOSPITAL_COMMUNITY)
Admission: EM | Admit: 2017-08-29 | Discharge: 2017-08-29 | Disposition: A | Discharge status: Home / Self Care | Payer: Medicaid Other

## 2017-08-29 ENCOUNTER — Ambulatory Visit (HOSPITAL_COMMUNITY)
Admission: EM | Admit: 2017-08-29 | Discharge: 2017-08-29 | Disposition: A | Discharge status: Home / Self Care | Payer: Medicaid Other | Attending: Family Medicine | Admitting: Family Medicine

## 2017-08-29 DIAGNOSIS — N492 Inflammatory disorders of scrotum: Secondary | ICD-10-CM

## 2017-08-29 NOTE — ED Notes (Signed)
Pt called x 3 with no answer

## 2017-08-29 NOTE — ED Provider Notes (Signed)
[facility]    CSN: [id number] Arrival date & time: 08/29/17  1614     History   Chief Complaint Chief Complaint  Patient presents with  . Abscess    HPI Dan Koch is a 37 y.o. male.   37 year-old male, with history of hidradenitis, presenting today due to abscess to his scrotum. He states that this started 2-3 days ago. Drainage purulent drainage last night. No drainage throughout the day today Patient states that this is a recurrent issue for him.  On chart review, it appears that he was taken to the operating room for a similar problem in the past.  He has been febrile.  States that he is unable to sit due to the pain   The history is provided by the patient.  Testicle Pain  This is a new problem. The current episode started 2 days ago. The problem occurs constantly. The problem has been gradually worsening. Pertinent negatives include no chest pain, no abdominal pain, no headaches and no shortness of breath. The symptoms are aggravated by walking (sitting ). Nothing relieves the symptoms. He has tried nothing for the symptoms. The treatment provided no relief.    Past Medical History:  Diagnosis Date  . Abscess   . Acne inversa    Hydradenitis suppurativa  . Hydradenitis     Patient Active Problem List   Diagnosis Date Noted  . Scrotal abscess 12/06/2015  . Anemia of chronic disease 12/05/2015  . Cellulitis of scrotum 11/15/2015  . Cellulitis, gluteal 11/15/2015  . Gluteal abscess   . Hidradenitis suppurativa   . MRSA (methicillin resistant staph aureus) culture positive 04/18/2015  . Hydradenitis 04/12/2015    Past Surgical History:  Procedure Laterality Date  . HERNIA REPAIR    . INCISION AND DRAINAGE    . INCISION AND DRAINAGE ABSCESS N/A 12/07/2015   Procedure: INCISION AND DRAINAGE PERINIUM AND BUTTOCK;  Surgeon: Violeta Gelinas, MD;  Location: [facility];  Service: General;  Laterality: N/A;       Home Medications    Prior to  Admission medications   Medication Sig Start Date End Date Taking? Authorizing Provider  Acetaminophen (TYLENOL PO) Take 4 tablets by mouth every 6 (six) hours as needed (pain).     [provider]  Ascorbic Acid (VITAMIN C) 1000 MG tablet Take 1,000 mg by mouth 2 (two) times daily.    [provider]  chlorhexidine (HIBICLENS) 4 % external liquid Apply topically daily as needed. Patient not taking: Reported on 11/15/2015 07/20/15   Leta Baptist, MD  CLEOCIN-T 1 % external solution Apply twice daily to affected areas. Dispense with 6 refills. Patient taking differently: Apply 1 application topically 2 (two) times daily. Apply twice daily to affected areas. Dispense with 6 refills. 11/17/15   Selina Cooley, MD  ferrous sulfate 325 (65 FE) MG tablet Take 1 tablet (325 mg total) by mouth daily with breakfast. Patient not taking: Reported on 12/05/2015 11/17/15   Beather Arbour, MD  ibuprofen (ADVIL,MOTRIN) 200 MG tablet Take 1,600 mg by mouth 4 (four) times daily as needed (pain).     [provider]  ibuprofen (ADVIL,MOTRIN) 600 MG tablet Take 1 tablet (600 mg total) by mouth 4 (four) times daily. Patient not taking: Reported on 12/05/2015 11/17/15   Selina Cooley, MD  Multiple Vitamins-Minerals (CENTRUM MEN) TABS Take 1 tablet by mouth 2 (two) times daily.    [provider]  oxyCODONE-acetaminophen (PERCOCET) 10-325 MG  tablet Take 1 tablet by mouth every 6 (six) hours as needed for pain. 12/09/15   Valentino NoseBoswell, Nathan, MD  sulfamethoxazole-trimethoprim (BACTRIM DS,SEPTRA DS) 800-160 MG tablet Take 2 tablets by mouth every 12 (twelve) hours. 12/09/15   Valentino NoseBoswell, Nathan, MD    Family History History reviewed. No pertinent family history.  Social History Social History   Tobacco Use  . Smoking status: Never Smoker  . Smokeless tobacco: Never Used  Substance Use Topics  . Alcohol use: No  . Drug use: No     Allergies   Strawberry extract; Tomato;  Citrus; and Tramadol   Review of Systems Review of Systems  Constitutional: Negative for chills and fever.  HENT: Negative for ear pain and sore throat.   Eyes: Negative for pain and visual disturbance.  Respiratory: Negative for cough and shortness of breath.   Cardiovascular: Negative for chest pain and palpitations.  Gastrointestinal: Negative for abdominal pain and vomiting.  Genitourinary: Positive for scrotal swelling and testicular pain. Negative for dysuria and hematuria.  Musculoskeletal: Negative for arthralgias and back pain.  Skin: Negative for color change and rash.  Neurological: Negative for seizures, syncope and headaches.  All other systems reviewed and are negative.    Physical Exam Triage Vital Signs ED Triage Vitals [08/29/17 1635]  Enc Vitals Group     BP (!) 145/90     Pulse Rate (!) 103     Resp 18     Temp 100.1 F (37.8 C)     Temp src      SpO2 100 %     Weight      Height      Head Circumference      Peak Flow      Pain Score 10     Pain Loc      Pain Edu?      Excl. in GC?    No data found.  Updated Vital Signs BP (!) 145/90   Pulse (!) 103   Temp 100.1 F (37.8 C)   Resp 18   SpO2 100%   Visual Acuity Right Eye Distance:   Left Eye Distance:   Bilateral Distance:    Right Eye Near:   Left Eye Near:    Bilateral Near:     Physical Exam  Constitutional: He appears well-developed and well-nourished.  HENT:  Head: Normocephalic and atraumatic.  Eyes: Conjunctivae are normal.  Neck: Neck supple.  Cardiovascular: Normal rate and regular rhythm.  No murmur heard. Pulmonary/Chest: Effort normal and breath sounds normal. No respiratory distress.  Abdominal: Soft. There is no tenderness.  Genitourinary:  Genitourinary Comments: Edematous scrotum with large area of erythema and induration noted to the inferior left side of the scrotum.  There is 2 open areas noted.  Musculoskeletal: He exhibits no edema.  Neurological: He is  alert.  Skin: Skin is warm and dry.  Psychiatric: He has a normal mood and affect.  Nursing note and vitals reviewed.    UC Treatments / Results  Labs (all labs ordered are listed, but only abnormal results are displayed) Labs Reviewed - No data to display  EKG  EKG Interpretation None       Radiology No results found.  Procedures Procedures (including critical care time)  Medications Ordered in UC Medications - No data to display   Initial Impression / Assessment and Plan / UC Course  I have reviewed the triage vital signs and the nursing notes.  Pertinent labs & imaging results that  were available during my care of the patient were reviewed by me and considered in my medical decision making (see chart for details).     Scrotal abscess with large amount of scrotal edema.  Patient has a history of hidradenitis and is actually been taken to the OR in the past for similar problem.  Patient is febrile with intense pain.  Will be sent to emergency department for imaging and likely surgical consult.  Final Clinical Impressions(s) / UC Diagnoses   Final diagnoses:  Abscess, scrotum    ED Discharge Orders    None       Controlled Substance Prescriptions [place] Controlled Substance Registry consulted? Not Applicable   Alecia Lemming, [place] 08/29/17 1659

## 2017-08-29 NOTE — ED Triage Notes (Signed)
Pt here for abscess to testicles. Reports some drainage. Pt in a lot of pain. Unable to sit.

## 2017-08-29 NOTE — Discharge Instructions (Signed)
You need to be evaluated in the ED for likely imaging of your scrotum and surgical consult

## 2017-11-27 ENCOUNTER — Ambulatory Visit: Admit: 2017-11-27 | Discharge: 2017-11-27 | Disposition: A | Discharge status: Home / Self Care | Payer: MEDICARE

## 2017-11-27 MED ORDER — SULFAMETHOXAZOLE 800 MG-TRIMETHOPRIM 160 MG TABLET
ORAL_TABLET | Freq: Two times a day (BID) | ORAL | 0 refills | 0 days | Status: CP
Start: 2017-11-27 — End: 2017-12-04

## 2017-12-26 ENCOUNTER — Ambulatory Visit: Admit: 2017-12-26 | Discharge: 2017-12-26 | Disposition: A | Discharge status: Home / Self Care | Payer: MEDICARE

## 2017-12-26 MED ORDER — SULFAMETHOXAZOLE 800 MG-TRIMETHOPRIM 160 MG TABLET
ORAL_TABLET | Freq: Two times a day (BID) | ORAL | 0 refills | 0.00000 days | Status: CP
Start: 2017-12-26 — End: 2018-01-05

## 2017-12-26 MED ORDER — SULFAMETHOXAZOLE 800 MG-TRIMETHOPRIM 160 MG TABLET: 1 | tablet | Freq: Two times a day (BID) | 0 refills | 0 days | Status: AC

## 2018-01-04 ENCOUNTER — Ambulatory Visit: Admit: 2018-01-04 | Discharge: 2018-01-04 | Disposition: A | Discharge status: Home / Self Care | Payer: MEDICARE

## 2018-01-04 ENCOUNTER — Emergency Department: Admit: 2018-01-04 | Discharge: 2018-01-04 | Disposition: A | Discharge status: Home / Self Care | Payer: MEDICARE

## 2018-01-04 DIAGNOSIS — R1030 Lower abdominal pain, unspecified: Principal | ICD-10-CM

## 2018-01-09 ENCOUNTER — Ambulatory Visit
Admit: 2018-01-09 | Discharge: 2018-01-09 | Disposition: A | Discharge status: Home / Self Care | Payer: MEDICARE | Attending: Emergency Medicine

## 2018-01-09 ENCOUNTER — Emergency Department
Admit: 2018-01-09 | Discharge: 2018-01-09 | Disposition: A | Discharge status: Home / Self Care | Payer: MEDICARE | Attending: Emergency Medicine

## 2018-01-09 DIAGNOSIS — N5082 Scrotal pain: Secondary | ICD-10-CM

## 2018-01-09 DIAGNOSIS — R1900 Intra-abdominal and pelvic swelling, mass and lump, unspecified site: Principal | ICD-10-CM

## 2018-07-23 ENCOUNTER — Ambulatory Visit
Admit: 2018-07-23 | Discharge: 2018-07-24 | Disposition: A | Discharge status: Home / Self Care | Payer: MEDICARE | Attending: Emergency Medicine

## 2018-07-23 MED ORDER — SULFAMETHOXAZOLE 400 MG-TRIMETHOPRIM 80 MG TABLET
ORAL_TABLET | Freq: Two times a day (BID) | ORAL | 0 refills | 0 days | Status: CP
Start: 2018-07-23 — End: 2018-08-02

## 2018-12-12 ENCOUNTER — Ambulatory Visit: Admit: 2018-12-12 | Discharge: 2018-12-12 | Discharge status: Against Medical Advice | Payer: MEDICARE

## 2018-12-22 ENCOUNTER — Ambulatory Visit: Admit: 2018-12-22 | Discharge: 2018-12-23 | Payer: MEDICARE | Attending: Family | Primary: Family

## 2018-12-22 DIAGNOSIS — L732 Hidradenitis suppurativa: Secondary | ICD-10-CM

## 2018-12-22 DIAGNOSIS — Z7689 Persons encountering health services in other specified circumstances: Principal | ICD-10-CM

## 2018-12-22 MED ORDER — CLINDAMYCIN HCL 300 MG CAPSULE
ORAL_CAPSULE | Freq: Three times a day (TID) | ORAL | 0 refills | 0 days | Status: CP
Start: 2018-12-22 — End: 2019-01-22

## 2019-01-08 ENCOUNTER — Ambulatory Visit: Admit: 2019-01-08 | Discharge: 2019-01-09 | Payer: MEDICARE | Attending: Surgery | Primary: Surgery

## 2019-01-08 DIAGNOSIS — L732 Hidradenitis suppurativa: Principal | ICD-10-CM

## 2019-01-16 ENCOUNTER — Ambulatory Visit
Admit: 2019-01-16 | Discharge: 2019-01-16 | Disposition: A | Discharge status: Home / Self Care | Payer: MEDICARE | Attending: Emergency Medicine

## 2019-01-16 MED ORDER — HYDROCODONE 5 MG-ACETAMINOPHEN 325 MG TABLET
ORAL_TABLET | Freq: Four times a day (QID) | ORAL | 0 refills | 0.00000 days | Status: CP | PRN
Start: 2019-01-16 — End: 2019-01-22

## 2019-01-22 ENCOUNTER — Ambulatory Visit: Admit: 2019-01-22 | Discharge: 2019-01-23 | Payer: MEDICARE | Attending: Family | Primary: Family

## 2019-01-22 DIAGNOSIS — N5082 Scrotal pain: Secondary | ICD-10-CM

## 2019-01-22 DIAGNOSIS — L732 Hidradenitis suppurativa: Principal | ICD-10-CM

## 2019-01-22 DIAGNOSIS — N5089 Other specified disorders of the male genital organs: Secondary | ICD-10-CM

## 2019-02-12 ENCOUNTER — Ambulatory Visit: Admit: 2019-02-12 | Discharge: 2019-02-13 | Payer: MEDICARE | Attending: Urology | Primary: Urology

## 2019-02-12 DIAGNOSIS — N5089 Other specified disorders of the male genital organs: Secondary | ICD-10-CM

## 2019-02-12 DIAGNOSIS — N5082 Scrotal pain: Principal | ICD-10-CM

## 2019-04-30 ENCOUNTER — Ambulatory Visit: Admit: 2019-04-30 | Discharge: 2019-05-01 | Payer: MEDICARE | Attending: Dermatology | Primary: Dermatology

## 2019-04-30 ENCOUNTER — Ambulatory Visit: Admit: 2019-04-30 | Discharge: 2019-05-01 | Payer: MEDICARE

## 2019-04-30 DIAGNOSIS — Z1159 Encounter for screening for other viral diseases: Secondary | ICD-10-CM

## 2019-04-30 DIAGNOSIS — Z79899 Other long term (current) drug therapy: Secondary | ICD-10-CM

## 2019-04-30 DIAGNOSIS — L732 Hidradenitis suppurativa: Secondary | ICD-10-CM

## 2019-04-30 MED ORDER — OXYCODONE ER 20 MG TABLET,CRUSH RESISTANT,EXTENDED RELEASE 12 HR: 20 mg | tablet | Freq: Two times a day (BID) | 0 refills | 3 days | Status: AC

## 2019-04-30 MED ORDER — OXYCODONE 10 MG TABLET
ORAL_TABLET | Freq: Four times a day (QID) | ORAL | 0 refills | 3.00000 days | Status: CP | PRN
Start: 2019-04-30 — End: ?

## 2019-04-30 MED ORDER — OXYCODONE ER 20 MG TABLET,CRUSH RESISTANT,EXTENDED RELEASE 12 HR
ORAL_TABLET | Freq: Two times a day (BID) | ORAL | 0 refills | 3.00000 days | Status: CP
Start: 2019-04-30 — End: 2019-04-30

## 2019-04-30 NOTE — Unmapped (Signed)
ASSESSMENT AND PLAN:    Hidradenitis Suppurative, Hurley Stage III  - Discussed that this is a chronic and progressive disease, discussed management options with patient today  - Although areas appear clinically c/w infectious etiology- abscesses, inflamed nodules are secondary to systemic inflammation and not infectious etiology. This has been seen when sterile cultures return from I&D to nodules/abscesses. I&D and/or debridement are not best management for this procedure in long term as disease continues to progress given underlying sinus tracts  - We treat with short term abx (limited efficacy as disease progresses), immunotherapy, anti androgen therapy, sulfonylurea (metformin) and in office deroofing procedures  - A mutual decision was made to proceed as follows:  - Start Humira 160 mg on day 1 then inject 80 mg on day 15 then inject (40 mg) weekly beginning on day 29, sent to [facility]  - Provided temporary and short term refill of pain medications (oxycodone and oxycodone ER) as patient is pending evaluation with new pain doctor at end of the month. 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 male who is seen today in consultation at the request of Dr Clotilde Dieter by myself for evaluation of hidradenitis suppurativa . He notes history of HS since teenage years (> 20 years ago), which has affected his bilateral axillae, inguinal folds, scrotum, perineum and gluteal skin b/l. He has undergone numerous incision and drainage procedures of all areas with several rounds of po and IV abx requiring ER and hospital admission for management. Per chart review, he has been referred to [facility]) several times but has been unable to follow through with appointments. He notes several year history of scrotal edema and saw [facility] who recommend ultrasound imaging for better characterization. He denies voiding or sexual difficulties due to edema. Interferes with ambulation. He denies history of DMII, family history of HS. Mother passed away from ALS, sister with history of anemia.     Pertinent PMH:   Anemia   HS  Scrotal Edema    Family History: Negative for HS    Social History: Non smoker, lives in [place], [place]  Social History     Tobacco Use   ??? Smoking status: Never Smoker   ??? Smokeless tobacco: Never Used   Substance Use Topics   ??? Alcohol use: No   ??? Drug use: No        ROS: Baseline state of health. No recent illnesses, denies fevers, chills, loss of apetite or weight changes. No other skin complaints except as noted per HPI. The balance of 10 systems reviewed was negative.    PE:  General: Well-developed, well-nourished male in no acute distress, resting comfortably.  Neuro: Alert and oriented, answers questions appropriately.  Skin: Examination of the face, neck, chest, back, abdomen, bilateral upper and lower extremities, palms, soles, and nails was performed and  notable for the following:      - right axillae with 5 non draining sinus tracts; 2 non inflamed nodules   - very prominent, non indurated, scrotal lymphedema with indurated 2 cm hypopigmented sinus tract located on left inferior aspect  - multiple sinus tracts located on bilateral inguinal folds  - 4 sinus tracts with 10 non inflamed nodules throughout inferior aspects of buttocks with extension to perineum; buttocks with generalized fibrotic tissue and induration

## 2019-04-30 NOTE — Unmapped (Signed)
Send note to [facility] pain management

## 2019-05-04 DIAGNOSIS — N5089 Other specified disorders of the male genital organs: Principal | ICD-10-CM

## 2019-05-08 MED ORDER — HUMIRA SYRINGE CITRATE FREE 40 MG/0.4 ML: Syringe | 3 refills | 0 days | Status: AC

## 2019-05-11 DIAGNOSIS — L732 Hidradenitis suppurativa: Principal | ICD-10-CM

## 2019-05-15 NOTE — Unmapped (Signed)
[facility] Specialty Medication Onboarding    Specialty Medication: HUMIRA (CF) SYRINGES  Prior Authorization: Approved   Financial Assistance: No - copay  <$25  Final Copay/Day Supply: $3.90 / 28 DAYS    Insurance Restrictions: None     Notes to Pharmacist:     The triage team has completed the benefits investigation and has determined that the patient is able to fill this medication at [facility]. Please contact the patient to complete the onboarding or follow up with the prescribing physician as needed.

## 2019-05-20 DIAGNOSIS — Z79899 Other long term (current) drug therapy: Principal | ICD-10-CM

## 2019-05-20 NOTE — Unmapped (Signed)
Left voicemail for patient to have Quant Gold drawn at [facility]-not drawn with labs at [facility] which has been an ongoing issue. Informed him that Humira is approved but pending TB test results.

## 2019-06-02 NOTE — Unmapped (Signed)
Oops. Sent it to the wrong Megan[facility].

## 2019-06-02 NOTE — Unmapped (Signed)
Edward Berry left a VM saying he still has not received his medication via mail. I called [employer] at [phone] as it appears the Humira was approved. Transferred to [employer]. Waiting on quantiferon gold.     Called Edward Berry back and asked him if he received the message from Dr. Izora Ribas about the lab draw. He said no. I found him a [facility] in [facility] which he said was 15 minutes from where he lives in [place]. He will go tomorrow or Wednesday. Called [facility] to get a fax number to make sure they had the requisition before Edward Berry goes. They were closed, but I found the fax number on their website. [phone])

## 2019-06-14 LAB — QUANTIFERON TB GOLD PLUS
QUANTIFERON MITOGEN VALUE: >10 [IU]/mL
QUANTIFERON NIL VALUE: 0.03 [IU]/mL
QUANTIFERON TB GOLD: NEGATIVE
QUANTIFERON TB1 AG VALUE: 0.03 [IU]/mL
QUANTIFERON TB2 AG VALUE: 0.03 [IU]/mL

## 2019-06-14 LAB — QUANTIFERON TB1 AG VALUE
Mycobacterium tuberculosis stimulated gamma interferon release by CD4+ T-cells^^corrected for background:ACnc:Pt:Bld:Qn:: 0.03

## 2019-06-15 NOTE — Unmapped (Signed)
Received quantiferon gold and negative

## 2019-06-16 NOTE — Unmapped (Signed)
[facility]   Patient Onboarding/Medication Counseling    Edward Berry is a 38 y.o. male with hidradenitis who I am counseling today on initiation of therapy.  I am speaking to the patient.    Verified patient's date of birth / HIPAA.    Specialty medication(s) to be sent: Inflammatory Disorders: Humira      Non-specialty medications/supplies to be sent: sharps kit      Medications not needed at this time: na         Humira (adalimumab)    Medication & Administration     Dosage: Hidradenitis supprativa: Inject 160mg  under the skin on day 1, 80mg  on day 15, then 40mg  every 7 days starting on day 29    Lab tests required prior to treatment initiation:  ? Tuberculosis: Tuberculosis screening resulted in a non-reactive Quantiferon TB Gold assay.  ? Hepatitis B: Hepatitis B serology studies are complete and non-reactive.    Administration:     Prefilled auto-injector pen  1. Gather all supplies needed for injection on a clean, flat working surface: medication pen removed from packaging, alcohol swab, sharps container, etc.  2. Look at the medication label - look for correct medication, correct dose, and check the expiration date  3. Look at the medication - the liquid visible in the window on the side of the pen device should appear clear and colorless  4. Lay the auto-injector pen on a flat surface and allow it to warm up to room temperature for at least 30-45 minutes  5. Select injection site - you can use the front of your thigh or your belly (but not the area 2 inches around your belly button); if someone else is giving you the injection you can also use your upper arm in the skin covering your triceps muscle  6. Prepare injection site - wash your hands and clean the skin at the injection site with an alcohol swab and let it air dry, do not touch the injection site again before the injection 7. Pull the 2 safety caps straight off - gray/white to uncover the needle cover and the plum cap to uncover the plum activator button, do not remove until immediately prior to injection and do not touch the white needle cover  8. Gently squeeze the area of cleaned skin and hold it firmly to create a firm surface at the selected injection site  9. Put the white needle cover against your skin at the injection site at a 90 degree angle, hold the pen such that you can see the clear medication window  10. Press down and hold the pen firmly against your skin, press the plum activator button to initiate the injection, there will be a click when the injection starts  11. Continue to hold the pen firmly against your skin for about 10-15 seconds - the window will start to turn solid yellow  12. To verify the injection is complete after 10-15 seconds, look and ensure the window is solid yellow and then pull the pen away from your skin  13. Dispose of the used auto-injector pen immediately in your sharps disposal container the needle will be covered automatically  14. If you see any blood at the injection site, press a cotton ball or gauze on the site and maintain pressure until the bleeding stops, do not rub the injection site    Adherence/Missed dose instructions:  If your injection is given more than 3 days after your scheduled injection date ???  consult your pharmacist for additional instructions on how to adjust your dosing schedule.    Goals of Therapy     - Reduce the frequency and severity of new lesions  - Minimize pain and suppuration  - Prevent disease progression and limit scarring  - Maintenance of effective psychosocial functioning    Side Effects & Monitoring Parameters     ? Injection site reaction (redness, irritation, inflammation localized to the site of administration)  ? Signs of a common cold ??? minor sore throat, runny or stuffy nose, etc.  ? Upset stomach  ? Headache The following side effects should be reported to the provider:  ? Signs of a hypersensitivity reaction ??? rash; hives; itching; red, swollen, blistered, or peeling skin; wheezing; tightness in the chest or throat; difficulty breathing, swallowing, or talking; swelling of the mouth, face, lips, tongue, or throat; etc.  ? Reduced immune function ??? report signs of infection such as fever; chills; body aches; very bad sore throat; ear or sinus pain; cough; more sputum or change in color of sputum; pain with passing urine; wound that will not heal, etc.  Also at a slightly higher risk of some malignancies (mainly skin and blood cancers) due to this reduced immune function.  o In the case of signs of infection ??? the patient should hold the next dose of Humira?? and call your primary care provider to ensure adequate medical care.  Treatment may be resumed when infection is treated and patient is asymptomatic.  ? Changes in skin ??? a new growth or lump that forms; changes in shape, size, or color of a previous mole or marking  ? Signs of unexplained bruising or bleeding ??? throwing up blood or emesis that looks like coffee grounds; black, tarry, or bloody stool; etc.  ? Signs of new or worsening heart failure ??? shortness of breath; sudden weight gain; heartbeat that is not normal; swelling in the arms or legs that is new or worse      Contraindications, Warnings, & Precautions     ? Have your bloodwork checked as you have been told by your prescriber  ? Talk with your doctor if you are pregnant, planning to become pregnant, or breastfeeding  ? Discuss the possible need for holding your dose(s) of Humira?? when a planned procedure is scheduled with the prescriber as it may delay healing/recovery timeline       Drug/Food Interactions     ? Medication list reviewed in Epic. The patient was instructed to inform the care team before taking any new medications or supplements. No drug interactions identified. ? Talk with you prescriber or pharmacist before receiving any live vaccinations while taking this medication and after you stop taking it    Storage, Handling Precautions, & Disposal     ? Store this medication in the refrigerator.  Do not freeze  ? If needed, you may store at room temperature for up to 14 days  ? Store in [employer], protected from light  ? Do not shake  ? Dispose of used syringes/pens in a sharps disposal container            Current Medications (including OTC/herbals), Comorbidities and Allergies     Current Outpatient Medications   Medication Sig Dispense Refill   ??? diclofenac (VOLTAREN) 75 MG EC tablet Take 75 mg by mouth daily as needed.     ??? ADALIMUMAB PEN CITRATE FREE 40 MG/0.4 ML Inject the contents of 4 pens (160mg ) under the  skin on day 1, then inject the contents of 2 pens (80mg ) on day 15, then inject the contents of 1 pen (40mg ) weekly beginning day 29. 8 each 3   ??? ibuprofen (ADVIL,MOTRIN) 200 MG tablet Take 1,600 mg by mouth every six (6) hours as needed.      ??? multivitamin (TAB-A-VITE/THERAGRAN) per tablet Take 1 tablet by mouth daily.     ??? oxyCODONE (ROXICODONE) 10 mg immediate release tablet Take 1 tablet (10 mg total) by mouth every six (6) hours as needed for pain for up to 10 doses. 10 tablet 0   ??? oxyCODONE 20 mg TR12 12 hr crush resistant ER/CR tablet Take 1 tablet (20 mg total) by mouth every twelve (12) hours. 6 tablet 0   ??? traZODone (DESYREL) 50 MG tablet Take 50 mg by mouth daily as needed.       No current facility-administered medications for this visit.        Allergies   Allergen Reactions   ??? Citrus And Derivatives Swelling     Tongue and lip swelling   ??? Strawberry Extract Swelling     Tongue and lip swelling   ??? Tomato Swelling     Tongue and lip swelling   ??? Bactrim [Sulfamethoxazole-Trimethoprim] Other (See Comments)     Pt reports negative personality changes- makes him mean, snappy   ??? Tramadol Other (See Comments) Pt reports negative personality changes- makes him mean, snappy       Patient Active Problem List   Diagnosis   ??? Buttock wound   ??? Non-healing left groin open wound   ??? Hidradenitis suppurativa   ??? Abscess of scrotum   ??? Encounter to establish care   ??? Scrotal pain   ??? Scrotal swelling       Reviewed and up to date in Epic.    Appropriateness of Therapy     Is medication and dose appropriate based on diagnosis? Yes    Baseline Quality of Life Assessment      How many days over the past month did your HS keep you from your normal activities? 0    Financial Information     Medication Assistance provided: Prior Authorization    Anticipated copay of $3.90 reviewed with patient. Verified delivery address.    Delivery Information     Scheduled delivery date: Wed, Dec 9    Expected start date: Wed, Dec 9    Medication will be delivered via UPS to the prescription address in [place].  This shipment will not require a signature.      Explained the services we provide at [facility] and that each month we would call to set up refills.  Stressed importance of returning phone calls so that we could ensure they receive their medications in time each month.  Informed patient that we should be setting up refills 7-10 days prior to when they will run out of medication.  A pharmacist will reach out to perform a clinical assessment periodically.  Informed patient that a welcome packet and a drug information handout will be sent.      Patient verbalized understanding of the above information as well as how to contact the pharmacy at [phone] option 4 with any questions/concerns.  The pharmacy is open Monday through Friday 8:30am-4:30pm.  A pharmacist is available 24/7 via pager to answer any clinical questions they may have.    Patient Specific Needs     ? Does the  patient have any physical, cognitive, or cultural barriers? No    ? Patient prefers to have medications discussed with  Patient ? Is the patient able to read and understand education materials at a high school level or above? Yes    ? Patient's primary language is  English     ? Is the patient high risk? No     ? Does the patient require a Care Management Plan? No     ? Does the patient require physician intervention or other additional services (i.e. nutrition, smoking cessation, social work)? No      Mykell Genao A Desiree Lucy Shared [facility] Specialty Pharmacist

## 2019-06-28 NOTE — Unmapped (Deleted)
ASSESSMENT:    DISCUSSION:    PLAN:    ----    REFERRING PROVIDER: ***    HISTORY OF PRESENT ILLNESS:   A 38 y.o.-year-old male seen today in consultation at the request of Dory Horn IV* for ***.    Tereka Thorley pre-charting Notes  ***    02/12/19.  Scrotal edema.  The patient is a 38 year old black male who is had longstanding hidradenitis.  He has had resection of this in the groin region.  He is also had a scrotal abscess resected.  Both of these were done approximately a year ago.  He comes in at this time with scrotal edema and swelling.  This is gone on for the last couple of years.  He does not state that is getting any worse but it does predate some of the surgeries he had for the hidradenitis according to him.  He has no fevers and chills with it.  He has no voiding dysfunction.    The findings are most consistent with scrotal edema, and I am not sure that there will be anything further that can be done.  Even partial resection of the scrotum likely would not resolve the issue as this will tend to stretch out further.  I am going to get an ultrasound to make sure there is no underlying hydrocele that could be repaired and help with the swelling, but I find that likely to not be the case.  I will see him back after the ultrasound.    Scrotal US 01/09/18  1. Normal imaging the testicles bilaterally.  2. Small left epididymal head cyst. Left epididymis itself is not well seen.  3. 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  4. Small right hydrocele.    CT Pelvis 01/04/18  1. Massive scrotal swelling and skin thickening with bilateral hydroceles suspected. Findings are age-indeterminate. Fournier's gangrene is difficult to exclude by CT. No subcutaneous emphysema is noted.  2. Asymmetrically small and heterogeneous right testicle with some rim-enhancing hypodense areas surrounding the left testicle which are nonspecific although could be sterile or infected.   3. Peroneal and left greater than right gluteal skin thickening which extends to abut the anus but without definite discrete perirectal or perianal abscess.  4. Inguinal and right external iliac lymphadenopathy which may be reactive.        Resident/fellow pre-charting and/or visit HPI (document other elements of the note in the appropriate section)  ***    ***[Briefly summarize previous issues, but don't copy paste large amounts of text]  ***[Update interval history in this section]  ***[Update assessment/discussion/plan only after seeing patient]  Interval history: ***      Shifa Brisbon Visit Notes  ***    I review history elements and review of systems on new patient intake form.    PAST MEDICAL HISTORY:   Past Medical History:   Diagnosis Date   ??? Anemia    ??? Anxiety    ??? Depression    ??? Financial difficulties    ??? Gangrene (CMS-HCC)    ??? Hidradenitis suppurativa    ??? Illiteracy and low-level literacy    ??? Impaired mobility    ??? Lack of access to transportation        PAST SURGICAL HISTORY:   Past Surgical History:   Procedure Laterality Date   ??? AXILLARY HIDRADENITIS EXCISION     ??? gangrene      scrotum   ???  PR EXC SKIN BENIG <0.5 CM REMAINDER BODY N/A 01/25/2016    Procedure: EXCISION HYDRADENITIS RIGHT  AXILLA, BILATERAL GROIN, LEFT BUTTOCKS;  Surgeon: Gypsy Lore, MD;  Location: OR [place];  Service: General Surgery   ??? UMBILICAL HERNIA REPAIR         MEDICATIONS:   Current Outpatient Medications   Medication Sig Dispense Refill   ??? diclofenac (VOLTAREN) 75 MG EC tablet Take 75 mg by mouth daily as needed.     ??? ADALIMUMAB PEN CITRATE FREE 40 MG/0.4 ML Inject the contents of 4 pens (160mg ) under the skin on day 1, then inject the contents of 2 pens (80mg ) on day 15, then inject the contents of 1 pen (40mg ) weekly beginning day 29. 8 each 3   ??? ibuprofen (ADVIL,MOTRIN) 200 MG tablet Take 1,600 mg by mouth every six (6) hours as needed.      ??? multivitamin (TAB-A-VITE/THERAGRAN) per tablet Take 1 tablet by mouth daily.     ??? oxyCODONE (ROXICODONE) 10 mg immediate release tablet Take 1 tablet (10 mg total) by mouth every six (6) hours as needed for pain for up to 10 doses. 10 tablet 0   ??? oxyCODONE 20 mg TR12 12 hr crush resistant ER/CR tablet Take 1 tablet (20 mg total) by mouth every twelve (12) hours. 6 tablet 0   ??? traZODone (DESYREL) 50 MG tablet Take 50 mg by mouth daily as needed.       No current facility-administered medications for this visit.        ALLERGIES:   Allergies   Allergen Reactions   ??? Citrus And Derivatives Swelling     Tongue and lip swelling   ??? Strawberry Extract Swelling     Tongue and lip swelling   ??? Tomato Swelling     Tongue and lip swelling   ??? Bactrim [Sulfamethoxazole-Trimethoprim] Other (See Comments)     Pt reports negative personality changes- makes him mean, snappy   ??? Tramadol Other (See Comments)     Pt reports negative personality changes- makes him mean, snappy       FAMILY HISTORY:   Family History   Problem Relation Age of Onset   ??? Arthritis Mother    ??? ALS Mother    ??? No Known Problems Father    ??? Migraines Sister    ??? Anemia Sister    ??? No Known Problems Maternal Grandmother    ??? No Known Problems Maternal Grandfather    ??? Diabetes Paternal Grandmother    ??? Melanoma Neg Hx    ??? Basal cell carcinoma Neg Hx    ??? Squamous cell carcinoma Neg Hx        SOCIAL HISTORY:   Social History     Socioeconomic History   ??? Marital status: Single     Spouse name: Not on file   ??? Number of children: 2   ??? Years of education: Not on file   ??? Highest education level: Associate degree: occupational, Scientist, product/process development, or vocational program   Occupational History   ??? Not on file   Social Needs   ??? Financial resource strain: Hard   ??? Food insecurity     Worry: Never true     Inability: Never true   ??? Transportation needs     Medical: Yes     Non-medical: Yes   Tobacco Use   ??? Smoking status: Never Smoker   ??? Smokeless tobacco: Never Used   Substance and Sexual  Activity   ??? Alcohol use: No   ??? Drug use: No   ??? Sexual activity: Not on file   Lifestyle   ??? Physical activity     Days per week: 0 days     Minutes per session: 0 min   ??? Stress: Only a little   Relationships   ??? Social Wellsite geologist on phone: More than three times a week     Gets together: Once a week     Attends religious service: 1 to 4 times per year     Active member of club or organization: No     Attends meetings of clubs or organizations: Never     Relationship status: Never married   Other Topics Concern   ??? Do you use sunscreen? No   ??? Tanning bed use? No   ??? Are you easily burned? No   ??? Excessive sun exposure? No   ??? Blistering sunburns? No   Social History Narrative   ??? Not on file       REVIEW OF SYSTEMS:   10-system review of systems negative other than what is mentioned above.  The patient was asked to review all abnormal responses not pertinent to today's visit with their primary care physician.    PHYSICAL EXAM:   GENERAL: Pleasant male in no acute distress.   VITAL SIGNS: There were no vitals taken for this visit.  HEENT: Normocephalic, atraumatic, extraocular muscles intact  NECK: Supple, no lymphadenopathy  CARDIOVASCULAR: No peripheral edema  PULMONARY: Normal work of breathing, no use of accessory muscles  ABDOMEN: Soft, non-tender, non-distended. No organomegaly or hernias.  BACK: No costovertebral angle tenderness, no spiny bone tenderness.   EXTREMITIES: No clubbing, cyanosis or edema.   NEUROLOGIC:  Cranial nerves II-XII grossly intact  PSYCHOLOGIC: Normal affect, normal mood  SKIN: Warm and dry. No lesions.  GU: ***  DRE: ***    LAB RESULTS:   Results for orders placed or performed in visit on 05/20/19   Quantiferon TB Gold Plus   Result Value Ref Range    QuantiFERON Incubation Incubation performed.     QuantiFERON Criteria Comment     QuantiFERON TB1 Ag Value 0.03 IU/mL    QuantiFERON TB2 Ag Value 0.03 IU/mL    QuantiFERON Nil Value 0.03 IU/mL    QuantiFERON Mitogen Value >10.00 IU/mL QuantiFERON TB Gold Negative Negative     Ordered at this visit: No orders of the defined types were placed in this encounter.      No results found for: PSASCRN, PSADIAG    Lab Results   Component Value Date    WBC 10.5 04/30/2019    HGB 12.3 (L) 04/30/2019    HCT 39.0 04/30/2019    PLT 374 04/30/2019       Lab Results   Component Value Date    NA 138 01/09/2018    K 3.4 (L) 01/09/2018    CL 107 01/09/2018    CO2 27.0 01/09/2018    BUN 14 04/30/2019    CREATININE 0.98 04/30/2019    GLU 90 01/09/2018    CALCIUM 8.9 01/09/2018    MG 2.1 07/12/2016       Lab Results   Component Value Date    BILITOT 0.3 01/09/2018    PROT 7.6 01/09/2018    ALBUMIN 3.8 01/09/2018    ALT 54 (H) 04/30/2019    AST 27 04/30/2019    ALKPHOS 95 01/09/2018  No results found for: LABPROT, INR, APTT

## 2019-06-29 MED ORDER — EMPTY CONTAINER
2 refills | 0 days
Start: 2019-06-29 — End: ?

## 2019-06-30 MED FILL — HUMIRA PEN CITRATE FREE 40 MG/0.4 ML: 28 days supply | Qty: 6 | Fill #0 | Status: AC

## 2019-06-30 MED FILL — EMPTY CONTAINER: 120 days supply | Qty: 1 | Fill #0

## 2019-06-30 MED FILL — HUMIRA PEN CITRATE FREE 40 MG/0.4 ML: 28 days supply | Qty: 6 | Fill #0

## 2019-06-30 MED FILL — EMPTY CONTAINER: 120 days supply | Qty: 1 | Fill #0 | Status: AC

## 2019-07-01 NOTE — Unmapped (Signed)
No show 12/7

## 2019-08-05 NOTE — Unmapped (Signed)
Called patient to schedule follow up. Left voice message to schedule appt.

## 2019-08-06 DIAGNOSIS — S46912A Strain of unspecified muscle, fascia and tendon at shoulder and upper arm level, left arm, initial encounter: Principal | ICD-10-CM

## 2019-08-06 DIAGNOSIS — M12812 Other specific arthropathies, not elsewhere classified, left shoulder: Principal | ICD-10-CM

## 2019-08-06 NOTE — Unmapped (Signed)
The [facility] has made a third and final attempt to reach this patient to refill the following medication: Humira.      We have left voicemails on the following phone numbers: [phone] and [phone]. Unfortunately, he does not have MyChart set up. I did send an email to the address on file.     Dates contacted: 12/29 (main # only), 1/4 (both #s), and 1/14 (both #s)  Last scheduled delivery: 06/30/19 for 1 month supply    The patient may be at risk of non-compliance with this medication. The patient should call the [facility] at [phone] (option 4) to refill medication.    Lanney Gins   [facility] Specialty Pharmacist

## 2019-08-07 ENCOUNTER — Ambulatory Visit
Admit: 2019-08-07 | Discharge: 2019-08-07 | Disposition: A | Discharge status: Home / Self Care | Payer: MEDICARE | Attending: Family Medicine

## 2019-08-07 ENCOUNTER — Emergency Department
Admit: 2019-08-07 | Discharge: 2019-08-07 | Disposition: A | Discharge status: Home / Self Care | Payer: MEDICARE | Attending: Family Medicine

## 2019-08-07 NOTE — Unmapped (Signed)
Patient complains of left shoulder pain, states he tore his rotator cuff 3 years ago and never had surgery, states arm has been going numb and hard to grip with left hand, no recent injury that he's aware of.

## 2019-08-07 NOTE — Unmapped (Signed)
Emergency Department Provider Note        ED Clinical Impression     Final diagnoses:   Left shoulder strain, initial encounter (Primary)   Rotator cuff arthropathy of left shoulder       ED Assessment/Plan     Patient is a 39 y.o. male with a PMH of anxiety, depression, CKD, GERD, and HS presenting with a throbbing left shoulder pain with intermittent arm numbness. VS WNL. Differential includes, but not limited to: [facility] joint separation, rotator cuff injury, nerve impingement, shoulder strain, fx, dislocation. Plan for left shoulder x-ray. Will give Toradol for pain control.     ED Course as of Aug 07 1231   Thu Aug 06, 2019   2129 IMPRESSION:  Negative.   XR Shoulder 3 Or More Views Left      Results reviewed with patient. Suspect rotator cuff injury in setting of weightlifting. Recommended to avoid heavy lifting and to use NSAIDs. No indication for sling at this time. Symptomatic care reviewed. PCP f/u one week. Return precautions reviewed. Patient expresses understanding and is comfortable with discharge plan.     History     Chief Complaint   Patient presents with   ??? Shoulder Pain     Ranae Palms is a 39 y.o. right hand dominant male with a PMH of anxiety, depression, CKD, GERD, and HS presenting with left shoulder pain. Patient reports 3 weeks a throbbing left shoulder pain worsened with movement. He also notes associated intermittent numbness to his LUE at times. No cyanosis noted to LUE. Also states that his hand hurts when he squeezes it. Denies any new trauma or injury to the area, but he has been weightlifting recently, max 100lbs. Denies taking any medication for the pain. Endorses history of left shoulder dislocation and states that he had similar pain. No known COVID contacts.     Past Medical History:   Diagnosis Date   ??? Anemia    ??? Anxiety    ??? Depression    ??? Financial difficulties    ??? Gangrene (CMS-HCC)    ??? Hidradenitis suppurativa    ??? Illiteracy and low-level literacy ??? Impaired mobility    ??? Lack of access to transportation        Past Surgical History:   Procedure Laterality Date   ??? AXILLARY HIDRADENITIS EXCISION     ??? gangrene      scrotum   ??? PR EXC SKIN BENIG <0.5 CM REMAINDER BODY N/A 01/25/2016    Procedure: EXCISION HYDRADENITIS RIGHT  AXILLA, BILATERAL GROIN, LEFT BUTTOCKS;  Surgeon: Gypsy Lore, MD;  Location: OR [place];  Service: General Surgery   ??? UMBILICAL HERNIA REPAIR         Family History   Problem Relation Age of Onset   ??? Arthritis Mother    ??? ALS Mother    ??? No Known Problems Father    ??? Migraines Sister    ??? Anemia Sister    ??? No Known Problems Maternal Grandmother    ??? No Known Problems Maternal Grandfather    ??? Diabetes Paternal Grandmother    ??? Melanoma Neg Hx    ??? Basal cell carcinoma Neg Hx    ??? Squamous cell carcinoma Neg Hx        Social History     Socioeconomic History   ??? Marital status: Single     Spouse name: None   ??? Number of children: 2   ??? Years of  education: None   ??? Highest education level: Associate degree: occupational, Scientist, product/process development, or vocational program   Occupational History   ??? None   Social Needs   ??? Financial resource strain: Hard   ??? Food insecurity     Worry: Never true     Inability: Never true   ??? Transportation needs     Medical: Yes     Non-medical: Yes   Tobacco Use   ??? Smoking status: Never Smoker   ??? Smokeless tobacco: Never Used   Substance and Sexual Activity   ??? Alcohol use: No   ??? Drug use: No   ??? Sexual activity: None   Lifestyle   ??? Physical activity     Days per week: 0 days     Minutes per session: 0 min   ??? Stress: Only a little   Relationships   ??? Social Wellsite geologist on phone: More than three times a week     Gets together: Once a week     Attends religious service: 1 to 4 times per year     Active member of club or organization: No     Attends meetings of clubs or organizations: Never     Relationship status: Never married   Other Topics Concern   ??? Do you use sunscreen? No   ??? Tanning bed use? No ??? Are you easily burned? No   ??? Excessive sun exposure? No   ??? Blistering sunburns? No   Social History Narrative   ??? None       Review of Systems   Constitutional: Negative for chills and fever.   HENT: Negative for rhinorrhea and sore throat.    Respiratory: Negative for cough and shortness of breath.    Cardiovascular: Negative for chest pain.   Gastrointestinal: Negative for abdominal pain, diarrhea, nausea and vomiting.   Genitourinary: Negative.    Musculoskeletal: Positive for arthralgias. Negative for myalgias.   Skin: Negative for rash.   Neurological: Positive for numbness (intermittent). Negative for weakness and headaches.   All other systems reviewed and are negative.      Physical Exam     BP 143/91  - Pulse 71  - Temp 36.5 ??C (97.7 ??F) (Temporal)  - Resp 20  - Ht 182.9 cm (6')  - Wt (!) 117.9 kg (260 lb)  - SpO2 99%  - BMI 35.26 kg/m??     Physical Exam  Vitals signs and nursing note reviewed.   Constitutional:       General: He is not in acute distress.     Appearance: He is well-developed. He is obese. He is not ill-appearing or toxic-appearing.   HENT:      Head: Normocephalic and atraumatic.      Comments: Masked for COVID precautions.  Eyes:      General: No scleral icterus.     Conjunctiva/sclera: Conjunctivae normal.   Neck:      Musculoskeletal: Normal range of motion.      Comments: No midline cervical spinal tenderness  Cardiovascular:      Rate and Rhythm: Normal rate and regular rhythm.      Pulses: Normal pulses.      Heart sounds: Normal heart sounds. No murmur.   Pulmonary:      Effort: Pulmonary effort is normal. No respiratory distress.      Breath sounds: Normal breath sounds. No wheezing or rales.   Musculoskeletal: Normal range of motion. Comments: Tenderness over  left AC joint and posterior head of humerus. No edema, skin changes or erythema, FROM of shoulder. Mild discomfort recreated with forward flexion against resistance. No sulcus sign. Negative empty can and speeds test.    Skin:     General: Skin is warm and dry.      Findings: No rash.   Neurological:      Mental Status: He is alert and oriented to person, place, and time.      Coordination: Coordination normal.      Comments: Sensation intact to radial, medial and ulnar distributions. Grip strength 4/5 on left.    Psychiatric:         Mood and Affect: Mood normal.         Behavior: Behavior normal.         ED Course           MDM  Reviewed: previous chart, nursing note and vitals  Interpretation: x-ray         Documentation assistance was provided by Sherle Poe, Scribe, on August 06, 2019 at 8:14 PM for Willeen Cass, PA-C.     August 07, 2019 12:40 PM. Documentation assistance provided by the scribe. I was present during the time the encounter was recorded. The information recorded by the scribe was done at my direction and has been reviewed and validated by me.          Doree Albee [place], [place]  08/07/19 1301

## 2019-10-22 NOTE — Unmapped (Addendum)
Patient Education        Hidradenitis Suppurativa: Care Instructions  Your Care Instructions     Hidradenitis suppurativa (say hih-drad-uh-NY-tus sup-yur-uh-TY-vuh) is a skin condition that causes lumps on the skin that look like pimples or boils. The lumps are usually painful and can break open and drain blood and bad-smelling pus. The condition can come and go for many years.  Treatment for this condition may include antibiotics and other medicines. You may need surgery to remove the lumps. Home care includes wearing loose-fitting clothes and washing the area gently. You can help prevent lumps from coming back by staying at a healthy weight and not smoking.  Doctors don't know exactly how this condition starts. But they do know that something irritates and inflames the hair follicles, causing them to swell and form lumps. This skin condition can't be spread from person to person (isn't contagious).  Follow-up care is a key part of your treatment and safety. Be sure to make and go to all appointments, and call your doctor if you are having problems. It's also a good idea to know your test results and keep a list of the medicines you take.  How can you care for yourself at home?  Skin care  ?? ?? Wash the area every day with mild soap. Use your hands rather than a washcloth or sponge when you wash that part of your body.   ?? ?? Leave the affected areas uncovered when you can. If you have lumps that are draining, you can cover them with a bandage or other dressing. Put petroleum jelly (such as Vaseline) on the dressing to help keep it from sticking.   ?? ?? Wear-loose fitting clothes that don't rub against the area. Avoid activities that cause skin to rub together.   ?? ?? If you have pain, try a warm compress. Soak a towel or washcloth in warm water, wring it out, and place it on the affected skin for about 10 minutes.   Medicines  ?? ?? Be safe with medicines. Take your medicines exactly as prescribed. Call your doctor if you think you are having a problem with your medicine. You will get more details on the specific medicines your doctor prescribes.   ?? ?? If your doctor prescribed antibiotics, take them as directed. Do not stop taking them just because you feel better. You need to take the full course of antibiotics.   Lifestyle choices  ?? ?? If you smoke, think about quitting. Smoking can make the condition worse. If you need help quitting, talk to your doctor about stop-smoking programs and medicines. These can increase your chances of quitting for good.   ?? ?? Stay at a healthy weight, or lose weight, by eating healthy foods and being physically active. Being overweight could make this condition worse.   When should you call for help?   Call your doctor now or seek immediate medical care if:  ?? ?? You have symptoms of infection, such as:  ? Increased pain, swelling, warmth, or redness.  ? Red streaks leading from the area.  ? Pus draining from the area.  ? A fever.   Watch closely for changes in your health, and be sure to contact your doctor if:  ?? ?? You do not get better as expected.   Where can you learn more?  Go to [facility] at https://myuncchart.org  Select Patient Education under [employer]. Enter [phone] in the search box to learn more about Hidradenitis  Suppurativa: Care Instructions.  Current as of: January 22, 2019??????????????????????????????Content Version: 12.8  ?? 2006-2021 [employer].   Care instructions adapted under license by [facility]. If you have questions about a medical condition or this instruction, always ask your healthcare professional. [employer], Inc[employer] disclaims any warranty or liability for your use of this information.

## 2019-10-22 NOTE — Unmapped (Deleted)
error 

## 2019-11-04 NOTE — Unmapped (Signed)
This encounter was created in error - please disregard.

## 2019-11-17 NOTE — Unmapped (Deleted)
Disregard encounter created in error.

## 2019-11-19 NOTE — Unmapped (Unsigned)
Dermatology Note     Assessment and Plan:      Hidradenitis Suppurative, Hurley Stage III  - Discussed that this is a chronic and progressive disease, discussed management options with patient today  - Although areas appear clinically c/w infectious etiology- abscesses, inflamed nodules are secondary to systemic inflammation and not infectious etiology. This has been seen when sterile cultures return from I&D to nodules/abscesses. I&D and/or debridement are not best management for this procedure in long term as disease continues to progress given underlying sinus tracts  - We treat with short term abx (limited efficacy as disease progresses), immunotherapy, anti androgen therapy, sulfonylurea (metformin) and in office deroofing procedures  - A mutual decision was made to proceed as follows:  - Start Humira 160 mg on day 1 then inject 80 mg on day 15 then inject (40 mg) weekly beginning on day 29, sent to [facility]  - Provided temporary and short term refill of pain medications (oxycodone and oxycodone ER) as patient is pending evaluation with new pain doctor at end of the month. This is a very debilitating and disabling disease and I do recommend establishing care with palliative care given chronicity of flares and severity of his disease   ??  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  - Managed by urology  ??  High Risk Medication Use:  - Reviewed labs drawn on 04/30/2019 (CBC with diff, Hepatitis Serologies, ALT/AST, ESR) and 05/24/2019 (Quant Gold) which were notable for elevated Sed Rate (but stable compared to prior), very mildly elevated ALT (54), and slightly low hemoglobin of 12.3 (improved from prior of 10.8).     There are no diagnoses linked to this encounter.    The patient was advised to call for an appointment should any new, changing, or symptomatic lesions develop.     RTC: No follow-ups on file. or sooner as needed   _________________________________________________________________      Chief Complaint     No chief complaint on file.      HPI     Edward Berry. Edward Berry is a 39 y.o. male who presents as a returning patient (last seen by Dr. Izora Ribas on 04/30/2019) to [facility] for follow up of HS. At last visit, mutual decision was made to start Humira for the same. ***      History carried forward for reference:  - HS since teenage years (> 20 years ago) affecting the bilateral axillae, inguinal folds, scrotum, perineum, and gluteal skin b/l.  - Prior treatments attempted include: numerous I&Ds of all areas, and several rounds of PO and IV anti-biotics requiring ER and hospital admission for management.    - History of scrotal edema for which he was seen by [facility]  - This interferes with ambulation  - Denies voiding or sexual difficulties due to edema.  - Denies history of DMII, family history of HS    The patient denies any other new or changing lesions or areas of concern.     Pertinent Past Medical History     Anemia   HS  Scrotal Edema    Family History:   Negative for HS  Mother passed away from ALS, sister with history of anemia.    Past Medical History, Family History, Social History, Medication List, Allergies, and Problem List were  reviewed in the rooming section of Epic.     ROS: Other than symptoms mentioned in the HPI, no fevers, chills, or other skin complaints    Physical Examination     GENERAL: Well-appearing male in no acute distress, resting comfortably.  NEURO: Alert and oriented, answers questions appropriately  PSYCH: Normal mood and affect  {PE extent:75514} was performed  {PE list:75421}  ***    All areas not commented on are within normal limits or unremarkable    Scribe's Attestation: Lorayne Bender, MD obtained and performed the history, physical exam and medical decision making elements that were entered into the chart.  Signed by Leta Speller, Scribe, on  ***    {*** NOTE TO PROVIDER: PLEASE ADD ATTESTATION NOTING YOU AGREE WITH SCRIBE DOCUMENTATION}       (Approved Template 11/16/2019)

## 2019-12-16 NOTE — Unmapped (Incomplete)
ASSESSMENT/PLAN:  Hidradenitis Suppurative, Hurley Stage III  - Discussed that this is a chronic and progressive disease, discussed management options with patient today  - Although areas appear clinically c/w infectious etiology- abscesses, inflamed nodules are secondary to systemic inflammation and not infectious etiology. This has been seen when sterile cultures return from I&D to nodules/abscesses. I&D and/or debridement are not best management for this procedure in long term as disease continues to progress given underlying sinus tracts  - We treat with short term abx (limited efficacy as disease progresses), immunotherapy, anti androgen therapy, sulfonylurea (metformin) and in office deroofing procedures  - A mutual decision was made to proceed as follows:  - Start Humira 160 mg on day 1 then inject 80 mg on day 15 then inject (40 mg) weekly beginning on day 29, sent to [facility]  - Provided temporary and short term refill of pain medications (oxycodone and oxycodone ER) as patient is pending evaluation with new pain doctor at end of the month. 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     - Neg Quant TB Gold Plus on 06/11/2019     RTC: ***     CC: Hidradenitis Suppurativa    SUBJECTIVE:    This is a pleasant 39 y.o. male who is a returning patient to [facility] for follow up of hidradenitis suppurativa.He was last seen by Dr. Izora Ribas on 04/30/2019 for the same at which time he was started on Humira. Today, he reports ***.     Prior history carried for reference:  - HS since teenage years (>20 years ago)   - Has affected his bilateral axillae, inguinal folds, scrotum, perineum and gluteal skin b/l.   - Has udnergone numerous I&D procedures of all areas, several rounds of PO and IV abx requiring ER and hospital admission for management.   - Per chart review, patient was referred to [facility] Dr. Janyth Contes several times but has not been able to follow through with appointments.   - No family history of HS     Family History:   Mother passed away from ALS, sister with history of anemia.   No family hx of HS.    Social History:  - Non smoker      ROS: the balance of 10 systems is negative unless otherwise documented    OBJECTIVE:   Gen: Well-appearing patient, appropriate, interactive, in no acute distress  Skin: Examination of the scalp, face, neck, chest, back, abdomen, bilateral upper and lower extremities, hands, palms, soles, nails, buttocks, and external genitalia performed today and pertinent for:     location Abscess Inflamed nodule Non-inflamed nodule Draining sinus Non-draining Sinus Hurley BSA at site Color change  Inflamed induration Open skin surface  Tunnels   R axilla              L axilla              R inframammary  L inframammary              Intermammary              Pubic              R inguinal              R thigh              L inguinal              L thigh              Scrotum/Vulva              Perianal              R buttock              L buttock              Other (list)                            *0=none, 1=mild, 2=moderate, 3=severe    AN count (total sum of abscess and inflammatory nodule): ***  Pilonidal sinus (Y/N, or previously treated)? {pilonidal presence:55619}  Approximate BSA involved by inflammatory lesions: ***  Intertriginous comedones: {Desc; few - many:55616}  Diffuse comedones (trunk, face, etc): {Desc; few - many:55616}  Acne scars: {Desc; few - ZOXW:96045}  Cribriform scarring: {YES/NO:21013}  Intertrigionus epidermal inclusion cysts: {Desc; few - many:55618}  Diffuse (trunk, feace, extremities) epidermal inclusion cysts: {Desc; few - WUJW:11914}  Phenotype: {hsphenotype:64293}  -sites not commented on demonstrate normal findings.    Scribe's Attestation: Lorayne Bender, MD obtained and performed the history, physical exam and medical decision making elements that were entered into the chart. Signed by Hazle Nordmann, Scribe, on ***.     {*** NOTE TO PROVIDER: PLEASE ADD ATTESTATION NOTING YOU AGREE WITH SCRIBE DOCUMENTATION}       (Approved Template 12/11/2019)

## 2019-12-17 ENCOUNTER — Ambulatory Visit: Admit: 2019-12-17 | Payer: MEDICARE | Attending: Dermatology | Primary: Dermatology

## 2019-12-24 NOTE — Unmapped (Unsigned)
This patient has been disenrolled from the [facility] specialty pharmacy services due to unable to reach, and has no follow up with clinic. Last appointments have been cancelled/no show. Will see if we can d/c Humira order for now, so if it's renewed, we'll know to reach back out to patient. Tawanna Solo [facility] Specialty Pharmacist

## 2020-03-01 MED ORDER — OXYCODONE 15 MG TABLET
Freq: Three times a day (TID) | ORAL | 0.00000 days | Status: SS | PRN
Start: 2020-03-01 — End: ?

## 2020-03-19 ENCOUNTER — Ambulatory Visit: Admit: 2020-03-19 | Discharge: 2020-03-20 | Payer: MEDICARE

## 2020-03-20 MED ORDER — KETOROLAC 10 MG TABLET
ORAL_TABLET | Freq: Four times a day (QID) | ORAL | 0 refills | 5.00000 days | Status: CP | PRN
Start: 2020-03-20 — End: 2020-03-25

## 2020-03-20 MED ORDER — OXYCODONE ER 20 MG TABLET,CRUSH RESISTANT,EXTENDED RELEASE 12 HR
ORAL_TABLET | Freq: Two times a day (BID) | ORAL | 0 refills | 5 days
Start: 2020-03-20 — End: 2020-03-25

## 2020-03-20 MED ORDER — CLINDAMYCIN HCL 300 MG CAPSULE
ORAL_CAPSULE | Freq: Three times a day (TID) | ORAL | 0 refills | 14 days | Status: CP
Start: 2020-03-20 — End: 2020-04-03
  Filled 2020-03-20: qty 42, 14d supply, fill #0

## 2020-03-20 MED ORDER — ACETAMINOPHEN 500 MG TABLET
ORAL_TABLET | Freq: Three times a day (TID) | ORAL | 0 refills | 7 days
Start: 2020-03-20 — End: 2020-03-27

## 2020-03-20 MED ORDER — DOXYCYCLINE HYCLATE 100 MG CAPSULE
ORAL_CAPSULE | Freq: Two times a day (BID) | ORAL | 0 refills | 14.00000 days | Status: CP
Start: 2020-03-20 — End: 2020-04-03

## 2020-03-20 MED ORDER — NAPROXEN 500 MG TABLET
ORAL_TABLET | Freq: Two times a day (BID) | ORAL | 0 refills | 5.00000 days
Start: 2020-03-20 — End: 2020-03-25

## 2020-03-20 MED ORDER — HYDROMORPHONE 2 MG TABLET
ORAL_TABLET | Freq: Four times a day (QID) | ORAL | 0 refills | 2.00000 days | Status: CP | PRN
Start: 2020-03-20 — End: 2020-03-20
  Filled 2020-03-20: qty 10, 2d supply, fill #0

## 2020-03-20 MED ORDER — HYDROMORPHONE 2 MG TABLET: 4 mg | tablet | Freq: Four times a day (QID) | 0 refills | 2 days | Status: AC

## 2020-03-20 MED FILL — CLINDAMYCIN HCL 300 MG CAPSULE: 14 days supply | Qty: 42 | Fill #0 | Status: AC

## 2020-03-20 MED FILL — NAPROXEN 500 MG TABLET: ORAL | 5 days supply | Qty: 10 | Fill #0

## 2020-03-20 MED FILL — NAPROXEN 500 MG TABLET: 5 days supply | Qty: 10 | Fill #0 | Status: AC

## 2020-03-20 MED FILL — HYDROMORPHONE 2 MG TABLET: 2 days supply | Qty: 10 | Fill #0 | Status: AC

## 2020-04-06 ENCOUNTER — Ambulatory Visit: Admit: 2020-04-06 | Discharge: 2020-04-06 | Payer: MEDICARE

## 2020-04-18 ENCOUNTER — Ambulatory Visit: Admit: 2020-04-18 | Payer: MEDICARE | Attending: Urology | Primary: Urology

## 2020-08-20 DIAGNOSIS — N179 Acute kidney failure, unspecified: Principal | ICD-10-CM

## 2020-08-20 DIAGNOSIS — F419 Anxiety disorder, unspecified: Principal | ICD-10-CM

## 2020-08-20 DIAGNOSIS — L732 Hidradenitis suppurativa: Principal | ICD-10-CM

## 2020-08-20 DIAGNOSIS — Z20822 Contact with and (suspected) exposure to covid-19: Principal | ICD-10-CM

## 2020-08-20 DIAGNOSIS — N5082 Scrotal pain: Principal | ICD-10-CM

## 2020-08-20 DIAGNOSIS — N5089 Other specified disorders of the male genital organs: Principal | ICD-10-CM

## 2020-08-20 DIAGNOSIS — I890 Lymphedema, not elsewhere classified: Principal | ICD-10-CM

## 2020-08-20 DIAGNOSIS — N492 Inflammatory disorders of scrotum: Principal | ICD-10-CM

## 2020-08-20 DIAGNOSIS — Z882 Allergy status to sulfonamides status: Principal | ICD-10-CM

## 2020-08-21 ENCOUNTER — Ambulatory Visit: Admit: 2020-08-21 | Discharge: 2020-08-23 | Disposition: A | Discharge status: Home / Self Care | Payer: MEDICARE

## 2020-08-23 MED ORDER — CIPROFLOXACIN 500 MG TABLET
ORAL_TABLET | Freq: Two times a day (BID) | ORAL | 0 refills | 7.00000 days | Status: SS
Start: 2020-08-23 — End: 2021-09-13

## 2020-08-23 MED ORDER — HYDROXYZINE HCL 25 MG TABLET
ORAL_TABLET | Freq: Three times a day (TID) | ORAL | 0 refills | 7.00000 days | Status: SS | PRN
Start: 2020-08-23 — End: 2021-09-13

## 2020-08-23 MED ORDER — METRONIDAZOLE 500 MG TABLET
ORAL_TABLET | Freq: Three times a day (TID) | ORAL | 0 refills | 7.00000 days | Status: CP
Start: 2020-08-23 — End: 2020-08-30

## 2020-08-24 MED ORDER — DOCUSATE SODIUM 100 MG CAPSULE
ORAL_CAPSULE | Freq: Every day | ORAL | 0 refills | 30.00000 days | Status: CP
Start: 2020-08-24 — End: 2020-09-23

## 2020-09-05 MED ORDER — OXYCODONE 5 MG TABLET
Freq: Three times a day (TID) | ORAL | 0 days | Status: SS | PRN
Start: 2020-09-05 — End: ?

## 2020-09-13 ENCOUNTER — Ambulatory Visit
Admit: 2020-09-13 | Discharge: 2020-09-16 | Disposition: A | Discharge status: Home / Self Care | Payer: MEDICARE | Admitting: Adult Health

## 2020-09-16 MED ORDER — OXYCODONE 10 MG TABLET
ORAL_TABLET | 0 refills | 0 days
Start: 2020-09-16 — End: ?

## 2020-09-16 MED ORDER — POLYETHYLENE GLYCOL 3350 17 GRAM ORAL POWDER PACKET
Freq: Every day | ORAL | 0 refills | 0 days | PRN
Start: 2020-09-16 — End: 2020-10-16

## 2020-09-16 MED ORDER — AMOXICILLIN 875 MG-POTASSIUM CLAVULANATE 125 MG TABLET
ORAL_TABLET | Freq: Two times a day (BID) | ORAL | 0 refills | 7 days | Status: CP
Start: 2020-09-16 — End: 2020-09-23
  Filled 2020-09-16: qty 14, 7d supply, fill #0

## 2020-09-16 MED FILL — OXYCODONE 10 MG TABLET: 7 days supply | Qty: 28 | Fill #0

## 2020-10-15 ENCOUNTER — Ambulatory Visit: Admit: 2020-10-15 | Discharge: 2020-10-19 | Disposition: A | Discharge status: Home / Self Care | Payer: MEDICARE

## 2020-10-19 DIAGNOSIS — S3130XA Unspecified open wound of scrotum and testes, initial encounter: Principal | ICD-10-CM

## 2020-10-19 MED ORDER — CLINDAMYCIN HCL 300 MG CAPSULE
ORAL_CAPSULE | Freq: Three times a day (TID) | ORAL | 0 refills | 21 days | Status: CP
Start: 2020-10-19 — End: 2020-11-09

## 2020-10-19 MED ORDER — RIFAMPIN 300 MG CAPSULE
ORAL_CAPSULE | Freq: Two times a day (BID) | ORAL | 0 refills | 21 days | Status: CP
Start: 2020-10-19 — End: 2020-11-09

## 2020-10-19 MED ORDER — ACETAMINOPHEN 325 MG TABLET
ORAL | 0 refills | 0 days | PRN
Start: 2020-10-19 — End: ?

## 2020-10-25 ENCOUNTER — Ambulatory Visit: Admit: 2020-10-25 | Payer: MEDICARE

## 2020-11-09 ENCOUNTER — Ambulatory Visit: Admit: 2020-11-09 | Discharge: 2020-11-12 | Disposition: A | Discharge status: Home / Self Care | Payer: MEDICARE

## 2020-11-12 MED ORDER — OXYCODONE ER 20 MG TABLET,CRUSH RESISTANT,EXTENDED RELEASE 12 HR
ORAL_TABLET | Freq: Two times a day (BID) | ORAL | 0 refills | 14.00000 days | Status: CP
Start: 2020-11-12 — End: 2020-11-26

## 2020-11-12 MED ORDER — MELATONIN 3 MG TABLET
ORAL_TABLET | Freq: Every evening | ORAL | 0 refills | 60 days | Status: CP | PRN
Start: 2020-11-12 — End: 2021-01-11
  Filled 2020-11-12: qty 60, 60d supply, fill #0

## 2020-11-12 MED ORDER — LINEZOLID 600 MG TABLET
ORAL_TABLET | Freq: Two times a day (BID) | ORAL | 0 refills | 7 days | Status: CP
Start: 2020-11-12 — End: 2020-11-19
  Filled 2020-11-12: qty 14, 7d supply, fill #0

## 2020-11-12 MED ORDER — OXYCODONE 10 MG TABLET
ORAL_TABLET | 0 refills | 5 days | Status: CP
Start: 2020-11-12 — End: 2020-11-17
  Filled 2020-11-12: qty 28, 14d supply, fill #0
  Filled 2020-11-12: qty 28, 5d supply, fill #0

## 2020-11-13 MED ORDER — POLYETHYLENE GLYCOL 3350 17 GRAM/DOSE ORAL POWDER
Freq: Every day | ORAL | 0 refills | 28 days | Status: CP
Start: 2020-11-13 — End: 2020-12-13
  Filled 2020-11-12: qty 238, 14d supply, fill #0

## 2020-12-06 ENCOUNTER — Ambulatory Visit
Admit: 2020-12-06 | Discharge: 2020-12-11 | Disposition: A | Discharge status: Home / Self Care | Payer: MEDICARE | Admitting: Internal Medicine

## 2020-12-11 MED ORDER — CLINDAMYCIN HCL 300 MG CAPSULE
ORAL_CAPSULE | Freq: Two times a day (BID) | ORAL | 0 refills | 14.00000 days | Status: CP
Start: 2020-12-11 — End: 2020-12-25

## 2020-12-11 MED ORDER — RIFAMPIN 300 MG CAPSULE
ORAL_CAPSULE | Freq: Two times a day (BID) | ORAL | 0 refills | 14.00000 days | Status: CP
Start: 2020-12-11 — End: 2020-12-11

## 2020-12-11 MED ORDER — OXYCODONE-ACETAMINOPHEN 7.5 MG-325 MG TABLET
ORAL_TABLET | ORAL | 0 refills | 4.00000 days | Status: CP | PRN
Start: 2020-12-11 — End: 2020-12-11

## 2020-12-12 DIAGNOSIS — N492 Inflammatory disorders of scrotum: Principal | ICD-10-CM

## 2020-12-15 ENCOUNTER — Ambulatory Visit: Admit: 2020-12-15 | Payer: MEDICARE

## 2020-12-28 ENCOUNTER — Ambulatory Visit: Admit: 2020-12-28 | Payer: MEDICARE

## 2021-01-03 ENCOUNTER — Ambulatory Visit: Admit: 2021-01-03 | Payer: MEDICARE

## 2021-02-13 ENCOUNTER — Ambulatory Visit: Admit: 2021-02-13 | Discharge: 2021-02-13 | Disposition: A | Discharge status: Home / Self Care | Payer: MEDICARE

## 2021-02-13 DIAGNOSIS — L0231 Cutaneous abscess of buttock: Principal | ICD-10-CM

## 2021-02-13 DIAGNOSIS — L02214 Cutaneous abscess of groin: Principal | ICD-10-CM

## 2021-02-13 DIAGNOSIS — L732 Hidradenitis suppurativa: Principal | ICD-10-CM

## 2021-02-13 MED ORDER — HYDROCODONE 5 MG-ACETAMINOPHEN 325 MG TABLET
ORAL_TABLET | Freq: Four times a day (QID) | ORAL | 0 refills | 3 days | Status: CP | PRN
Start: 2021-02-13 — End: 2021-02-18

## 2021-02-13 MED ORDER — AMOXICILLIN 875 MG-POTASSIUM CLAVULANATE 125 MG TABLET
ORAL_TABLET | Freq: Two times a day (BID) | ORAL | 0 refills | 10 days | Status: CP
Start: 2021-02-13 — End: 2021-02-23

## 2021-02-13 MED ORDER — DOXYCYCLINE HYCLATE 100 MG CAPSULE
ORAL_CAPSULE | Freq: Two times a day (BID) | ORAL | 0 refills | 10 days | Status: CP
Start: 2021-02-13 — End: 2021-02-23

## 2021-05-30 ENCOUNTER — Ambulatory Visit: Admit: 2021-05-30 | Payer: MEDICARE

## 2021-07-15 ENCOUNTER — Encounter: Admit: 2021-07-15 | Discharge: 2021-07-20 | Payer: MEDICARE | Attending: Pediatric Anesthesiology

## 2021-07-15 ENCOUNTER — Ambulatory Visit: Admit: 2021-07-15 | Discharge: 2021-07-20 | Payer: MEDICARE

## 2021-07-15 ENCOUNTER — Ambulatory Visit: Admit: 2021-07-15 | Discharge: 2021-07-20 | Disposition: A | Discharge status: Home Health | Payer: MEDICARE

## 2021-07-20 DIAGNOSIS — N492 Inflammatory disorders of scrotum: Principal | ICD-10-CM

## 2021-07-20 MED ORDER — CLINDAMYCIN HCL 300 MG CAPSULE
ORAL_CAPSULE | Freq: Two times a day (BID) | ORAL | 0 refills | 30 days | Status: CP
Start: 2021-07-20 — End: 2021-08-19

## 2021-07-20 MED ORDER — OXYCODONE 15 MG TABLET
ORAL_TABLET | ORAL | 0 refills | 5 days | Status: CP | PRN
Start: 2021-07-20 — End: 2021-07-27

## 2021-07-20 MED ORDER — GABAPENTIN 300 MG CAPSULE
ORAL_CAPSULE | Freq: Three times a day (TID) | ORAL | 0 refills | 30.00000 days | Status: CP
Start: 2021-07-20 — End: 2021-08-19

## 2021-07-20 MED ORDER — RIFAMPIN 300 MG CAPSULE
ORAL_CAPSULE | Freq: Two times a day (BID) | ORAL | 0 refills | 30 days | Status: CP
Start: 2021-07-20 — End: 2021-08-19

## 2021-07-22 MED ORDER — FERROUS SULFATE 325 MG (65 MG IRON) TABLET
ORAL_TABLET | ORAL | 11 refills | 30 days | Status: CP
Start: 2021-07-22 — End: 2022-07-22

## 2021-08-03 ENCOUNTER — Ambulatory Visit: Admit: 2021-08-03 | Payer: MEDICARE

## 2021-08-03 DIAGNOSIS — L732 Hidradenitis suppurativa: Principal | ICD-10-CM

## 2021-08-03 DIAGNOSIS — N492 Inflammatory disorders of scrotum: Principal | ICD-10-CM

## 2021-08-03 MED ORDER — RIFAMPIN 300 MG CAPSULE
ORAL_CAPSULE | Freq: Two times a day (BID) | ORAL | 2 refills | 30 days | Status: CP
Start: 2021-08-03 — End: 2021-11-01

## 2021-08-03 MED ORDER — OXYCODONE 15 MG TABLET
ORAL_TABLET | ORAL | 0 refills | 5 days | Status: CP | PRN
Start: 2021-08-03 — End: 2021-08-10

## 2021-08-09 MED ORDER — OXYCODONE 15 MG TABLET
ORAL_TABLET | ORAL | 0 refills | 5.00000 days | Status: CP | PRN
Start: 2021-08-09 — End: 2021-08-16

## 2021-08-16 MED ORDER — GABAPENTIN 300 MG CAPSULE
ORAL_CAPSULE | 0 refills | 0 days
Start: 2021-08-16 — End: ?

## 2021-08-17 ENCOUNTER — Ambulatory Visit: Admit: 2021-08-17 | Discharge: 2021-08-18 | Payer: MEDICARE

## 2021-08-17 DIAGNOSIS — N492 Inflammatory disorders of scrotum: Principal | ICD-10-CM

## 2021-08-17 DIAGNOSIS — L732 Hidradenitis suppurativa: Principal | ICD-10-CM

## 2021-08-17 MED ORDER — OXYCODONE 15 MG TABLET
ORAL_TABLET | ORAL | 0 refills | 5 days | Status: CP | PRN
Start: 2021-08-17 — End: 2021-08-24

## 2021-08-21 DIAGNOSIS — N492 Inflammatory disorders of scrotum: Principal | ICD-10-CM

## 2021-08-21 DIAGNOSIS — L732 Hidradenitis suppurativa: Principal | ICD-10-CM

## 2021-08-24 DIAGNOSIS — L732 Hidradenitis suppurativa: Principal | ICD-10-CM

## 2021-08-24 MED ORDER — CLINDAMYCIN HCL 300 MG CAPSULE
ORAL_CAPSULE | Freq: Two times a day (BID) | ORAL | 0 refills | 30 days | Status: CP
Start: 2021-08-24 — End: 2021-09-23

## 2021-08-24 MED ORDER — OXYCODONE 15 MG TABLET
ORAL_TABLET | ORAL | 0 refills | 5 days | Status: CP | PRN
Start: 2021-08-24 — End: 2021-08-31

## 2021-08-31 MED ORDER — OXYCODONE 15 MG TABLET
ORAL_TABLET | ORAL | 0 refills | 5 days | Status: CP | PRN
Start: 2021-08-31 — End: 2021-09-07

## 2021-09-07 ENCOUNTER — Ambulatory Visit: Admit: 2021-09-07 | Payer: MEDICARE

## 2021-09-07 DIAGNOSIS — L732 Hidradenitis suppurativa: Principal | ICD-10-CM

## 2021-09-07 DIAGNOSIS — N492 Inflammatory disorders of scrotum: Principal | ICD-10-CM

## 2021-09-07 MED ORDER — DAPSONE 100 MG TABLET
ORAL_TABLET | Freq: Every day | ORAL | 2 refills | 30 days | Status: CP
Start: 2021-09-07 — End: 2021-12-06

## 2021-09-07 MED ORDER — OXYCODONE 15 MG TABLET
ORAL_TABLET | ORAL | 0 refills | 5 days | Status: CP | PRN
Start: 2021-09-07 — End: 2021-09-14

## 2021-09-13 ENCOUNTER — Ambulatory Visit: Admit: 2021-09-13 | Discharge: 2021-09-14 | Payer: MEDICARE

## 2021-09-14 MED ORDER — OXYCODONE 15 MG TABLET
ORAL_TABLET | ORAL | 0 refills | 5 days | Status: CP | PRN
Start: 2021-09-14 — End: 2021-09-21

## 2021-09-21 DIAGNOSIS — N492 Inflammatory disorders of scrotum: Principal | ICD-10-CM

## 2021-09-21 DIAGNOSIS — L732 Hidradenitis suppurativa: Principal | ICD-10-CM

## 2021-09-21 MED ORDER — OXYCODONE 15 MG TABLET
ORAL_TABLET | ORAL | 0 refills | 5 days | Status: CP | PRN
Start: 2021-09-21 — End: 2021-09-28

## 2021-09-27 ENCOUNTER — Ambulatory Visit: Admit: 2021-09-27 | Discharge: 2021-09-28 | Payer: MEDICARE

## 2021-10-02 MED ORDER — DAPSONE 100 MG TABLET
ORAL_TABLET | Freq: Every day | ORAL | 2 refills | 30 days | Status: CP
Start: 2021-10-02 — End: 2021-12-31

## 2021-10-02 MED ORDER — OXYCODONE 15 MG TABLET
ORAL_TABLET | ORAL | 0 refills | 5 days | Status: CP | PRN
Start: 2021-10-02 — End: 2021-10-09

## 2021-10-05 ENCOUNTER — Ambulatory Visit: Admit: 2021-10-05 | Payer: MEDICARE

## 2021-10-05 DIAGNOSIS — L732 Hidradenitis suppurativa: Principal | ICD-10-CM

## 2021-10-05 DIAGNOSIS — S31109A Unspecified open wound of abdominal wall, unspecified quadrant without penetration into peritoneal cavity, initial encounter: Principal | ICD-10-CM

## 2021-10-09 MED ORDER — OXYCODONE 15 MG TABLET
ORAL_TABLET | ORAL | 0 refills | 5 days | Status: CP | PRN
Start: 2021-10-09 — End: 2021-10-16

## 2021-10-18 MED ORDER — OXYCODONE 15 MG TABLET
ORAL_TABLET | ORAL | 0 refills | 5 days | Status: CP | PRN
Start: 2021-10-18 — End: 2021-10-25

## 2021-10-23 DIAGNOSIS — N492 Inflammatory disorders of scrotum: Principal | ICD-10-CM

## 2021-10-23 DIAGNOSIS — S31109A Unspecified open wound of abdominal wall, unspecified quadrant without penetration into peritoneal cavity, initial encounter: Principal | ICD-10-CM

## 2021-10-23 DIAGNOSIS — L732 Hidradenitis suppurativa: Principal | ICD-10-CM

## 2021-10-23 DIAGNOSIS — N5082 Scrotal pain: Principal | ICD-10-CM

## 2021-10-23 MED ORDER — DAPSONE 100 MG TABLET
ORAL_TABLET | Freq: Every day | ORAL | 2 refills | 30 days | Status: CP
Start: 2021-10-23 — End: 2022-01-21

## 2021-10-23 MED ORDER — OXYCODONE 15 MG TABLET
ORAL_TABLET | ORAL | 0 refills | 5 days | Status: CP | PRN
Start: 2021-10-23 — End: 2021-10-30

## 2021-10-23 MED ORDER — PREDNISONE 20 MG TABLET
ORAL_TABLET | ORAL | 0 refills | 9 days | Status: CP
Start: 2021-10-23 — End: 2021-11-01

## 2021-10-30 ENCOUNTER — Ambulatory Visit: Admit: 2021-10-30 | Discharge: 2021-10-31 | Payer: MEDICARE

## 2021-10-30 MED ORDER — OXYCODONE 15 MG TABLET
ORAL_TABLET | ORAL | 0 refills | 5 days | Status: CP | PRN
Start: 2021-10-30 — End: 2021-11-06

## 2021-11-01 DIAGNOSIS — S31109A Unspecified open wound of abdominal wall, unspecified quadrant without penetration into peritoneal cavity, initial encounter: Principal | ICD-10-CM

## 2021-11-01 DIAGNOSIS — L732 Hidradenitis suppurativa: Principal | ICD-10-CM

## 2021-11-01 MED ORDER — AMOXICILLIN 875 MG-POTASSIUM CLAVULANATE 125 MG TABLET
ORAL_TABLET | Freq: Two times a day (BID) | ORAL | 0 refills | 10 days | Status: CP
Start: 2021-11-01 — End: ?

## 2021-11-06 ENCOUNTER — Ambulatory Visit: Admit: 2021-11-06 | Discharge: 2021-11-30 | Payer: MEDICARE

## 2021-11-06 ENCOUNTER — Ambulatory Visit
Admit: 2021-11-06 | Discharge: 2021-11-06 | Discharge status: Against Medical Advice | Payer: MEDICARE | Attending: Emergency Medicine

## 2021-11-06 ENCOUNTER — Ambulatory Visit: Admit: 2021-11-06 | Payer: MEDICARE

## 2021-11-06 DIAGNOSIS — L732 Hidradenitis suppurativa: Principal | ICD-10-CM

## 2021-11-06 DIAGNOSIS — S31109A Unspecified open wound of abdominal wall, unspecified quadrant without penetration into peritoneal cavity, initial encounter: Principal | ICD-10-CM

## 2021-11-06 DIAGNOSIS — D84821 Immunodeficiency due to drugs (CODE) (CMS-HCC): Principal | ICD-10-CM

## 2021-11-06 DIAGNOSIS — R059 Cough in adult: Principal | ICD-10-CM

## 2021-11-06 MED ORDER — OXYCODONE 15 MG TABLET
ORAL_TABLET | ORAL | 0 refills | 5 days | Status: CP | PRN
Start: 2021-11-06 — End: 2021-11-13

## 2021-11-06 MED ORDER — AMOXICILLIN 875 MG-POTASSIUM CLAVULANATE 125 MG TABLET
ORAL_TABLET | Freq: Two times a day (BID) | ORAL | 0 refills | 14 days | Status: CP
Start: 2021-11-06 — End: 2021-11-20

## 2021-11-13 MED ORDER — OXYCODONE 15 MG TABLET
ORAL_TABLET | ORAL | 0 refills | 5 days | Status: CP | PRN
Start: 2021-11-13 — End: 2021-11-20

## 2021-11-20 DIAGNOSIS — L732 Hidradenitis suppurativa: Principal | ICD-10-CM

## 2021-11-20 DIAGNOSIS — N5082 Scrotal pain: Principal | ICD-10-CM

## 2021-11-20 DIAGNOSIS — S31109A Unspecified open wound of abdominal wall, unspecified quadrant without penetration into peritoneal cavity, initial encounter: Principal | ICD-10-CM

## 2021-11-20 DIAGNOSIS — N492 Inflammatory disorders of scrotum: Principal | ICD-10-CM

## 2021-11-20 DIAGNOSIS — D84821 Immunodeficiency due to drugs (CODE) (CMS-HCC): Principal | ICD-10-CM

## 2021-11-20 MED ORDER — AMOXICILLIN 875 MG-POTASSIUM CLAVULANATE 125 MG TABLET
ORAL_TABLET | Freq: Two times a day (BID) | ORAL | 0 refills | 14 days | Status: CP
Start: 2021-11-20 — End: 2021-12-04

## 2021-11-20 MED ORDER — OXYCODONE 15 MG TABLET
ORAL_TABLET | ORAL | 0 refills | 5 days | Status: CP | PRN
Start: 2021-11-20 — End: 2021-11-27

## 2021-11-20 MED ORDER — PREDNISONE 20 MG TABLET
ORAL_TABLET | ORAL | 0 refills | 9 days | Status: CP
Start: 2021-11-20 — End: 2021-11-29

## 2021-11-27 MED ORDER — OXYCODONE 15 MG TABLET
ORAL_TABLET | ORAL | 0 refills | 5 days | Status: CP | PRN
Start: 2021-11-27 — End: 2021-12-04

## 2021-12-04 DIAGNOSIS — L732 Hidradenitis suppurativa: Principal | ICD-10-CM

## 2021-12-04 DIAGNOSIS — S31109A Unspecified open wound of abdominal wall, unspecified quadrant without penetration into peritoneal cavity, initial encounter: Principal | ICD-10-CM

## 2021-12-04 MED ORDER — AMOXICILLIN 875 MG-POTASSIUM CLAVULANATE 125 MG TABLET
ORAL_TABLET | Freq: Two times a day (BID) | ORAL | 0 refills | 14 days | Status: CP
Start: 2021-12-04 — End: 2021-12-18

## 2021-12-04 MED ORDER — OXYCODONE 15 MG TABLET
ORAL_TABLET | ORAL | 0 refills | 5 days | Status: CP | PRN
Start: 2021-12-04 — End: 2021-12-11

## 2021-12-11 ENCOUNTER — Ambulatory Visit: Admit: 2021-12-11 | Payer: MEDICARE

## 2021-12-11 DIAGNOSIS — L732 Hidradenitis suppurativa: Principal | ICD-10-CM

## 2021-12-11 DIAGNOSIS — N492 Inflammatory disorders of scrotum: Principal | ICD-10-CM

## 2021-12-11 DIAGNOSIS — D84821 Immunodeficiency due to drugs (CODE) (CMS-HCC): Principal | ICD-10-CM

## 2021-12-11 MED ORDER — OXYCODONE 15 MG TABLET
ORAL_TABLET | ORAL | 0 refills | 5 days | Status: CP | PRN
Start: 2021-12-11 — End: 2021-12-18

## 2021-12-12 ENCOUNTER — Ambulatory Visit: Admit: 2021-12-12 | Discharge: 2021-12-13 | Payer: MEDICARE | Attending: Urology | Primary: Urology

## 2021-12-12 DIAGNOSIS — L732 Hidradenitis suppurativa: Principal | ICD-10-CM

## 2021-12-12 DIAGNOSIS — S31109A Unspecified open wound of abdominal wall, unspecified quadrant without penetration into peritoneal cavity, initial encounter: Principal | ICD-10-CM

## 2021-12-18 MED ORDER — OXYCODONE 15 MG TABLET
ORAL_TABLET | ORAL | 0 refills | 5 days | Status: CP | PRN
Start: 2021-12-18 — End: 2021-12-25

## 2021-12-25 ENCOUNTER — Ambulatory Visit: Admit: 2021-12-25 | Discharge: 2021-12-26 | Payer: MEDICARE

## 2021-12-26 MED ORDER — OXYCODONE 15 MG TABLET
ORAL_TABLET | ORAL | 0 refills | 5 days | Status: CP | PRN
Start: 2021-12-26 — End: 2022-01-02

## 2022-01-01 ENCOUNTER — Ambulatory Visit: Admit: 2022-01-01 | Payer: MEDICARE

## 2022-01-01 ENCOUNTER — Ambulatory Visit: Admit: 2022-01-01 | Discharge: 2022-01-29 | Payer: MEDICARE

## 2022-01-01 DIAGNOSIS — N5082 Scrotal pain: Principal | ICD-10-CM

## 2022-01-01 DIAGNOSIS — R1030 Lower abdominal pain, unspecified: Principal | ICD-10-CM

## 2022-01-01 DIAGNOSIS — L732 Hidradenitis suppurativa: Principal | ICD-10-CM

## 2022-01-01 DIAGNOSIS — D84821 Immunodeficiency due to drugs (CODE) (CMS-HCC): Principal | ICD-10-CM

## 2022-01-01 DIAGNOSIS — N492 Inflammatory disorders of scrotum: Principal | ICD-10-CM

## 2022-01-01 DIAGNOSIS — S31109A Unspecified open wound of abdominal wall, unspecified quadrant without penetration into peritoneal cavity, initial encounter: Principal | ICD-10-CM

## 2022-01-01 DIAGNOSIS — G8929 Other chronic pain: Principal | ICD-10-CM

## 2022-01-01 MED ORDER — AMOXICILLIN 875 MG-POTASSIUM CLAVULANATE 125 MG TABLET
ORAL_TABLET | Freq: Two times a day (BID) | ORAL | 0 refills | 14 days | Status: CP
Start: 2022-01-01 — End: 2022-01-15

## 2022-01-01 MED ORDER — OXYCODONE 15 MG TABLET
ORAL_TABLET | ORAL | 0 refills | 5 days | Status: CP | PRN
Start: 2022-01-01 — End: 2022-01-08

## 2022-01-08 MED ORDER — OXYCODONE 15 MG TABLET
ORAL_TABLET | ORAL | 0 refills | 5 days | Status: CP | PRN
Start: 2022-01-08 — End: 2022-01-15

## 2022-01-13 MED ORDER — DAPSONE 100 MG TABLET
ORAL_TABLET | 2 refills | 0 days
Start: 2022-01-13 — End: ?

## 2022-01-15 MED ORDER — DAPSONE 100 MG TABLET
ORAL_TABLET | 2 refills | 0 days | Status: CP
Start: 2022-01-15 — End: ?

## 2022-03-05 ENCOUNTER — Ambulatory Visit: Admit: 2022-03-05 | Payer: MEDICARE

## 2022-03-12 ENCOUNTER — Ambulatory Visit: Admit: 2022-03-12 | Payer: MEDICARE

## 2022-03-19 ENCOUNTER — Ambulatory Visit: Admit: 2022-03-19 | Payer: MEDICARE

## 2022-03-19 DIAGNOSIS — S31109D Unspecified open wound of abdominal wall, unspecified quadrant without penetration into peritoneal cavity, subsequent encounter: Principal | ICD-10-CM

## 2022-03-19 DIAGNOSIS — N492 Inflammatory disorders of scrotum: Principal | ICD-10-CM

## 2022-03-19 DIAGNOSIS — L732 Hidradenitis suppurativa: Principal | ICD-10-CM

## 2022-03-19 DIAGNOSIS — Z882 Allergy status to sulfonamides status: Principal | ICD-10-CM

## 2022-03-19 DIAGNOSIS — L02214 Cutaneous abscess of groin: Principal | ICD-10-CM

## 2022-03-19 DIAGNOSIS — L0201 Cutaneous abscess of face: Principal | ICD-10-CM

## 2022-03-19 DIAGNOSIS — N5082 Scrotal pain: Principal | ICD-10-CM

## 2022-03-19 DIAGNOSIS — G8929 Other chronic pain: Principal | ICD-10-CM

## 2022-03-19 DIAGNOSIS — D84821 Immunodeficiency due to drugs (CODE) (CMS-HCC): Principal | ICD-10-CM

## 2022-03-19 DIAGNOSIS — R1030 Lower abdominal pain, unspecified: Principal | ICD-10-CM

## 2022-03-19 DIAGNOSIS — D649 Anemia, unspecified: Principal | ICD-10-CM

## 2022-03-19 MED ORDER — AMOXICILLIN 875 MG-POTASSIUM CLAVULANATE 125 MG TABLET
ORAL_TABLET | Freq: Two times a day (BID) | ORAL | 0 refills | 10 days | Status: CP
Start: 2022-03-19 — End: 2022-03-29

## 2022-04-26 ENCOUNTER — Ambulatory Visit: Admit: 2022-04-26 | Discharge: 2022-04-29 | Payer: MEDICARE

## 2022-04-26 ENCOUNTER — Ambulatory Visit: Admit: 2022-04-16 | Payer: MEDICARE

## 2022-04-26 DIAGNOSIS — N5082 Scrotal pain: Principal | ICD-10-CM

## 2022-04-26 DIAGNOSIS — R1030 Lower abdominal pain, unspecified: Principal | ICD-10-CM

## 2022-04-26 DIAGNOSIS — D649 Anemia, unspecified: Principal | ICD-10-CM

## 2022-04-26 DIAGNOSIS — D84821 Immunodeficiency due to drugs: Principal | ICD-10-CM

## 2022-04-26 DIAGNOSIS — N492 Inflammatory disorders of scrotum: Principal | ICD-10-CM

## 2022-04-26 DIAGNOSIS — S31109D Unspecified open wound of abdominal wall, unspecified quadrant without penetration into peritoneal cavity, subsequent encounter: Principal | ICD-10-CM

## 2022-04-26 DIAGNOSIS — Z882 Allergy status to sulfonamides status: Principal | ICD-10-CM

## 2022-04-26 DIAGNOSIS — L02214 Cutaneous abscess of groin: Principal | ICD-10-CM

## 2022-04-26 DIAGNOSIS — L732 Hidradenitis suppurativa: Principal | ICD-10-CM

## 2022-04-26 DIAGNOSIS — G8929 Other chronic pain: Principal | ICD-10-CM

## 2022-04-26 MED ORDER — CLINDAMYCIN HCL 300 MG CAPSULE
ORAL_CAPSULE | Freq: Two times a day (BID) | ORAL | 3 refills | 30.00000 days | Status: CP
Start: 2022-04-26 — End: 2022-04-26

## 2022-05-25 ENCOUNTER — Ambulatory Visit: Admit: 2022-05-25 | Discharge: 2022-05-29 | Payer: MEDICARE

## 2022-05-25 DIAGNOSIS — N5082 Scrotal pain: Principal | ICD-10-CM

## 2022-05-25 DIAGNOSIS — G8929 Other chronic pain: Principal | ICD-10-CM

## 2022-05-25 DIAGNOSIS — N492 Inflammatory disorders of scrotum: Principal | ICD-10-CM

## 2022-05-25 DIAGNOSIS — R1030 Lower abdominal pain, unspecified: Principal | ICD-10-CM

## 2022-05-25 DIAGNOSIS — D84821 Immunodeficiency due to drugs: Principal | ICD-10-CM

## 2022-05-25 DIAGNOSIS — S31109D Unspecified open wound of abdominal wall, unspecified quadrant without penetration into peritoneal cavity, subsequent encounter: Principal | ICD-10-CM

## 2022-05-25 DIAGNOSIS — L02214 Cutaneous abscess of groin: Principal | ICD-10-CM

## 2022-05-25 DIAGNOSIS — Z882 Allergy status to sulfonamides status: Principal | ICD-10-CM

## 2022-05-25 DIAGNOSIS — L732 Hidradenitis suppurativa: Principal | ICD-10-CM

## 2022-05-25 DIAGNOSIS — D649 Anemia, unspecified: Principal | ICD-10-CM

## 2022-05-25 MED ORDER — RIFAMPIN 300 MG CAPSULE
ORAL_CAPSULE | Freq: Two times a day (BID) | ORAL | 3 refills | 30 days | Status: CP
Start: 2022-05-25 — End: 2022-09-22

## 2022-05-25 MED ORDER — CLINDAMYCIN HCL 300 MG CAPSULE
ORAL_CAPSULE | Freq: Two times a day (BID) | ORAL | 3 refills | 30 days | Status: CP
Start: 2022-05-25 — End: 2022-09-22

## 2022-06-21 ENCOUNTER — Ambulatory Visit: Admit: 2022-06-21 | Payer: MEDICARE

## 2022-06-21 ENCOUNTER — Encounter: Admit: 2022-06-21 | Payer: MEDICARE | Attending: Student in an Organized Health Care Education/Training Program

## 2022-06-21 ENCOUNTER — Encounter: Admit: 2022-06-21 | Payer: MEDICARE

## 2022-06-21 ENCOUNTER — Encounter
Admit: 2022-06-21 | Discharge: 2022-07-06 | Disposition: A | Discharge status: Skilled Nursing Facility | Payer: MEDICARE

## 2022-06-21 ENCOUNTER — Ambulatory Visit
Admit: 2022-06-21 | Discharge: 2022-07-06 | Disposition: A | Discharge status: Skilled Nursing Facility | Payer: MEDICARE

## 2022-06-22 DIAGNOSIS — N492 Inflammatory disorders of scrotum: Principal | ICD-10-CM

## 2022-07-06 MED ORDER — OXYCODONE ER 40 MG TABLET,CRUSH RESISTANT,EXTENDED RELEASE 12 HR
ORAL_TABLET | Freq: Two times a day (BID) | ORAL | 0 refills | 30 days | Status: CP
Start: 2022-07-06 — End: 2022-08-05

## 2022-07-06 MED ORDER — OXYCODONE 15 MG TABLET
ORAL_TABLET | ORAL | 0 refills | 4 days | Status: CP | PRN
Start: 2022-07-06 — End: 2022-07-11

## 2022-07-07 MED ORDER — SERTRALINE 50 MG TABLET
ORAL_TABLET | Freq: Every day | ORAL | 0 refills | 30 days | Status: CP
Start: 2022-07-07 — End: 2022-08-06

## 2022-07-07 MED ORDER — ERTAPENEM IVPB 1G 100ML CONNECTOR BAG
INTRAVENOUS | 0 refills | 26 days | Status: CP
Start: 2022-07-07 — End: 2022-08-02

## 2022-07-20 ENCOUNTER — Emergency Department
Admit: 2022-07-20 | Discharge: 2022-07-21 | Disposition: A | Discharge status: Nursing Home (Includes ICF) | Payer: MEDICARE | Attending: Emergency Medicine

## 2022-07-20 ENCOUNTER — Ambulatory Visit
Admit: 2022-07-20 | Discharge: 2022-07-21 | Disposition: A | Discharge status: Nursing Home (Includes ICF) | Payer: MEDICARE | Attending: Emergency Medicine

## 2022-07-20 DIAGNOSIS — G894 Chronic pain syndrome: Principal | ICD-10-CM

## 2022-07-20 DIAGNOSIS — L732 Hidradenitis suppurativa: Principal | ICD-10-CM

## 2022-07-30 ENCOUNTER — Ambulatory Visit: Admit: 2022-07-30 | Payer: MEDICARE

## 2022-07-30 ENCOUNTER — Ambulatory Visit: Admit: 2022-07-30 | Discharge: 2022-08-27 | Payer: MEDICARE

## 2022-07-30 DIAGNOSIS — L732 Hidradenitis suppurativa: Principal | ICD-10-CM

## 2022-07-30 DIAGNOSIS — Z792 Long term (current) use of antibiotics: Principal | ICD-10-CM

## 2022-07-30 DIAGNOSIS — Z882 Allergy status to sulfonamides status: Principal | ICD-10-CM

## 2022-07-30 DIAGNOSIS — N5082 Scrotal pain: Principal | ICD-10-CM

## 2022-07-30 DIAGNOSIS — N492 Inflammatory disorders of scrotum: Principal | ICD-10-CM

## 2022-07-30 DIAGNOSIS — D649 Anemia, unspecified: Principal | ICD-10-CM

## 2022-08-07 ENCOUNTER — Ambulatory Visit: Admit: 2022-08-07 | Payer: MEDICARE

## 2022-08-23 ENCOUNTER — Ambulatory Visit: Admit: 2022-08-23 | Discharge: 2022-08-24 | Payer: MEDICARE

## 2022-08-23 DIAGNOSIS — N492 Inflammatory disorders of scrotum: Principal | ICD-10-CM

## 2022-08-23 DIAGNOSIS — Z882 Allergy status to sulfonamides status: Principal | ICD-10-CM

## 2022-08-23 DIAGNOSIS — N5082 Scrotal pain: Principal | ICD-10-CM

## 2022-08-23 DIAGNOSIS — L732 Hidradenitis suppurativa: Principal | ICD-10-CM

## 2022-08-23 DIAGNOSIS — D649 Anemia, unspecified: Principal | ICD-10-CM

## 2022-08-23 DIAGNOSIS — Z792 Long term (current) use of antibiotics: Principal | ICD-10-CM

## 2022-08-23 MED ORDER — OXYCODONE 15 MG TABLET
ORAL_TABLET | ORAL | 0 refills | 5 days | Status: CP | PRN
Start: 2022-08-23 — End: 2022-08-30

## 2022-08-23 MED ORDER — CLINDAMYCIN HCL 300 MG CAPSULE
ORAL_CAPSULE | Freq: Two times a day (BID) | ORAL | 1 refills | 30 days | Status: CP
Start: 2022-08-23 — End: 2022-10-22

## 2022-08-23 MED ORDER — AMOXICILLIN 875 MG TABLET
ORAL_TABLET | Freq: Two times a day (BID) | ORAL | 1 refills | 30 days | Status: CP
Start: 2022-08-23 — End: 2022-10-22

## 2022-08-23 MED ORDER — OXYCODONE ER 40 MG TABLET,CRUSH RESISTANT,EXTENDED RELEASE 12 HR
ORAL_TABLET | Freq: Two times a day (BID) | ORAL | 0 refills | 7 days | Status: CP
Start: 2022-08-23 — End: 2022-08-30

## 2022-08-24 MED ORDER — OXYCODONE ER 40 MG TABLET,CRUSH RESISTANT,EXTENDED RELEASE 12 HR
ORAL_TABLET | Freq: Two times a day (BID) | ORAL | 0 refills | 7 days | Status: CP
Start: 2022-08-24 — End: 2022-08-31

## 2022-08-24 MED ORDER — OXYCODONE 15 MG TABLET
ORAL_TABLET | ORAL | 0 refills | 5 days | Status: CP | PRN
Start: 2022-08-24 — End: 2022-08-31

## 2022-08-30 MED ORDER — OXYCODONE ER 40 MG TABLET,CRUSH RESISTANT,EXTENDED RELEASE 12 HR
ORAL_TABLET | Freq: Two times a day (BID) | ORAL | 0 refills | 7 days | Status: CP
Start: 2022-08-30 — End: 2022-09-06

## 2022-08-30 MED ORDER — OXYCODONE 15 MG TABLET
ORAL_TABLET | ORAL | 0 refills | 5 days | Status: CP | PRN
Start: 2022-08-30 — End: 2022-09-06

## 2022-09-06 MED ORDER — OXYCODONE 15 MG TABLET
ORAL_TABLET | ORAL | 0 refills | 5 days | Status: CP | PRN
Start: 2022-09-06 — End: 2022-09-13

## 2022-09-06 MED ORDER — OXYCODONE ER 40 MG TABLET,CRUSH RESISTANT,EXTENDED RELEASE 12 HR
ORAL_TABLET | Freq: Two times a day (BID) | ORAL | 0 refills | 7 days | Status: CP
Start: 2022-09-06 — End: 2022-09-13

## 2022-09-13 ENCOUNTER — Ambulatory Visit: Admit: 2022-09-13 | Payer: MEDICARE

## 2022-09-13 DIAGNOSIS — N492 Inflammatory disorders of scrotum: Principal | ICD-10-CM

## 2022-09-13 DIAGNOSIS — N5082 Scrotal pain: Principal | ICD-10-CM

## 2022-09-13 DIAGNOSIS — D649 Anemia, unspecified: Principal | ICD-10-CM

## 2022-09-13 DIAGNOSIS — Z792 Long term (current) use of antibiotics: Principal | ICD-10-CM

## 2022-09-13 DIAGNOSIS — L732 Hidradenitis suppurativa: Principal | ICD-10-CM

## 2022-09-13 DIAGNOSIS — Z882 Allergy status to sulfonamides status: Principal | ICD-10-CM

## 2022-09-14 MED ORDER — OXYCODONE 15 MG TABLET
ORAL_TABLET | ORAL | 0 refills | 4 days | Status: CP | PRN
Start: 2022-09-14 — End: 2022-09-18

## 2022-09-14 MED ORDER — OXYCODONE ER 40 MG TABLET,CRUSH RESISTANT,EXTENDED RELEASE 12 HR
ORAL_TABLET | Freq: Two times a day (BID) | ORAL | 0 refills | 4 days | Status: CP
Start: 2022-09-14 — End: 2022-09-18

## 2022-10-18 ENCOUNTER — Ambulatory Visit: Admit: 2022-10-18 | Payer: MEDICARE

## 2022-10-18 ENCOUNTER — Ambulatory Visit: Admit: 2022-10-18 | Discharge: 2022-10-19 | Payer: MEDICARE

## 2022-10-18 DIAGNOSIS — Z792 Long term (current) use of antibiotics: Principal | ICD-10-CM

## 2022-10-18 DIAGNOSIS — D649 Anemia, unspecified: Principal | ICD-10-CM

## 2022-10-18 DIAGNOSIS — N492 Inflammatory disorders of scrotum: Principal | ICD-10-CM

## 2022-10-18 DIAGNOSIS — N5082 Scrotal pain: Principal | ICD-10-CM

## 2022-10-18 DIAGNOSIS — Z882 Allergy status to sulfonamides status: Principal | ICD-10-CM

## 2022-10-18 DIAGNOSIS — L732 Hidradenitis suppurativa: Principal | ICD-10-CM

## 2022-10-18 DIAGNOSIS — D84821 Immunodeficiency due to drugs (CODE) (CMS-HCC): Principal | ICD-10-CM

## 2022-11-10 ENCOUNTER — Ambulatory Visit: Admit: 2022-11-10 | Payer: MEDICARE

## 2022-11-10 ENCOUNTER — Ambulatory Visit
Admit: 2022-11-10 | Discharge: 2022-12-01 | Disposition: A | Discharge status: Skilled Nursing Facility | Payer: MEDICARE

## 2022-11-29 MED ORDER — OXYCODONE 15 MG TABLET
ORAL_TABLET | ORAL | 0 refills | 2 days | PRN
Start: 2022-11-29 — End: 2022-12-04

## 2022-12-01 MED ORDER — ALPRAZOLAM 0.5 MG TABLET
ORAL_TABLET | Freq: Four times a day (QID) | ORAL | 0 refills | 7.00000 days | Status: CP | PRN
Start: 2022-12-01 — End: 2022-12-01

## 2022-12-01 MED ORDER — OXYCODONE 15 MG TABLET
ORAL_TABLET | Freq: Four times a day (QID) | ORAL | 0 refills | 3 days | Status: CP | PRN
Start: 2022-12-01 — End: 2022-12-01

## 2022-12-01 MED ORDER — ACETAMINOPHEN 325 MG TABLET
ORAL_TABLET | ORAL | 0 refills | 3.00000 days | Status: CP | PRN
Start: 2022-12-01 — End: 2022-12-01

## 2022-12-01 MED ORDER — RIFAMPIN 300 MG CAPSULE
ORAL_CAPSULE | Freq: Two times a day (BID) | ORAL | 0 refills | 14 days | Status: CP
Start: 2022-12-01 — End: 2022-12-01

## 2022-12-01 MED ORDER — CLINDAMYCIN HCL 300 MG CAPSULE
ORAL_CAPSULE | Freq: Two times a day (BID) | ORAL | 0 refills | 14 days | Status: CP
Start: 2022-12-01 — End: 2022-12-01

## 2022-12-01 MED ORDER — GUAIFENESIN 100 MG/5 ML ORAL LIQUID
ORAL | 0 refills | 2.00000 days | Status: CP | PRN
Start: 2022-12-01 — End: 2022-12-01

## 2022-12-01 MED ORDER — OXYCODONE ER 40 MG TABLET,CRUSH RESISTANT,EXTENDED RELEASE 12 HR
ORAL_TABLET | Freq: Two times a day (BID) | ORAL | 0 refills | 10.00000 days | Status: CP
Start: 2022-12-01 — End: 2022-12-01

## 2022-12-02 MED ORDER — POLYETHYLENE GLYCOL 3350 17 GRAM ORAL POWDER PACKET
PACK | Freq: Every day | ORAL | 0 refills | 30.00000 days | Status: CP
Start: 2022-12-02 — End: 2023-01-01

## 2022-12-02 MED ORDER — FERROUS SULFATE 325 MG (65 MG IRON) TABLET
ORAL_TABLET | Freq: Every day | ORAL | 11 refills | 30.00000 days | Status: CP
Start: 2022-12-02 — End: 2023-12-02

## 2022-12-06 ENCOUNTER — Encounter: Admit: 2022-12-06 | Discharge: 2022-12-06 | Payer: MEDICARE

## 2022-12-06 DIAGNOSIS — L089 Local infection of the skin and subcutaneous tissue, unspecified: Principal | ICD-10-CM

## 2022-12-06 DIAGNOSIS — N492 Inflammatory disorders of scrotum: Principal | ICD-10-CM

## 2022-12-09 ENCOUNTER — Emergency Department
Admit: 2022-12-09 | Discharge: 2022-12-10 | Disposition: A | Discharge status: Nursing Home (Includes ICF) | Payer: MEDICARE | Attending: Emergency Medicine

## 2022-12-09 ENCOUNTER — Ambulatory Visit
Admit: 2022-12-09 | Discharge: 2022-12-10 | Disposition: A | Discharge status: Nursing Home (Includes ICF) | Payer: MEDICARE | Attending: Emergency Medicine

## 2022-12-10 MED ORDER — DOXYCYCLINE HYCLATE 100 MG CAPSULE
ORAL_CAPSULE | Freq: Two times a day (BID) | ORAL | 0 refills | 10 days | Status: CP
Start: 2022-12-10 — End: 2022-12-20

## 2022-12-10 MED ORDER — CLINDAMYCIN HCL 150 MG CAPSULE
ORAL_CAPSULE | Freq: Four times a day (QID) | ORAL | 0 refills | 7 days | Status: CP
Start: 2022-12-10 — End: 2022-12-17

## 2022-12-14 ENCOUNTER — Encounter: Admit: 2022-12-14 | Discharge: 2022-12-14 | Payer: MEDICARE

## 2022-12-14 DIAGNOSIS — R748 Abnormal levels of other serum enzymes: Principal | ICD-10-CM

## 2022-12-14 DIAGNOSIS — R7401 Elevation of levels of liver transaminase levels: Principal | ICD-10-CM

## 2022-12-20 ENCOUNTER — Ambulatory Visit: Admit: 2022-12-20 | Payer: MEDICARE

## 2022-12-27 ENCOUNTER — Ambulatory Visit: Admit: 2022-12-27

## 2022-12-27 ENCOUNTER — Ambulatory Visit: Admit: 2022-12-27 | Payer: MEDICARE

## 2022-12-27 DIAGNOSIS — S31109A Unspecified open wound of abdominal wall, unspecified quadrant without penetration into peritoneal cavity, initial encounter: Principal | ICD-10-CM

## 2022-12-27 DIAGNOSIS — D84821 Immunodeficiency due to drugs: Principal | ICD-10-CM

## 2022-12-27 DIAGNOSIS — Z882 Allergy status to sulfonamides status: Principal | ICD-10-CM

## 2022-12-27 DIAGNOSIS — S31829A Unspecified open wound of left buttock, initial encounter: Principal | ICD-10-CM

## 2022-12-27 DIAGNOSIS — L732 Hidradenitis suppurativa: Principal | ICD-10-CM

## 2022-12-27 MED ORDER — ALPRAZOLAM 0.5 MG TABLET
ORAL_TABLET | Freq: Four times a day (QID) | ORAL | 0 refills | 23 days | Status: CP | PRN
Start: 2022-12-27 — End: 2023-01-26

## 2023-02-08 ENCOUNTER — Ambulatory Visit: Admit: 2023-02-08 | Payer: MEDICARE

## 2023-02-08 DIAGNOSIS — D84821 Immunodeficiency due to drugs: Principal | ICD-10-CM

## 2023-02-08 DIAGNOSIS — Z882 Allergy status to sulfonamides status: Principal | ICD-10-CM

## 2023-02-08 DIAGNOSIS — L732 Hidradenitis suppurativa: Principal | ICD-10-CM

## 2023-02-08 MED ORDER — OXYCODONE 15 MG TABLET
ORAL_TABLET | Freq: Four times a day (QID) | ORAL | 0 refills | 6 days | Status: CP | PRN
Start: 2023-02-08 — End: ?

## 2023-02-08 MED ORDER — RIFAMPIN 300 MG CAPSULE
ORAL | 11 refills | 14 days | Status: CP
Start: 2023-02-08 — End: ?

## 2023-02-08 MED ORDER — OXYCODONE ER 40 MG TABLET,CRUSH RESISTANT,EXTENDED RELEASE 12 HR
ORAL_TABLET | Freq: Two times a day (BID) | ORAL | 0 refills | 10 days | Status: CP
Start: 2023-02-08 — End: ?

## 2023-02-08 MED ORDER — CLINDAMYCIN HCL 300 MG CAPSULE
ORAL_CAPSULE | Freq: Two times a day (BID) | ORAL | 11 refills | 14 days | Status: CP
Start: 2023-02-08 — End: ?

## 2023-02-18 MED ORDER — OXYCODONE ER 40 MG TABLET,CRUSH RESISTANT,EXTENDED RELEASE 12 HR
ORAL_TABLET | Freq: Two times a day (BID) | ORAL | 0 refills | 10 days | Status: CP
Start: 2023-02-18 — End: ?

## 2023-02-18 MED ORDER — OXYCODONE 15 MG TABLET
ORAL_TABLET | Freq: Four times a day (QID) | ORAL | 0 refills | 6 days | Status: CP | PRN
Start: 2023-02-18 — End: ?

## 2023-02-25 MED ORDER — OXYCODONE ER 40 MG TABLET,CRUSH RESISTANT,EXTENDED RELEASE 12 HR
ORAL_TABLET | Freq: Two times a day (BID) | ORAL | 0 refills | 10 days | Status: CP
Start: 2023-02-25 — End: ?

## 2023-02-25 MED ORDER — OXYCODONE 15 MG TABLET
ORAL_TABLET | Freq: Four times a day (QID) | ORAL | 0 refills | 6 days | Status: CP | PRN
Start: 2023-02-25 — End: ?

## 2023-03-07 MED ORDER — OXYCODONE ER 40 MG TABLET,CRUSH RESISTANT,EXTENDED RELEASE 12 HR
ORAL_TABLET | Freq: Two times a day (BID) | ORAL | 0 refills | 10 days | Status: CP
Start: 2023-03-07 — End: ?

## 2023-03-07 MED ORDER — OXYCODONE 15 MG TABLET
ORAL_TABLET | Freq: Four times a day (QID) | ORAL | 0 refills | 6 days | Status: CP | PRN
Start: 2023-03-07 — End: ?

## 2023-03-18 MED ORDER — OXYCODONE 15 MG TABLET
ORAL_TABLET | Freq: Four times a day (QID) | ORAL | 0 refills | 6 days | Status: CP | PRN
Start: 2023-03-18 — End: ?

## 2023-03-18 MED ORDER — OXYCODONE ER 40 MG TABLET,CRUSH RESISTANT,EXTENDED RELEASE 12 HR
ORAL_TABLET | Freq: Two times a day (BID) | ORAL | 0 refills | 10 days | Status: CP
Start: 2023-03-18 — End: ?

## 2023-03-19 ENCOUNTER — Ambulatory Visit: Admit: 2023-03-19 | Payer: MEDICARE

## 2023-03-19 DIAGNOSIS — L732 Hidradenitis suppurativa: Principal | ICD-10-CM

## 2023-03-19 DIAGNOSIS — S31829A Unspecified open wound of left buttock, initial encounter: Principal | ICD-10-CM

## 2023-03-19 DIAGNOSIS — N492 Inflammatory disorders of scrotum: Principal | ICD-10-CM

## 2023-03-19 DIAGNOSIS — S31109A Unspecified open wound of abdominal wall, unspecified quadrant without penetration into peritoneal cavity, initial encounter: Principal | ICD-10-CM

## 2023-03-19 DIAGNOSIS — D84821 Immunodeficiency due to drugs: Principal | ICD-10-CM

## 2023-03-19 DIAGNOSIS — Z882 Allergy status to sulfonamides status: Principal | ICD-10-CM

## 2023-04-01 MED ORDER — OXYCODONE 15 MG TABLET
ORAL_TABLET | Freq: Four times a day (QID) | ORAL | 0 refills | 6 days | Status: CP | PRN
Start: 2023-04-01 — End: ?

## 2023-04-01 MED ORDER — OXYCODONE ER 40 MG TABLET,CRUSH RESISTANT,EXTENDED RELEASE 12 HR
ORAL_TABLET | Freq: Two times a day (BID) | ORAL | 0 refills | 10 days | Status: CP
Start: 2023-04-01 — End: ?

## 2023-04-01 MED ORDER — FOOD SUPPLEMENT, LACTOSE-REDUCED ORAL LIQUID
Freq: Three times a day (TID) | ORAL | 2 refills | 0 days | Status: CP
Start: 2023-04-01 — End: ?

## 2023-04-08 MED ORDER — OXYCODONE ER 40 MG TABLET,CRUSH RESISTANT,EXTENDED RELEASE 12 HR
ORAL_TABLET | Freq: Two times a day (BID) | ORAL | 0 refills | 10 days | Status: CP
Start: 2023-04-08 — End: ?

## 2023-04-08 MED ORDER — OXYCODONE 15 MG TABLET
ORAL_TABLET | Freq: Four times a day (QID) | ORAL | 0 refills | 6 days | Status: CP | PRN
Start: 2023-04-08 — End: ?

## 2023-04-16 MED ORDER — OXYCODONE ER 40 MG TABLET,CRUSH RESISTANT,EXTENDED RELEASE 12 HR
ORAL_TABLET | Freq: Two times a day (BID) | ORAL | 0 refills | 10 days | Status: CP
Start: 2023-04-16 — End: ?

## 2023-04-16 MED ORDER — OXYCODONE 15 MG TABLET
ORAL_TABLET | Freq: Four times a day (QID) | ORAL | 0 refills | 6 days | Status: CP | PRN
Start: 2023-04-16 — End: ?

## 2023-04-23 MED ORDER — OXYCODONE 15 MG TABLET
ORAL_TABLET | Freq: Four times a day (QID) | ORAL | 0 refills | 6 days | Status: CP | PRN
Start: 2023-04-23 — End: ?

## 2023-04-23 MED ORDER — OXYCODONE ER 40 MG TABLET,CRUSH RESISTANT,EXTENDED RELEASE 12 HR
ORAL_TABLET | Freq: Two times a day (BID) | ORAL | 0 refills | 10 days | Status: CP
Start: 2023-04-23 — End: ?

## 2023-04-29 ENCOUNTER — Ambulatory Visit: Admit: 2023-04-29 | Payer: MEDICARE

## 2023-04-29 DIAGNOSIS — F1190 Opioid use, unspecified, uncomplicated: Principal | ICD-10-CM

## 2023-04-29 DIAGNOSIS — D84821 Immunodeficiency due to drugs: Principal | ICD-10-CM

## 2023-04-29 DIAGNOSIS — G8929 Other chronic pain: Principal | ICD-10-CM

## 2023-04-29 DIAGNOSIS — L732 Hidradenitis suppurativa: Principal | ICD-10-CM

## 2023-04-29 DIAGNOSIS — R1030 Lower abdominal pain, unspecified: Principal | ICD-10-CM

## 2023-04-29 DIAGNOSIS — Z882 Allergy status to sulfonamides status: Principal | ICD-10-CM

## 2023-04-29 MED ORDER — OXYCODONE ER 40 MG TABLET,CRUSH RESISTANT,EXTENDED RELEASE 12 HR
ORAL_TABLET | Freq: Two times a day (BID) | ORAL | 0 refills | 10 days | Status: CP
Start: 2023-04-29 — End: ?

## 2023-04-29 MED ORDER — OXYCODONE 15 MG TABLET
ORAL_TABLET | Freq: Four times a day (QID) | ORAL | 0 refills | 6 days | Status: CP | PRN
Start: 2023-04-29 — End: ?

## 2023-05-06 MED ORDER — OXYCODONE 15 MG TABLET
ORAL_TABLET | Freq: Four times a day (QID) | ORAL | 0 refills | 6 days | Status: CP | PRN
Start: 2023-05-06 — End: ?

## 2023-05-06 MED ORDER — OXYCODONE ER 40 MG TABLET,CRUSH RESISTANT,EXTENDED RELEASE 12 HR
ORAL_TABLET | Freq: Two times a day (BID) | ORAL | 0 refills | 10 days | Status: CP
Start: 2023-05-06 — End: ?

## 2023-05-13 MED ORDER — OXYCODONE ER 40 MG TABLET,CRUSH RESISTANT,EXTENDED RELEASE 12 HR
ORAL_TABLET | Freq: Two times a day (BID) | ORAL | 0 refills | 10 days | Status: CP
Start: 2023-05-13 — End: ?

## 2023-05-13 MED ORDER — OXYCODONE 15 MG TABLET
ORAL_TABLET | Freq: Four times a day (QID) | ORAL | 0 refills | 6 days | Status: CP | PRN
Start: 2023-05-13 — End: ?

## 2023-05-21 MED ORDER — OXYCODONE 15 MG TABLET
ORAL_TABLET | Freq: Four times a day (QID) | ORAL | 0 refills | 6 days | Status: CP | PRN
Start: 2023-05-21 — End: ?

## 2023-05-21 MED ORDER — OXYCODONE ER 40 MG TABLET,CRUSH RESISTANT,EXTENDED RELEASE 12 HR
ORAL_TABLET | Freq: Two times a day (BID) | ORAL | 0 refills | 10 days | Status: CP
Start: 2023-05-21 — End: ?

## 2023-05-28 ENCOUNTER — Ambulatory Visit: Admit: 2023-05-28 | Payer: MEDICARE

## 2023-05-28 DIAGNOSIS — Z882 Allergy status to sulfonamides status: Principal | ICD-10-CM

## 2023-05-28 DIAGNOSIS — L732 Hidradenitis suppurativa: Principal | ICD-10-CM

## 2023-05-28 DIAGNOSIS — D84821 Immunodeficiency due to drugs: Principal | ICD-10-CM

## 2023-05-28 MED ORDER — METRONIDAZOLE 500 MG TABLET
ORAL_TABLET | Freq: Three times a day (TID) | ORAL | 0 refills | 48 days | Status: CP
Start: 2023-05-28 — End: 2023-07-15

## 2023-05-28 MED ORDER — OXYCODONE 15 MG TABLET
ORAL_TABLET | Freq: Four times a day (QID) | ORAL | 0 refills | 6 days | Status: CP | PRN
Start: 2023-05-28 — End: ?

## 2023-05-28 MED ORDER — COLCHICINE 0.6 MG TABLET
ORAL_TABLET | Freq: Two times a day (BID) | ORAL | 2 refills | 15 days | Status: CP
Start: 2023-05-28 — End: 2024-05-27

## 2023-05-28 MED ORDER — OXYCODONE ER 40 MG TABLET,CRUSH RESISTANT,EXTENDED RELEASE 12 HR
ORAL_TABLET | Freq: Two times a day (BID) | ORAL | 0 refills | 10 days | Status: CP
Start: 2023-05-28 — End: ?

## 2023-05-28 MED ORDER — MOXIFLOXACIN 400 MG TABLET
ORAL_TABLET | Freq: Every day | ORAL | 0 refills | 30 days | Status: CP
Start: 2023-05-28 — End: 2023-06-27

## 2023-06-04 MED ORDER — OXYCODONE ER 40 MG TABLET,CRUSH RESISTANT,EXTENDED RELEASE 12 HR
ORAL_TABLET | Freq: Two times a day (BID) | ORAL | 0 refills | 10 days | Status: CP
Start: 2023-06-04 — End: ?

## 2023-06-04 MED ORDER — OXYCODONE 15 MG TABLET
ORAL_TABLET | Freq: Four times a day (QID) | ORAL | 0 refills | 6 days | Status: CP | PRN
Start: 2023-06-04 — End: ?

## 2023-06-11 DIAGNOSIS — L732 Hidradenitis suppurativa: Principal | ICD-10-CM

## 2023-06-11 MED ORDER — OXYCODONE ER 40 MG TABLET,CRUSH RESISTANT,EXTENDED RELEASE 12 HR
ORAL_TABLET | Freq: Two times a day (BID) | ORAL | 0 refills | 10 days | Status: CP
Start: 2023-06-11 — End: ?

## 2023-06-11 MED ORDER — RIFAMPIN 300 MG CAPSULE
ORAL_CAPSULE | Freq: Two times a day (BID) | ORAL | 11 refills | 14 days | Status: CP
Start: 2023-06-11 — End: ?

## 2023-06-11 MED ORDER — OXYCODONE 15 MG TABLET
ORAL_TABLET | Freq: Four times a day (QID) | ORAL | 0 refills | 6 days | Status: CP | PRN
Start: 2023-06-11 — End: ?

## 2023-06-19 MED ORDER — OXYCODONE 15 MG TABLET
ORAL_TABLET | Freq: Three times a day (TID) | ORAL | 0 refills | 4 days | Status: CP | PRN
Start: 2023-06-19 — End: ?

## 2023-06-24 MED ORDER — OXYCODONE 15 MG TABLET
ORAL_TABLET | Freq: Three times a day (TID) | ORAL | 0 refills | 4 days | Status: CP | PRN
Start: 2023-06-24 — End: ?

## 2023-06-24 MED ORDER — OXYCODONE ER 40 MG TABLET,CRUSH RESISTANT,EXTENDED RELEASE 12 HR
ORAL_TABLET | Freq: Two times a day (BID) | ORAL | 0 refills | 10 days | Status: CP
Start: 2023-06-24 — End: ?

## 2023-06-26 ENCOUNTER — Ambulatory Visit: Admit: 2023-06-26 | Discharge: 2023-07-23 | Payer: MEDICARE

## 2023-06-26 ENCOUNTER — Ambulatory Visit: Admit: 2023-06-26 | Payer: MEDICARE

## 2023-06-26 DIAGNOSIS — L732 Hidradenitis suppurativa: Principal | ICD-10-CM

## 2023-06-26 DIAGNOSIS — R1030 Lower abdominal pain, unspecified: Principal | ICD-10-CM

## 2023-06-26 DIAGNOSIS — D84821 Immunodeficiency due to drugs: Principal | ICD-10-CM

## 2023-06-26 DIAGNOSIS — Z882 Allergy status to sulfonamides status: Principal | ICD-10-CM

## 2023-06-26 DIAGNOSIS — N492 Inflammatory disorders of scrotum: Principal | ICD-10-CM

## 2023-06-26 DIAGNOSIS — G8929 Other chronic pain: Principal | ICD-10-CM

## 2023-06-26 DIAGNOSIS — F1190 Opioid use, unspecified, uncomplicated: Principal | ICD-10-CM

## 2023-06-26 MED ORDER — CLINDAMYCIN HCL 300 MG CAPSULE
ORAL_CAPSULE | Freq: Two times a day (BID) | ORAL | 6 refills | 30 days | Status: CP
Start: 2023-06-26 — End: ?

## 2023-06-26 MED ORDER — OXYCODONE 15 MG TABLET
ORAL_TABLET | Freq: Three times a day (TID) | ORAL | 0 refills | 8 days | Status: CP | PRN
Start: 2023-06-26 — End: ?

## 2023-07-03 DIAGNOSIS — L732 Hidradenitis suppurativa: Principal | ICD-10-CM

## 2023-07-03 MED ORDER — RIFAMPIN 300 MG CAPSULE
ORAL_CAPSULE | Freq: Two times a day (BID) | ORAL | 11 refills | 14 days | Status: CP
Start: 2023-07-03 — End: ?

## 2023-07-04 MED ORDER — OXYCODONE 15 MG TABLET
ORAL_TABLET | Freq: Three times a day (TID) | ORAL | 0 refills | 8.00 days | Status: CP | PRN
Start: 2023-07-04 — End: ?

## 2023-07-04 MED ORDER — OXYCODONE ER 40 MG TABLET,CRUSH RESISTANT,EXTENDED RELEASE 12 HR
ORAL_TABLET | Freq: Two times a day (BID) | ORAL | 0 refills | 10.00 days | Status: CP
Start: 2023-07-04 — End: ?

## 2023-07-09 MED ORDER — OXYCODONE 15 MG TABLET
ORAL_TABLET | Freq: Three times a day (TID) | ORAL | 0 refills | 8.00 days | Status: CP | PRN
Start: 2023-07-09 — End: ?

## 2023-07-09 MED ORDER — OXYCODONE ER 40 MG TABLET,CRUSH RESISTANT,EXTENDED RELEASE 12 HR
ORAL_TABLET | Freq: Two times a day (BID) | ORAL | 0 refills | 10.00 days | Status: CP
Start: 2023-07-09 — End: ?

## 2023-07-22 MED ORDER — OXYCODONE 15 MG TABLET
ORAL_TABLET | Freq: Three times a day (TID) | ORAL | 0 refills | 8.00 days | Status: CP | PRN
Start: 2023-07-22 — End: ?

## 2023-07-22 MED ORDER — OXYCODONE ER 40 MG TABLET,CRUSH RESISTANT,EXTENDED RELEASE 12 HR
ORAL_TABLET | Freq: Two times a day (BID) | ORAL | 0 refills | 10.00 days | Status: CP
Start: 2023-07-22 — End: ?

## 2023-07-31 ENCOUNTER — Ambulatory Visit: Admit: 2023-07-31 | Payer: MEDICARE

## 2023-07-31 DIAGNOSIS — Z882 Allergy status to sulfonamides status: Principal | ICD-10-CM

## 2023-07-31 DIAGNOSIS — F1190 Opioid use, unspecified, uncomplicated: Principal | ICD-10-CM

## 2023-07-31 DIAGNOSIS — D84821 Immunodeficiency due to drugs: Principal | ICD-10-CM

## 2023-07-31 DIAGNOSIS — L732 Hidradenitis suppurativa: Principal | ICD-10-CM

## 2023-07-31 MED ORDER — OXYCODONE 15 MG TABLET
ORAL_TABLET | Freq: Three times a day (TID) | ORAL | 0 refills | 8.00 days | Status: CP | PRN
Start: 2023-07-31 — End: ?

## 2023-07-31 MED ORDER — OXYCODONE ER 40 MG TABLET,CRUSH RESISTANT,EXTENDED RELEASE 12 HR
ORAL_TABLET | Freq: Two times a day (BID) | ORAL | 0 refills | 10.00 days | Status: CP
Start: 2023-07-31 — End: ?

## 2023-08-07 MED ORDER — OXYCODONE ER 20 MG TABLET,CRUSH RESISTANT,EXTENDED RELEASE 12 HR
ORAL_TABLET | Freq: Two times a day (BID) | ORAL | 0 refills | 30.00 days | Status: CP
Start: 2023-08-07 — End: ?

## 2023-08-07 MED ORDER — OXYCODONE 15 MG TABLET
ORAL_TABLET | Freq: Two times a day (BID) | ORAL | 0 refills | 12.00 days | Status: CP
Start: 2023-08-07 — End: ?

## 2023-08-21 MED ORDER — OXYCODONE 15 MG TABLET
ORAL_TABLET | Freq: Two times a day (BID) | ORAL | 0 refills | 12.00 days | Status: CP
Start: 2023-08-21 — End: ?

## 2023-09-04 ENCOUNTER — Ambulatory Visit: Admit: 2023-09-04 | Payer: MEDICARE

## 2023-09-04 DIAGNOSIS — F1190 Opioid use, unspecified, uncomplicated: Principal | ICD-10-CM

## 2023-09-04 DIAGNOSIS — L732 Hidradenitis suppurativa: Principal | ICD-10-CM

## 2023-09-04 DIAGNOSIS — Z882 Allergy status to sulfonamides status: Principal | ICD-10-CM

## 2023-09-04 DIAGNOSIS — R1030 Lower abdominal pain, unspecified: Principal | ICD-10-CM

## 2023-09-04 DIAGNOSIS — G8929 Other chronic pain: Principal | ICD-10-CM

## 2023-09-04 DIAGNOSIS — D84821 Immunodeficiency due to drugs: Principal | ICD-10-CM

## 2023-09-04 MED ORDER — OXYCODONE ER 20 MG TABLET,CRUSH RESISTANT,EXTENDED RELEASE 12 HR
ORAL_TABLET | Freq: Two times a day (BID) | ORAL | 0 refills | 30.00 days | Status: CP
Start: 2023-09-04 — End: ?

## 2023-09-04 MED ORDER — OXYCODONE 15 MG TABLET
ORAL_TABLET | Freq: Two times a day (BID) | ORAL | 0 refills | 12.00 days | Status: CP
Start: 2023-09-04 — End: ?

## 2023-09-09 MED ORDER — OXYCODONE 15 MG TABLET
ORAL_TABLET | Freq: Two times a day (BID) | ORAL | 0 refills | 12.00 days | Status: CP
Start: 2023-09-09 — End: ?

## 2023-09-24 MED ORDER — OXYCODONE ER 20 MG TABLET,CRUSH RESISTANT,EXTENDED RELEASE 12 HR
ORAL_TABLET | Freq: Two times a day (BID) | ORAL | 0 refills | 30.00 days | Status: CP
Start: 2023-09-24 — End: ?

## 2023-09-24 MED ORDER — OXYCODONE 15 MG TABLET
ORAL_TABLET | Freq: Two times a day (BID) | ORAL | 0 refills | 12.00 days | Status: CP
Start: 2023-09-24 — End: ?

## 2023-10-09 MED ORDER — OXYCODONE 15 MG TABLET
ORAL_TABLET | Freq: Two times a day (BID) | ORAL | 0 refills | 12.00 days | Status: CP
Start: 2023-10-09 — End: ?

## 2023-10-09 MED ORDER — OXYCODONE ER 20 MG TABLET,CRUSH RESISTANT,EXTENDED RELEASE 12 HR
ORAL_TABLET | Freq: Two times a day (BID) | ORAL | 0 refills | 5.00 days | Status: CP
Start: 2023-10-09 — End: ?

## 2023-10-14 ENCOUNTER — Ambulatory Visit: Admit: 2023-10-14 | Payer: MEDICARE

## 2023-10-21 ENCOUNTER — Ambulatory Visit: Admit: 2023-10-21 | Payer: MEDICARE

## 2023-10-21 ENCOUNTER — Ambulatory Visit: Admit: 2023-10-21 | Discharge: 2023-10-21

## 2023-10-21 DIAGNOSIS — L732 Hidradenitis suppurativa: Principal | ICD-10-CM

## 2023-10-21 DIAGNOSIS — D84821 Immunodeficiency due to drugs: Principal | ICD-10-CM

## 2023-10-21 DIAGNOSIS — Z882 Allergy status to sulfonamides status: Principal | ICD-10-CM

## 2023-10-21 MED ORDER — METRONIDAZOLE 500 MG TABLET
ORAL_TABLET | Freq: Three times a day (TID) | ORAL | 0 refills | 42 days | Status: CP
Start: 2023-10-21 — End: 2023-12-02

## 2023-10-21 MED ORDER — MOXIFLOXACIN 400 MG TABLET
ORAL_TABLET | Freq: Every day | ORAL | 0 refills | 42 days | Status: CP
Start: 2023-10-21 — End: 2023-12-02

## 2023-11-18 ENCOUNTER — Ambulatory Visit: Admit: 2023-11-18 | Discharge: 2023-11-20 | Payer: MEDICARE

## 2023-11-27 ENCOUNTER — Ambulatory Visit: Admit: 2023-11-27 | Payer: MEDICARE

## 2023-11-27 DIAGNOSIS — N5082 Scrotal pain: Principal | ICD-10-CM

## 2023-11-27 DIAGNOSIS — Z882 Allergy status to sulfonamides status: Principal | ICD-10-CM

## 2023-11-27 DIAGNOSIS — N492 Inflammatory disorders of scrotum: Principal | ICD-10-CM

## 2023-11-27 DIAGNOSIS — L732 Hidradenitis suppurativa: Principal | ICD-10-CM

## 2023-11-27 DIAGNOSIS — D84821 Immunodeficiency due to drugs: Principal | ICD-10-CM

## 2023-11-27 DIAGNOSIS — G8929 Other chronic pain: Principal | ICD-10-CM

## 2023-11-27 DIAGNOSIS — R1030 Lower abdominal pain, unspecified: Principal | ICD-10-CM

## 2023-11-27 MED ORDER — CLINDAMYCIN HCL 300 MG CAPSULE
ORAL_CAPSULE | Freq: Two times a day (BID) | ORAL | 1 refills | 30.00000 days | Status: CP
Start: 2023-11-27 — End: 2024-01-26

## 2023-11-27 MED ORDER — RIFAMPIN 300 MG CAPSULE
ORAL_CAPSULE | Freq: Two times a day (BID) | ORAL | 1 refills | 30.00000 days | Status: CP
Start: 2023-11-27 — End: 2024-01-26

## 2023-11-27 MED ORDER — DAPSONE 5 % TOPICAL GEL
Freq: Two times a day (BID) | TOPICAL | 2 refills | 0.00000 days | Status: CP
Start: 2023-11-27 — End: 2023-12-11

## 2023-12-30 ENCOUNTER — Ambulatory Visit: Admit: 2023-12-30 | Payer: MEDICARE

## 2023-12-30 DIAGNOSIS — L732 Hidradenitis suppurativa: Principal | ICD-10-CM

## 2023-12-30 DIAGNOSIS — D84821 Immunodeficiency due to drugs: Principal | ICD-10-CM

## 2023-12-30 DIAGNOSIS — Z882 Allergy status to sulfonamides status: Principal | ICD-10-CM

## 2024-02-06 ENCOUNTER — Ambulatory Visit: Admit: 2024-02-06 | Payer: MEDICARE

## 2024-02-13 ENCOUNTER — Ambulatory Visit: Admit: 2024-02-13 | Discharge: 2024-02-18 | Payer: MEDICARE

## 2024-02-13 ENCOUNTER — Ambulatory Visit: Admit: 2024-02-13 | Payer: MEDICARE

## 2024-02-13 DIAGNOSIS — N492 Inflammatory disorders of scrotum: Principal | ICD-10-CM

## 2024-02-13 DIAGNOSIS — N5082 Scrotal pain: Principal | ICD-10-CM

## 2024-02-13 DIAGNOSIS — D84821 Immunodeficiency due to drugs: Principal | ICD-10-CM

## 2024-02-13 DIAGNOSIS — R1030 Lower abdominal pain, unspecified: Principal | ICD-10-CM

## 2024-02-13 DIAGNOSIS — L732 Hidradenitis suppurativa: Principal | ICD-10-CM

## 2024-02-13 DIAGNOSIS — Z882 Allergy status to sulfonamides status: Principal | ICD-10-CM

## 2024-02-13 DIAGNOSIS — G8929 Other chronic pain: Principal | ICD-10-CM

## 2024-02-13 MED ORDER — RIFAMPIN 300 MG CAPSULE
ORAL_CAPSULE | Freq: Two times a day (BID) | ORAL | 1 refills | 30.00000 days | Status: CP
Start: 2024-02-13 — End: 2024-04-13

## 2024-02-13 MED ORDER — CLINDAMYCIN HCL 300 MG CAPSULE
ORAL_CAPSULE | Freq: Two times a day (BID) | ORAL | 1 refills | 30.00000 days | Status: CP
Start: 2024-02-13 — End: 2024-04-13

## 2024-03-11 ENCOUNTER — Ambulatory Visit: Admit: 2024-03-11 | Payer: MEDICARE

## 2024-04-06 ENCOUNTER — Ambulatory Visit: Admit: 2024-04-06 | Payer: MEDICARE

## 2024-04-22 ENCOUNTER — Ambulatory Visit: Admit: 2024-04-22 | Payer: MEDICARE

## 2024-06-12 DIAGNOSIS — S41102A Unspecified open wound of left upper arm, initial encounter: Principal | ICD-10-CM

## 2024-06-12 DIAGNOSIS — S41101A Unspecified open wound of right upper arm, initial encounter: Principal | ICD-10-CM

## 2024-06-12 DIAGNOSIS — S31109A Unspecified open wound of abdominal wall, unspecified quadrant without penetration into peritoneal cavity, initial encounter: Principal | ICD-10-CM

## 2024-06-12 DIAGNOSIS — S31809A Unspecified open wound of unspecified buttock, initial encounter: Principal | ICD-10-CM

## 2024-08-03 ENCOUNTER — Ambulatory Visit: Admit: 2024-08-03 | Payer: MEDICARE

## 2024-08-19 ENCOUNTER — Ambulatory Visit: Admit: 2024-08-19 | Payer: MEDICARE
